# Patient Record
Sex: Female | Born: 1965
Health system: Southern US, Community
[De-identification: ages and names within clinical notes are randomized; demographics above are authoritative.]

## PROBLEM LIST (undated history)

## (undated) DIAGNOSIS — J309 Allergic rhinitis, unspecified: Secondary | ICD-10-CM

## (undated) DIAGNOSIS — R7303 Prediabetes: Secondary | ICD-10-CM

## (undated) DIAGNOSIS — E28319 Asymptomatic premature menopause: Secondary | ICD-10-CM

## (undated) DIAGNOSIS — L309 Dermatitis, unspecified: Secondary | ICD-10-CM

## (undated) DIAGNOSIS — M797 Fibromyalgia: Secondary | ICD-10-CM

## (undated) DIAGNOSIS — R209 Unspecified disturbances of skin sensation: Secondary | ICD-10-CM

## (undated) DIAGNOSIS — I83893 Varicose veins of bilateral lower extremities with other complications: Secondary | ICD-10-CM

## (undated) DIAGNOSIS — I10 Essential (primary) hypertension: Secondary | ICD-10-CM

## (undated) DIAGNOSIS — M1611 Unilateral primary osteoarthritis, right hip: Secondary | ICD-10-CM

## (undated) DIAGNOSIS — R200 Anesthesia of skin: Secondary | ICD-10-CM

## (undated) DIAGNOSIS — D649 Anemia, unspecified: Secondary | ICD-10-CM

## (undated) DIAGNOSIS — K219 Gastro-esophageal reflux disease without esophagitis: Secondary | ICD-10-CM

## (undated) DIAGNOSIS — E669 Obesity, unspecified: Secondary | ICD-10-CM

## (undated) DIAGNOSIS — Z5189 Encounter for other specified aftercare: Secondary | ICD-10-CM

## (undated) DIAGNOSIS — M1710 Unilateral primary osteoarthritis, unspecified knee: Secondary | ICD-10-CM

## (undated) DIAGNOSIS — M109 Gout, unspecified: Secondary | ICD-10-CM

## (undated) DIAGNOSIS — M1990 Unspecified osteoarthritis, unspecified site: Secondary | ICD-10-CM

## (undated) DIAGNOSIS — F419 Anxiety disorder, unspecified: Secondary | ICD-10-CM

## (undated) DIAGNOSIS — N62 Hypertrophy of breast: Secondary | ICD-10-CM

## (undated) DIAGNOSIS — Z780 Asymptomatic menopausal state: Secondary | ICD-10-CM

## (undated) DIAGNOSIS — N809 Endometriosis, unspecified: Secondary | ICD-10-CM

## (undated) DIAGNOSIS — R600 Localized edema: Secondary | ICD-10-CM

## (undated) DIAGNOSIS — L700 Acne vulgaris: Secondary | ICD-10-CM

## (undated) DIAGNOSIS — I8390 Asymptomatic varicose veins of unspecified lower extremity: Secondary | ICD-10-CM

## (undated) DIAGNOSIS — K648 Other hemorrhoids: Secondary | ICD-10-CM

## (undated) DIAGNOSIS — R000 Tachycardia, unspecified: Secondary | ICD-10-CM

## (undated) DIAGNOSIS — I8393 Asymptomatic varicose veins of bilateral lower extremities: Secondary | ICD-10-CM

## (undated) DIAGNOSIS — K5900 Constipation, unspecified: Secondary | ICD-10-CM

## (undated) HISTORY — DX: Fibromyalgia: M79.7

## (undated) HISTORY — DX: Encounter for other specified aftercare: Z51.89

## (undated) HISTORY — DX: Tachycardia, unspecified: R00.0

## (undated) HISTORY — DX: Anxiety disorder, unspecified: F41.9

## (undated) HISTORY — DX: Varicose veins of bilateral lower extremities with other complications: I83.893

## (undated) HISTORY — DX: Unilateral primary osteoarthritis, right hip: M16.11

## (undated) HISTORY — DX: Unspecified osteoarthritis, unspecified site: M19.90

## (undated) HISTORY — DX: Prediabetes: R73.03

## (undated) HISTORY — DX: Endometriosis, unspecified: N80.9

## (undated) HISTORY — DX: Other hemorrhoids: K64.8

## (undated) HISTORY — DX: Allergic rhinitis, unspecified: J30.9

## (undated) HISTORY — DX: Hypertrophy of breast: N62

## (undated) HISTORY — DX: Unilateral primary osteoarthritis, unspecified knee: M17.10

## (undated) HISTORY — DX: Asymptomatic varicose veins of unspecified lower extremity: I83.90

## (undated) HISTORY — DX: Obesity, unspecified: E66.9

## (undated) HISTORY — DX: Asymptomatic premature menopause: E28.319

## (undated) HISTORY — DX: Unspecified disturbances of skin sensation: R20.9

## (undated) HISTORY — DX: Asymptomatic varicose veins of bilateral lower extremities: I83.93

## (undated) HISTORY — DX: Gastro-esophageal reflux disease without esophagitis: K21.9

## (undated) HISTORY — DX: Asymptomatic menopausal state: Z78.0

## (undated) HISTORY — DX: Dermatitis, unspecified: L30.9

## (undated) HISTORY — DX: Acne vulgaris: L70.0

## (undated) HISTORY — DX: Gout, unspecified: M10.9

## (undated) HISTORY — DX: Localized edema: R60.0

---

## 2003-03-04 ENCOUNTER — Encounter: Admission: RE | Admit: 2003-03-04 | Discharge: 2003-03-04 | Payer: Self-pay | Admitting: Family Medicine

## 2003-03-16 ENCOUNTER — Ambulatory Visit (HOSPITAL_COMMUNITY): Admission: RE | Admit: 2003-03-16 | Discharge: 2003-03-16 | Payer: Self-pay | Admitting: Family Medicine

## 2005-07-04 DIAGNOSIS — E669 Obesity, unspecified: Secondary | ICD-10-CM

## 2005-07-04 HISTORY — DX: Obesity, unspecified: E66.9

## 2005-08-07 ENCOUNTER — Encounter: Admission: RE | Admit: 2005-08-07 | Discharge: 2005-08-07 | Payer: Self-pay | Admitting: Family Medicine

## 2006-05-22 ENCOUNTER — Inpatient Hospital Stay (HOSPITAL_COMMUNITY): Admission: RE | Admit: 2006-05-22 | Discharge: 2006-05-26 | Payer: Self-pay | Admitting: Orthopedic Surgery

## 2006-05-23 HISTORY — PX: TOTAL HIP ARTHROPLASTY: SHX124

## 2006-05-23 HISTORY — PX: JOINT REPLACEMENT: SHX530

## 2006-05-28 ENCOUNTER — Inpatient Hospital Stay (HOSPITAL_COMMUNITY): Admission: EM | Admit: 2006-05-28 | Discharge: 2006-06-01 | Payer: Self-pay | Admitting: Emergency Medicine

## 2006-11-11 ENCOUNTER — Emergency Department (HOSPITAL_COMMUNITY): Admission: EM | Admit: 2006-11-11 | Discharge: 2006-11-11 | Payer: Self-pay | Admitting: Emergency Medicine

## 2006-12-14 ENCOUNTER — Encounter: Admission: RE | Admit: 2006-12-14 | Discharge: 2006-12-14 | Payer: Self-pay | Admitting: Family Medicine

## 2008-04-22 ENCOUNTER — Encounter: Admission: RE | Admit: 2008-04-22 | Discharge: 2008-04-22 | Payer: Self-pay | Admitting: Family Medicine

## 2008-06-03 ENCOUNTER — Encounter: Admission: RE | Admit: 2008-06-03 | Discharge: 2008-06-03 | Payer: Self-pay | Admitting: Family Medicine

## 2009-07-08 ENCOUNTER — Encounter: Admission: RE | Admit: 2009-07-08 | Discharge: 2009-07-08 | Payer: Self-pay | Admitting: Obstetrics & Gynecology

## 2010-03-31 ENCOUNTER — Other Ambulatory Visit: Payer: Self-pay | Admitting: Family Medicine

## 2010-03-31 DIAGNOSIS — Z1231 Encounter for screening mammogram for malignant neoplasm of breast: Secondary | ICD-10-CM

## 2010-06-17 NOTE — H&P (Signed)
NAMESUNDAY, Laurie [name]               ACCOUNT NO.:  [id number]   MEDICAL RECORD NO.:  [id number]         PATIENT TYPE:  LINP   LOCATION:                               FACILITY:  [facility]   PHYSICIAN:  Madlyn Frankel. Charlann Boxer, M.D.  DATE OF BIRTH:  [birth year]   DATE OF ADMISSION:  05/22/2006  DATE OF DISCHARGE:                              HISTORY & PHYSICAL   PROCEDURE:  Right total hip replacement.   CHIEF COMPLAINT:  Right hip pain.   HISTORY OF PRESENT ILLNESS:  This is a 45 year old female who has  persistent progressive hip pain over the past year.  This has been  refractory to all conservative measures.  She has had medical clearance  for right total hip replacement by her primary care physician, Dr.  Maurice Small.   PAST MEDICAL HISTORY:  Healthy other than AVN.   PAST SURGICAL HISTORY:  None.   FAMILY HISTORY:  Hypertension, cancer, arthritis.   SOCIAL HISTORY:  She is single.  Primary caregiver will be her son,  Bryson Ha.   DRUG ALLERGIES:  SULFA causes swelling.   MEDICATIONS:  None.   REVIEW OF SYSTEMS:  RESPIRATORY:  Symptoms of an upper respiratory  congestion, to see PCP.  Otherwise, see HPI.   PHYSICAL EXAMINATION:  VITAL SIGNS:  Pulse 108, respirations 18, blood  pressure 110/90.  GENERAL:  She is awake, alert and oriented.  She does have a hoarse  voice.  NECK:  Supple.  No carotid bruits.  CHEST:  Lungs are clear to auscultation bilaterally, upper and lower  lobes.  BREASTS:  Deferred.  HEART:  She has a regular rate and rhythm without gallops, clicks or  rubs or murmurs.  Does have some mild tachycardia.  ABDOMEN:  Soft, nontender, nondistended.  Bowel sounds present.  GENITOURINARY:  Deferred.  EXTREMITIES:  Painful internal/external range of motion, increased groin  pain.  SKIN:  No cellulitis.  No skin breakdown.  NEUROLOGIC:  She has intact distal sensibilities.   Labs, EKG, chest x-ray are pending her presurgical testing.   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   PLAN OF ACTION:  Right total hip replacement May 22, 2006, at [facility] by surgeon Dr. Durene Romans.  Risks and complications were  discussed.  Questions were encouraged, answered and reviewed.     ______________________________  Yetta Glassman [address], [place]      Madlyn Frankel. Charlann Boxer, M.D.  Electronically Signed    BLM/MEDQ  D:  05/09/2006  T:  05/09/2006  Job:  [id number]   cc:   Gretta Arab. Valentina Lucks, M.D.  Fax: [phone]

## 2010-06-17 NOTE — Op Note (Signed)
Laurie [name], Laurie [name]               ACCOUNT NO.:  [id number]   MEDICAL RECORD NO.:  [id number]          PATIENT TYPE:  INP   LOCATION:  0005                         FACILITY:  [facility]   PHYSICIAN:  Madlyn Frankel. Charlann Boxer, M.D.  DATE OF BIRTH:  May 31, 1965   DATE OF PROCEDURE:  05/22/2006  DATE OF DISCHARGE:                               OPERATIVE REPORT   PREOPERATIVE DIAGNOSIS:  Right hip avascular necrosis with advanced  degenerative joint disease.   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   PROCEDURE:  Right total hip replacement.   COMPONENTS USED:  DePuy hip system with size 54 ASR cup, Trilock size 10  standard stem with a -1 ASR adapter.   SURGEON:  Madlyn Frankel. Charlann Boxer, M.D.   ASSISTANT:  Yetta Glassman. Mann, PA.   ANESTHESIA:  General.   BLOOD LOSS:  One liter.   COMPLICATIONS:  None.   DRAINS:  None.   INDICATIONS:  Laurie [name] is a 45 year old female who presented to the office  for evaluation of hip pain.  Radiographs revealed end-stage changes to  the her joint with significant femoral and acetabular osteophytes.  I  reviewed with her nonoperative interventions which had failed to provide  relief and she wished to proceed with surgical intervention of the form  of hip replacement.  We reviewed the risks of hip dislocation,  neurovascular injury, blood clot, need for revision surgery, as well as  bearing surfaces in a 45 year old female.  She is outside the  childbearing age so I felt that metal-on-metal bearing surface at this  point in time would give her the best chance to prevent dislocation as  well as to help with longevity of her components.  Consent was obtained.   PROCEDURE IN DETAIL:  The patient was brought to the operative theater.  Once adequate anesthesia and preoperative antibiotics, 2 grams of Ancef,  were administered.  The patient was positioned in the left lateral  decubitus position with the right side  up.  The right lower extremity  was then prescrubbed and prepped and draped in a sterile fashion.  A  lateral based incision was made for posterior approach to the hip.  The  iliotibial band and gluteus fascia was incised in line for posterior  approach.  The short external rotators were identified and taken down  separate from the posterior capsule.  Initially an L capsulotomy was  made to save the posterior leaflet, however, due to significant  contracture of her capsular tissues, this needed to be excised during  the case.  Nonetheless, the hip was dislocated.  Following hip exposure,  the anatomic landmarks identified and a neck osteotomy made based off  anatomic landmarks and preoperative templating based on her hip center  and the tip of the trochanter.  Following this, attention was first  directed to preparation of the femur.  Box osteotome set anteversion at  20 degrees followed by hand reaming to open up the canal followed by  irrigation.  I broached up to a size 10 which had an excellent secure  fit.  Given the fact that the broach was held up a little bit higher than the  neck cut, I chose to use a -1 adapter on the trial.  This would come  later.   The broach was removed, the femoral canal packed with a sponge and  attention directed to the acetabulum.  Acetabular exposure was obtained  with normal retractors, albeit somewhat of a challenge due to the size  of her thigh and muscles, nonetheless, it was accomplished without  difficulty without too much difficulty.  Laminectomy was carried out.  Reaming commenced with a 43 reamer down to the medial wall and  sequentially I reamed up to 51 reamer.  At this point, there was still  very sclerotic acetabulum and it was not until I used a 52 reamer and  the 53 reamer that I actually got some good cancellous bleeding bone.  Once I had this bleeding bone, it was irrigated and packed and the final  54 ASR cup was then impacted  using the curved handle.  Cup position was  forward flexed about 20 degrees, it was beneath the anterior wall  anteriorly and was at about 35 degrees abduction in this female with  larger hips, this was in appropriate position.  It was very well seated  as noted from previous reamings.   Attention was now directed to a trial.  The trial 10 broach was placed.  Initially I used a lateralized offset neck length and the hip was  reduced.  We had a significant of difficult time dislocating the hip  after this point, to me indicating that the iliotibial band was too  tight despite her exam not indicating that.  Nonetheless we then trialed  with a standard with a -1 adapter and a 47 ball.  The hip was very  stable throughout the range of motion with no evidence of impingement.  The leg lengths compared to preoperative positioning with the heel on  the knee indicated that the leg lengths were not lengthened.  There was  no evidence of any instability.  The trial components were then removed  and the final 10 standard stem was then impacted to the level where the  broach was.  Given this, I used the -1 adapter and the 47 final ASR  ball.  The combination of these were impacted onto a clean and dry  trunnion and the hip reduced.  The hip was irrigated throughout the case  and again at this point, I tried to identify any obvious bleeding.  There was none, it was simply muscle oozing and bone bleeding.  I used  10 mL of FloSeal into this posterior muscle and capsular region.  Due to  the fact that a capsulotomy was performed, there was no capsular repair  to be done.  The iliotibial band was reapproximated using #1 Vicryl, the  gluteal fascia with a running #1 Vicryl.  The remaining wound was closed  with 2-0 Vicryl and 4-0 running Monocryl.  The hip was then cleaned,  dried and dressed sterilely with Steri-Strips, dressing sponges and a MethylPlex dressing.  The patient was then brought to the  recovery room  in stable condition.  When she was awoken and supine her leg lengths  were equal on the table and was moving her lower extremities.      Madlyn Frankel Charlann Boxer, M.D.  Electronically Signed     MDO/MEDQ  D:  05/22/2006  T:  05/22/2006  Job:  [id number]

## 2010-06-17 NOTE — H&P (Signed)
Laurie [name], JENTSCH               ACCOUNT NO.:  [id number]   MEDICAL RECORD NO.:  [id number]          PATIENT TYPE:  EMS   LOCATION:  ED                           FACILITY:  [facility]   PHYSICIAN:  Hollice Espy, M.D.DATE OF BIRTH:  Dec 01, 1965   DATE OF ADMISSION:  05/28/2006  DATE OF DISCHARGE:                              HISTORY & PHYSICAL   ATTENDING PHYSICIAN:  Andres Shad. Rudean Curt, M.D.   PRIMARY CARE PHYSICIAN:  Gretta Arab. Valentina Lucks, M.D.   [facility]   CHIEF COMPLAINT:  Abdominal pain.   HISTORY OF PRESENT ILLNESS:  The patient is a 45 year old African  American female with a past medical history of degenerative disease of  the right hip who underwent a right total hip replacement secondary to  avascular necrosis on May 22, 2006.  The patient had little  complications during the procedure and was sent home on Lovenox and  Coumadin as well as pain medication.  She initially did well for several  days, although she had minimal bowel movements and then starting  Saturday, she started having severe generalized abdominal pain,  worsening, unable to keep anything down, to the point where she finally  could not take it anymore.  She started having episodes of diarrhea  today and with that came into the emergency room.  The patient underwent  lab work which was essentially unremarkable, except for a low potassium  level of 3.2.  She underwent an acute abdominal series that showed  findings suspicious for small bowel obstruction, adynamic ileus less  likely.  With these findings, it was felt the patient needed to come in  for further evaluation.  The patient so far was given medication for  pain and nausea and started to feel somewhat better.   REVIEW OF SYSTEMS:  She denies any headache or vision changes,  dysphagia, chest pain, palpitations, shortness of breath, wheezing,  coughing.  She complains of generalized abdominal pain, a little more so  on the  left hand side.  She has not had a regular bowel movement in some  time.  She complained of diarrhea x2 episodes today.  She states she has  long time problems with constipation.  She denies any focal extremity  numbness, weakness, or pain.  Review of systems is otherwise negative.   PAST MEDICAL HISTORY:  1. DJD of the right hip.  2. Chronic constipation.  3. She has had no previous history of abdominal surgeries.  No      hysterectomy.  No hernia repair.  No gallbladder or appendix      removal.   MEDICATIONS:  Before her surgery, she was not on any medications.  After  her hip surgery, she was put on:  1. Robaxin 500 daily p.r.n.  2. Vicodin p.r.n.  3. Lovenox 40 subcutaneous daily.  4. Iron.  5. Coumadin.   ALLERGIES:  SULFA.   SOCIAL HISTORY:  She denies any tobacco, alcohol, or drug use.   FAMILY HISTORY:  Noncontributory.   PHYSICAL EXAMINATION:  VITAL SIGNS:  On admission, temp 98.3, heart rate  102,  blood pressure 148/92, respirations 20, O2 sat 99% on room air.  GENERAL:  The patient is alert and oriented x3.  She is in some  generalized distress due to abdominal pain.  HEENT:  Normocephalic atraumatic.  Mucous membranes are dry.  She has no  carotid bruits.  HEART:  Regular rhythm.  Mild tachycardia.  LUNGS:  Clear to auscultation bilaterally.  ABDOMEN:  Soft.  Mild distention.  Hypoactive bowel sounds.  No focal  areas of tenderness.  She has just generalized tenderness.  EXTREMITIES:  Show no clubbing, cyanosis, or edema.   LABORATORY:  X-rays as per HPI.  White count 7.9, H&H 11 and 32, MCV of  81, platelet count 396, no shift.  Sodium 136, potassium 3.2, chloride  98, bicarb 26, BUN 9, creatinine 0.7, glucose 108.  Albumin is notable  at 2.5.  Her UA shows a small amount of bilirubin, 80 ketones, total  protein 30, but only 0-2 white cells.   ASSESSMENT/PLAN:  1. Suspected small bowel obstruction, postoperative maybe secondary to      narcotics.  We will  start by getting a surgery consultation, CT of      the abdomen and pelvis.  Place an NG tube, intravenous pain and      nausea control.  2. Protein calorie malnutrition secondary to the patient being unable      to take any oral input for the last several days.  We will start      with hydration.  If this does not quickly resolve, we will plan to      place a PICC line and get TPN.  3. Status post hip repair.  Currently, the patient is stable.  We will      continue Lovenox direction.  Otherwise stable.  4. Chronic constipation worsened by this obstruction.      Hollice Espy, M.D.  Electronically Signed     SKK/MEDQ  D:  05/28/2006  T:  05/28/2006  Job:  [id number]   cc:   Gretta Arab. Valentina Lucks, M.D.  Fax: [phone]   Madlyn Frankel. Charlann Boxer, M.D.  Fax: [phone]   [facility]

## 2010-06-17 NOTE — Consult Note (Signed)
Laurie [name], Laurie [name]               ACCOUNT NO.:  [id number]   MEDICAL RECORD NO.:  [id number]          PATIENT TYPE:  INP   LOCATION:  1329                         [facility]ACILITY:  [facility]   PHYSICIAN:  Angelia Mould. Derrell Lolling, M.D.DATE OF BIRTH:  [birth year]   DATE OF CONSULTATION:  05/28/2006  DATE OF DISCHARGE:                                 CONSULTATION   REASON FOR CONSULTATION:  Evaluate abdominal distension.   HISTORY OF PRESENT ILLNESS:  This is a 45 year old black female who has  a history compatible with chronic constipation in that she has  infrequent bowel movements and has to take laxatives and Fleet's enemas  off and on for the past several months.  It is notable that she  underwent a right total knee replacement on May 22, 2006 and was  discharged home on May 26, 2006.  She states that for 3-4 days her  abdomen has been somewhat hard and painful diffusely with crampy pain  diffusely.  After going home she developed lower recurrent nausea and  vomiting.  She says what she vomits is a little bit black in color but  she has been taking iron tablets.  She has not seen any blood.  She came  to the emergency room today.  She states that she has had four or five  nonbloody loose stools today.   In the emergency room she had abdominal x-rays which showed dilated  loops of small bowel especially in the left upper quadrant and with  elevation of the left hemidiaphragm.  There is no evidence of free air  or thickening of the bowel, however.  She is being admitted by the [facility]  hospitalists and I was asked to see the patient in consultation to see  if I thought she had a bowel obstruction.   PAST MEDICAL HISTORY:  Right total knee replacement 6 days ago for  avascular necrosis.  Otherwise she has had no medical problems and no  surgical problems in the past.  Specifically she has had no abdominal  surgery.   MEDICATIONS:  Multivitamins, iron, Tylenol, Vicodin.   ALLERGIES:   SULFA.   FAMILY HISTORY:  Mother deceased of leukemia.  Father living, has  hypertension.  One brother living with hypertension.   SOCIAL HISTORY:  She is single.  Has had one pregnancy and one delivery.  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  Denies the use of alcohol or  tobacco.   REVIEW OF SYSTEMS:  A 15 system review of systems is performed and is  noncontributory except as described above.   PHYSICAL EXAMINATION:  A pleasant, but somewhat frustrated appearing  middle-aged black female in mild distress.  Temperature 98.3, blood  pressure 148/92, pulse 102, respirations 20, oxygen saturation 99% on  room air.  HEENT:  Eyes:  Sclerae clear.  Extraocular movements intact.  Ears,  nose, mouth and throat, nose, tongue and oropharynx without gross  lesions.  NECK:  Supple, nontender.  No mass.  No jugular distension.  LUNGS:  Clear to auscultation.  NO chest wall tenderness.  HEART:  Regular rate  and rhythm.  No murmur.  Radial, femoral and  posterior pulses are palpable.  No peripheral edema.  BREASTS:  Not examined.  ABDOMEN:  1-2+ distended, quite soft, however, with minimal tenderness.  Bowel sounds are hypoactive.  There are no scars on the abdomen.  There  is no mass palpable.  There is no hernia of the abdomen or inguinal  areas.  The liver and spleen are not enlarged.  She is somewhat  distended and a little bit tympanitic in the upper abdomen, however.  She may be a little bit more tender on the left side.  EXTREMITIES:  She moves all four extremities well without pain or  deformity.  NEUROLOGIC:  No gross motor or sensory deficits.   ADMISSION DATA:  Abdominal x-rays suggesting either small bowel  obstruction versus ileus as described above.  White blood cell count  7900 with normal differential.  Hemoglobin 11.0, potassium 3.2, BUN 9.  Liver function tests normal.  Urinalysis shows 3 to 6 RBCs per high-  power field.   ASSESSMENT:  1. Abdominal pain and  distension with vomiting and diarrhea.  This may      be a partial small-bowel obstruction or some type of motility      disorder such as an ileus due to chronic constipation and      narcotics.  2. Hypokalemia, this may contribute to an ileus.  3. Status post right total knee replacement.   PLAN:  I agree that the patient should be admitted.  Nasogastric tube  should be inserted. A CT scan of the abdomen and pelvis should be done  now.  I have ordered potassium replacement in her IVs to hopefully  correct her hypokalemia.      Angelia Mould. Derrell Lolling, M.D.  Electronically Signed     HMI/MEDQ  D:  05/28/2006  T:  05/29/2006  Job:  [id number]   cc:   Hollice Espy, M.D.   Madlyn Frankel Charlann Boxer, M.D.  Fax: [phone]   Gretta Arab Valentina Lucks, M.D.  Fax: [phone]

## 2010-06-17 NOTE — Discharge Summary (Signed)
NAMEDINORA, HEMM               ACCOUNT NO.:  [id number]   MEDICAL RECORD NO.:  [id number]          PATIENT TYPE:  INP   LOCATION:  1529                         [facility]ACILITY:  [facility]   PHYSICIAN:  Madlyn Frankel. Charlann Boxer, M.D.  DATE OF BIRTH:  [birth year]   DATE OF ADMISSION:  05/22/2006  DATE OF DISCHARGE:  05/26/2006                               DISCHARGE SUMMARY   ADMITTING DIAGNOSES:  1. Avascular necrosis, right hip.  2. Osteoarthritis.   DISCHARGE DIAGNOSES:  1. Avascular necrosis.  2. Osteoarthritis.  3. Post-operative acute blood loss anemia.  4. Post-operative hypokalemia   CONSULTANTS:  None.   PROCEDURE:  Right total hip replacement.   SURGEON:  Dr. Durene Romans.   COMPONENTS:  Was a metal on metal implant.   ASSISTANT:  Dwyane Luo, PA-C.   HISTORY OF PRESENT ILLNESS:  Mrs. Reuel Boom is a 45 year-old female who has  had persistent progressive hip pain over the past year.  Radiographic  evaluation has revealed AVN with subsequent osteoarthritis.  It is  refractory to all conservative treatments.  Medical pre-assessment was  performed by Dr. Maurice Small.   PRE-SURGICAL LABS:  1. Hematocrit 37.9.  Post-op day #1:  Hematocrit 26; post-op day #2      hematocrit 23.7; post-op day #3, 21.2 transfuse 2 units packed red      blood cells prior to discharge.  On post-op day #4, hematocrit back      up to 28.6.  2. Pre-admission coagulation normal.  3. Pre-admission chemistries all within normal limits.  Post-op day      #1, glucose 132; post-op day #2 sodium 133; potassium 3.2, glucose      113; post-op day #3 sodium 136, potassium 2.8, glucose 108.      Represented some post-operative hypokalemia.  Will plan to add post-      operative hypokalemia to discharge diagnosis.  4. Pre-admission GI workup showed albumin 3.3.  5. Pre-admission UA showed some moderate hemoglobin, otherwise normal.      Blood type and cross O+, transfused two packed units on the 25th.   HOSPITAL  COURSE:  The patient underwent total replacement, tolerated  procedure well, was admitted to orthopedic floor.  She remained  neuromuscularly and vascularly intact throughout.  Pain was well-  controlled.  Dressing was changed after post-op day #1 on a daily basis.  Wounds showed no active drainage throughout.  She progressed nicely with  physical therapy, was able to walk more than 100 feet prior to  discharge.  Post-op day #1, she was doing extremely well, no  complications.  Post-op day #2, continued to do well.  Started on post-  op day #3 waiting on lab results.  Low hematocrit.  Had to go ahead and  transfuse her, give her some medicine to keep her fever down.  She did  well with the transfusion.  Next day at followup, she was stabilized  hematocrit, and orthopedically she was stable, walking well and doing  well.  She was provided with home health care services, including  Lovenox administration and teaching, and a  rolling walker and 3 in 1.   DISCHARGE DISPOSITION:  Stable, improved home health care PT and  Lovenox.   DISCHARGE DIET:  No restrictions.   WOUND CARE:  Keep dry, change dressing daily.   DISCHARGE ACTIVITIES:  Weightbearing as tolerated with the use of a  rolling walker.   DISCHARGE SPECIAL INSTRUCTIONS:  1. Follow up with Dr. Charlann Boxer, [phone], in two weeks.  2. If develops acute shortness of breath, severe calf pain, please      call emergency services immediately.  If develop fever greater than      101, non-response to Tylenol, call office.   DISCHARGE MEDICATIONS:  Home meds none.  1. Lovenox 40 mg subcu q. 24  x11 days.  2. Vicodin 5-25 one to two p.o. q. 4-6 p.r.n. pain.  3. Robaxin 500 mg one p.o. q.6  muscle spasm pain.  4. Colace 100 mg one p.o. b.i.d. constipation.  5. MiraLax 17 grams, one scoop in 8 ounces of water daily      constipation.  6. Enteric-coated aspirin 325 mg one p.o. daily after Lovenox      completed.  7. Iron 325 mg one p.o.  t.i.d. x3 weeks.     ______________________________  Yetta Glassman [address], [place]      Madlyn Frankel. Charlann Boxer, M.D.  Electronically Signed    BLM/MEDQ  D:  06/20/2006  T:  06/20/2006  Job:  [id number]

## 2010-06-17 NOTE — Consult Note (Signed)
Laurie [name], [name]               ACCOUNT NO.:  [id number]   MEDICAL RECORD NO.:  [id number]          PATIENT TYPE:  INP   LOCATION:  1329                         FACILITY:  [facility]   PHYSICIAN:  Graylin Shiver, M.D.   DATE OF BIRTH:  1965/02/24   DATE OF CONSULTATION:  05/29/2006  DATE OF DISCHARGE:                                 CONSULTATION   REASON FOR CONSULTATION:  The patient is a 45 year old black female,  status post right total hip replacement on May 22, 2006. She was  discharged home on May 26, 2006.  She states that the day of discharge  she had some mild abdominal cramping. The next day, she had more  abdominal cramping which was more severe and developed vomiting and  diarrhea.  She went home on Lovenox and Coumadin as well as iron. Some  of the diarrheal stools were black in color, which she attributed to the  iron she was taking. She did not see any bright red blood. She was  readmitted to the hospital on May 28, 2006 with her ongoing GI  complaints.  An abdominal x-ray raised the question of a small-bowel  obstruction.  Surgery was consulted, and they evaluated her.  They  obtained a CT scan of the abdomen. The CT showed mild wall thickening of  the small bowel in the left abdomen, lower midabdomen, and left side of  the pelvis. It was felt by the radiologist to probably be infectious in  etiology. Surgery has reassessed her and does not feel that there is a  surgical problem. GI was consulted.   PAST HISTORY:  1. Chronic constipation.  2. Degenerative joint disease, status post hip surgery, as mentioned      above.   ALLERGIES:  SULFA.   MEDICATIONS PRIOR TO ADMISSION:  Robaxin, Vicodin, Lovenox, iron,  Coumadin.   SOCIAL HISTORY:  She does not smoke or drink alcohol.   REVIEW OF SYSTEMS:  Not complaining of chest pain or shortness of breath  GI symptoms as above.   PHYSICAL EXAMINATION:  GENERAL:  She does not appear in any acute  distress at this  time.  VITAL SIGNS:  Stable.  She is afebrile.  HEENT:  She is not icteric.  NECK:  Supple.  HEART:  Regular rhythm.  LUNGS:  Clear.  ABDOMEN:  Bowel sounds are normal sounding.  It is soft. There is some  tenderness on the left side of the abdomen, but no rebound or guarding.   LABORATORIES:  WBC 9100, H&H 9.5 and 28.  Potassium is 3.2.   IMPRESSION:  Probable infectious versus inflammatory enteritis.   PLAN:  Recommend supportive care at this time with hydration and  replacement of potassium.  Check stools for pathogens.   This may be a viral enteritis   We will follow her clinically.           ______________________________  Graylin Shiver, M.D.     SFG/MEDQ  D:  05/29/2006  T:  05/30/2006  Job:  [id number]   cc:   Andres Shad. Rudean Curt, MD   Consuella Lose  Emelia Salisbury, M.D.  Fax: [phone]   [facility]   Madlyn Frankel. Charlann Boxer, M.D.  Fax: [phone]

## 2010-06-17 NOTE — Discharge Summary (Signed)
Laurie [name], Laurie [name]               ACCOUNT NO.:  [id number]   MEDICAL RECORD NO.:  [id number]          PATIENT TYPE:  INP   LOCATION:  1329                         FACILITY:  [facility]   PHYSICIAN:  Andres Shad. Rudean Curt, MD     DATE OF BIRTH:  06-15-1965   DATE OF ADMISSION:  05/28/2006  DATE OF DISCHARGE:  06/01/2006                               DISCHARGE SUMMARY   DISCHARGE DIAGNOSIS:  Infectious enterocolitis.   PRIMARY CARE PROVIDER:  Gretta Arab. Valentina Lucks, M.D.   CONSULTANTS:  1. Angelia Mould. Derrell Lolling, M.D. with [facility].  2. Llana Aliment. Randa Evens, M.D. with [facility].   DISCHARGE MEDICATIONS:  1. Donnatal two tabs 3x daily.  2. Robaxin 500 mg as needed.  3. Vicodin as needed.  4. Iron sulfate.  5. Lovenox 40 mg subcutaneously daily.   ALLERGIES:  SULFA.   SUMMARY OF HOSPITALIZATION:  Ms. Laurie [name] is a 45 year old female who  presented to the emergency department on April 28 complaining of  abdominal pain for approximately 2 days.  By the time of admission she  had been unable to keep any food down because of vomiting; and also  began having episodes of diarrhea.  On physical exam she had a mild  distention and generalized tenderness.  A plain film of the abdomen was  is vicious for a small bowel obstruction; and because of this general  surgery was consulted.  A CT scan of the abdomen was performed and this  study revealed wall thickening in the small bowel.  This was consistent  with dilated small bowel and colon with air fluid levels.  It was felt  that this was more likely to be an infectious or inflammatory  enterocolitis.  Additionally, the rectosigmoid: was filled with fluid  and there was an area of mild focal wall thickening.  At this point  gastroenterology was consulted, who also agreed that this was most  likely an infectious enterocolitis.  The patient was treated with  intravenous fluids and a very low residue diet.  Donnatal was given for  relief of  cramps; and by the day of discharge the patient felt much  better.   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  They were no complications related  to the surgery during this hospitalization.  She remained on her pain  medications and low-molecular weight heparin, as before.   ASSESSMENT AND PLAN:  Infectious enterocolitis.  The patient has been  instructed to continue a low-residue diet.  She will take Donnatal two  tablets 3x daily as an outpatient; and follow up with Dr. Randa Evens of  gastroenterology in approximately 2 weeks.      Andres Shad. Rudean Curt, MD  Electronically Signed     PML/MEDQ  D:  06/01/2006  T:  06/01/2006  Job:  [id number]

## 2010-07-11 ENCOUNTER — Ambulatory Visit: Payer: Self-pay

## 2010-07-19 ENCOUNTER — Ambulatory Visit
Admission: RE | Admit: 2010-07-19 | Discharge: 2010-07-19 | Disposition: A | Discharge status: Home / Self Care | Payer: Private Health Insurance - Indemnity | Source: Ambulatory Visit | Attending: Family Medicine | Admitting: Family Medicine

## 2010-07-19 ENCOUNTER — Ambulatory Visit: Payer: Self-pay

## 2010-07-19 DIAGNOSIS — Z1231 Encounter for screening mammogram for malignant neoplasm of breast: Secondary | ICD-10-CM

## 2010-07-21 ENCOUNTER — Other Ambulatory Visit: Payer: Self-pay | Admitting: Family Medicine

## 2010-07-21 DIAGNOSIS — R928 Other abnormal and inconclusive findings on diagnostic imaging of breast: Secondary | ICD-10-CM

## 2010-07-26 ENCOUNTER — Ambulatory Visit
Admission: RE | Admit: 2010-07-26 | Discharge: 2010-07-26 | Disposition: A | Discharge status: Home / Self Care | Payer: Private Health Insurance - Indemnity | Source: Ambulatory Visit | Attending: Family Medicine | Admitting: Family Medicine

## 2010-07-26 DIAGNOSIS — R928 Other abnormal and inconclusive findings on diagnostic imaging of breast: Secondary | ICD-10-CM

## 2010-11-08 DIAGNOSIS — L309 Dermatitis, unspecified: Secondary | ICD-10-CM

## 2010-11-08 HISTORY — DX: Dermatitis, unspecified: L30.9

## 2010-12-31 ENCOUNTER — Encounter: Payer: Self-pay | Admitting: Emergency Medicine

## 2010-12-31 ENCOUNTER — Emergency Department (HOSPITAL_COMMUNITY)
Admission: EM | Admit: 2010-12-31 | Discharge: 2010-12-31 | Disposition: A | Discharge status: Home / Self Care | Payer: Private Health Insurance - Indemnity | Attending: Emergency Medicine | Admitting: Emergency Medicine

## 2010-12-31 ENCOUNTER — Emergency Department (HOSPITAL_COMMUNITY): Payer: Private Health Insurance - Indemnity

## 2010-12-31 DIAGNOSIS — R0789 Other chest pain: Secondary | ICD-10-CM | POA: Insufficient documentation

## 2010-12-31 DIAGNOSIS — R000 Tachycardia, unspecified: Secondary | ICD-10-CM | POA: Insufficient documentation

## 2010-12-31 LAB — POCT I-STAT, CHEM 8
BUN: 19 mg/dL (ref 6–23)
Calcium, Ion: 1.25 mmol/L (ref 1.12–1.32)
Chloride: 104 mEq/L (ref 96–112)
Creatinine, Ser: 1 mg/dL (ref 0.50–1.10)
Glucose, Bld: 98 mg/dL (ref 70–99)
HCT: 40 % (ref 36.0–46.0)
Hemoglobin: 13.6 g/dL (ref 12.0–15.0)
Potassium: 3.7 mEq/L (ref 3.5–5.1)
Sodium: 139 mEq/L (ref 135–145)
TCO2: 25 mmol/L (ref 0–100)

## 2010-12-31 LAB — DIFFERENTIAL
Basophils Absolute: 0.1 10*3/uL (ref 0.0–0.1)
Basophils Relative: 1 % (ref 0–1)
Eosinophils Absolute: 0.4 10*3/uL (ref 0.0–0.7)
Eosinophils Relative: 3 % (ref 0–5)
Lymphocytes Relative: 30 % (ref 12–46)
Lymphs Abs: 3.8 10*3/uL (ref 0.7–4.0)
Monocytes Absolute: 0.9 10*3/uL (ref 0.1–1.0)
Monocytes Relative: 7 % (ref 3–12)
Neutro Abs: 7.3 10*3/uL (ref 1.7–7.7)
Neutrophils Relative %: 59 % (ref 43–77)

## 2010-12-31 LAB — URINALYSIS, ROUTINE W REFLEX MICROSCOPIC
Bilirubin Urine: NEGATIVE
Bilirubin Urine: NEGATIVE
Glucose, UA: NEGATIVE mg/dL
Glucose, UA: NEGATIVE mg/dL
Hgb urine dipstick: NEGATIVE
Ketones, ur: NEGATIVE mg/dL
Ketones, ur: NEGATIVE mg/dL
Leukocytes, UA: NEGATIVE
Leukocytes, UA: NEGATIVE
Nitrite: NEGATIVE
Nitrite: NEGATIVE
Protein, ur: NEGATIVE mg/dL
Protein, ur: NEGATIVE mg/dL
Specific Gravity, Urine: 1.023 (ref 1.005–1.030)
Specific Gravity, Urine: 1.018 (ref 1.005–1.030)
Urobilinogen, UA: 0.2 mg/dL (ref 0.0–1.0)
Urobilinogen, UA: 0.2 mg/dL (ref 0.0–1.0)
pH: 6 (ref 5.0–8.0)
pH: 6 (ref 5.0–8.0)

## 2010-12-31 LAB — CARDIAC PANEL(CRET KIN+CKTOT+MB+TROPI)
CK, MB: 2.9 ng/mL (ref 0.3–4.0)
Relative Index: INVALID (ref 0.0–2.5)
Total CK: 99 U/L (ref 7–177)
Troponin I: <0.3 ng/mL (ref <0.30)

## 2010-12-31 LAB — CBC
HCT: 37.9 % (ref 36.0–46.0)
Hemoglobin: 12.6 g/dL (ref 12.0–15.0)
MCH: 26.6 pg (ref 26.0–34.0)
MCHC: 33.2 g/dL (ref 30.0–36.0)
MCV: 80 fL (ref 78.0–100.0)
Platelets: 270 10*3/uL (ref 150–400)
RBC: 4.74 MIL/uL (ref 3.87–5.11)
RDW: 14 % (ref 11.5–15.5)
WBC: 12.5 10*3/uL — ABNORMAL HIGH (ref 4.0–10.5)

## 2010-12-31 LAB — D-DIMER, QUANTITATIVE: D-Dimer, Quant: 0.37 ug/mL-FEU (ref 0.00–0.48)

## 2010-12-31 LAB — URINE MICROSCOPIC-ADD ON

## 2010-12-31 NOTE — ED Notes (Signed)
Pt reports left sided chest pain onset Thursday. Pt denies any other symptoms.

## 2010-12-31 NOTE — ED Provider Notes (Signed)
History     CSN: [id number] Arrival date & time: 12/31/2010 10:36 AM   First MD Initiated Contact with Patient 12/31/10 1157      Chief Complaint  Patient presents with  . Chest Pain    (Consider location/radiation/quality/duration/timing/severity/associated sxs/prior treatment) Patient is a 45 y.o. female presenting with chest pain. The history is provided by the patient.  Chest Pain Episode onset: 3 days ago. Chest pain occurs constantly. The chest pain is unchanged. Associated with: Nothing. At its most intense, the pain is at 4/10. The pain is currently at 4/10. The severity of the pain is moderate. The quality of the pain is described as sharp and tightness. The pain does not radiate. Exacerbated by: Deep breathing, eating, palpation, exertion did not change the pain. Pertinent negatives for primary symptoms include no fever, no fatigue, no syncope, no shortness of breath, no cough, no wheezing, no palpitations, no abdominal pain, no nausea, no vomiting and no dizziness.  Pertinent negatives for associated symptoms include no lower extremity edema, no near-syncope and no weakness. She tried nothing for the symptoms. Risk factors include no known risk factors.  Pertinent negatives for past medical history include no CAD, no cancer, no diabetes, no DVT, no hyperlipidemia and no hypertension.  Her family medical history is significant for diabetes in family.     History reviewed. No pertinent past medical history.  Past Surgical History  Procedure Date  . Total hip arthroplasty     History reviewed. No pertinent family history.  History  Substance Use Topics  . Smoking status: Never Smoker   . Smokeless tobacco: Never Used  . Alcohol Use: No    OB History    Grav Para Term Preterm Abortions TAB SAB Ect Mult Living                  Review of Systems  Constitutional: Negative for fever and fatigue.  Respiratory: Negative for cough, shortness of breath and wheezing.     Cardiovascular: Positive for chest pain. Negative for palpitations, syncope and near-syncope.  Gastrointestinal: Negative for nausea, vomiting and abdominal pain.  Neurological: Negative for dizziness and weakness.  All other systems reviewed and are negative.    Allergies  Sulfa antibiotics  Home Medications   Current Outpatient Rx  Name Route Sig Dispense Refill  . CALCIUM CARBONATE 600 MG PO TABS Oral Take 600 mg by mouth daily.      Marland Kitchen FERROUS FUM-IRON POLYSACCH 162-115.2 MG PO CAPS Oral Take 1 capsule by mouth daily with breakfast.      . HYDROCORTISONE 1 % EX CREA Topical Apply 1 application topically daily as needed. For skin irritation/rash     . IBUPROFEN 200 MG PO TABS Oral Take 800 mg by mouth every 6 (six) hours as needed. For pain     . THERA M PLUS PO TABS Oral Take 1 tablet by mouth daily.      Marland Kitchen VITAMIN D (ERGOCALCIFEROL) 50000 UNITS PO CAPS Oral Take 50,000 Units by mouth every 7 (seven) days. Takes on sundays       BP 137/96  Pulse 111  Temp(Src) 98.3 F (36.8 C) (Oral)  Resp 20  SpO2 100%  LMP 11/29/2010  Physical Exam  Nursing note and vitals reviewed. Constitutional: She is oriented to person, place, and time. She appears well-developed and well-nourished. No distress.  HENT:  Head: Normocephalic and atraumatic.  Eyes: EOM are normal. Pupils are equal, round, and reactive to light.  Neck: Normal range  of motion. Neck supple.  Cardiovascular: Regular rhythm, normal heart sounds and intact distal pulses.  Tachycardia present.  Exam reveals no friction rub.   No murmur heard. Pulmonary/Chest: Effort normal and breath sounds normal. She has no wheezes. She has no rales. She exhibits no tenderness.  Abdominal: Soft. Bowel sounds are normal. She exhibits no distension. There is no tenderness. There is no rebound and no guarding.  Musculoskeletal: Normal range of motion. She exhibits no tenderness.       No edema  Neurological: She is alert and oriented to  person, place, and time. No cranial nerve deficit.  Skin: Skin is warm and dry. No rash noted.  Psychiatric: She has a normal mood and affect. Her behavior is normal.    ED Course  Procedures (including critical care time)   Labs Reviewed  CBC  DIFFERENTIAL  I-STAT, CHEM 8  URINALYSIS, ROUTINE W REFLEX MICROSCOPIC  POCT PREGNANCY, URINE  CARDIAC PANEL(CRET KIN+CKTOT+MB+TROPI)  D-DIMER, QUANTITATIVE   No results found.   Date: 12/31/2010  Rate: 101  Rhythm: sinus tachycardia  QRS Axis: normal  Intervals: normal  ST/T Wave abnormalities: normal  Conduction Disutrbances:none  Narrative Interpretation:   Old EKG Reviewed: none available    No diagnosis found.    MDM   Patient here with a complaint of chest pain since Thursday. Pain is a sharp quality in the left upper portion of her chest. There are new associated symptoms such as shortness of breath, fever, cough, nausea, fatigue. Patient is a TIMI 0 with no cardiac risk factors. She is a low risk well's criteria, but PERC + due to sinus tachycardia. Patient has not take OCPs  Normal exam. EKG was sinus tachycardia but otherwise normal. Will get chest x-ray CBC i-STAT and d-dimer.   1:52 PM All labs within normal limits. X-ray negative for any acute pathology. Will DC home and have follow up with PCP.    Gwyneth Sprout, MD 12/31/10 1353

## 2010-12-31 NOTE — ED Notes (Signed)
Patient was not in triage waiting or main waiting when RN called for her.

## 2011-08-02 ENCOUNTER — Other Ambulatory Visit: Payer: Self-pay | Admitting: Obstetrics & Gynecology

## 2011-08-02 DIAGNOSIS — Z1231 Encounter for screening mammogram for malignant neoplasm of breast: Secondary | ICD-10-CM

## 2011-08-16 ENCOUNTER — Ambulatory Visit
Admission: RE | Admit: 2011-08-16 | Discharge: 2011-08-16 | Disposition: A | Discharge status: Home / Self Care | Payer: Private Health Insurance - Indemnity | Source: Ambulatory Visit | Attending: Obstetrics & Gynecology | Admitting: Obstetrics & Gynecology

## 2011-08-16 DIAGNOSIS — Z1231 Encounter for screening mammogram for malignant neoplasm of breast: Secondary | ICD-10-CM

## 2011-11-14 DIAGNOSIS — IMO0001 Reserved for inherently not codable concepts without codable children: Secondary | ICD-10-CM

## 2011-11-14 DIAGNOSIS — R7303 Prediabetes: Secondary | ICD-10-CM

## 2011-11-14 DIAGNOSIS — Z780 Asymptomatic menopausal state: Secondary | ICD-10-CM | POA: Insufficient documentation

## 2011-11-14 HISTORY — DX: Prediabetes: R73.03

## 2011-11-14 HISTORY — DX: Reserved for inherently not codable concepts without codable children: IMO0001

## 2012-01-31 HISTORY — PX: LIPOSUCTION: SHX10

## 2012-02-08 DIAGNOSIS — M109 Gout, unspecified: Secondary | ICD-10-CM

## 2012-02-08 HISTORY — DX: Gout, unspecified: M10.9

## 2012-07-01 ENCOUNTER — Emergency Department (HOSPITAL_BASED_OUTPATIENT_CLINIC_OR_DEPARTMENT_OTHER)
Admission: EM | Admit: 2012-07-01 | Discharge: 2012-07-01 | Disposition: A | Discharge status: Home / Self Care | Payer: Managed Care, Other (non HMO) | Attending: Emergency Medicine | Admitting: Emergency Medicine

## 2012-07-01 ENCOUNTER — Encounter (HOSPITAL_BASED_OUTPATIENT_CLINIC_OR_DEPARTMENT_OTHER): Payer: Self-pay | Admitting: Radiology

## 2012-07-01 ENCOUNTER — Emergency Department (HOSPITAL_BASED_OUTPATIENT_CLINIC_OR_DEPARTMENT_OTHER): Payer: Managed Care, Other (non HMO)

## 2012-07-01 DIAGNOSIS — M79661 Pain in right lower leg: Secondary | ICD-10-CM

## 2012-07-01 DIAGNOSIS — R0989 Other specified symptoms and signs involving the circulatory and respiratory systems: Secondary | ICD-10-CM | POA: Insufficient documentation

## 2012-07-01 DIAGNOSIS — Y838 Other surgical procedures as the cause of abnormal reaction of the patient, or of later complication, without mention of misadventure at the time of the procedure: Secondary | ICD-10-CM | POA: Insufficient documentation

## 2012-07-01 DIAGNOSIS — D649 Anemia, unspecified: Secondary | ICD-10-CM | POA: Insufficient documentation

## 2012-07-01 DIAGNOSIS — Z79899 Other long term (current) drug therapy: Secondary | ICD-10-CM | POA: Insufficient documentation

## 2012-07-01 DIAGNOSIS — S301XXA Contusion of abdominal wall, initial encounter: Secondary | ICD-10-CM

## 2012-07-01 DIAGNOSIS — R109 Unspecified abdominal pain: Secondary | ICD-10-CM | POA: Insufficient documentation

## 2012-07-01 DIAGNOSIS — Z3202 Encounter for pregnancy test, result negative: Secondary | ICD-10-CM | POA: Insufficient documentation

## 2012-07-01 DIAGNOSIS — R0600 Dyspnea, unspecified: Secondary | ICD-10-CM

## 2012-07-01 DIAGNOSIS — R000 Tachycardia, unspecified: Secondary | ICD-10-CM | POA: Insufficient documentation

## 2012-07-01 DIAGNOSIS — R0602 Shortness of breath: Secondary | ICD-10-CM | POA: Insufficient documentation

## 2012-07-01 DIAGNOSIS — M79609 Pain in unspecified limb: Secondary | ICD-10-CM | POA: Insufficient documentation

## 2012-07-01 DIAGNOSIS — R110 Nausea: Secondary | ICD-10-CM | POA: Insufficient documentation

## 2012-07-01 DIAGNOSIS — IMO0002 Reserved for concepts with insufficient information to code with codable children: Secondary | ICD-10-CM | POA: Insufficient documentation

## 2012-07-01 DIAGNOSIS — R6883 Chills (without fever): Secondary | ICD-10-CM | POA: Insufficient documentation

## 2012-07-01 DIAGNOSIS — R0609 Other forms of dyspnea: Secondary | ICD-10-CM | POA: Insufficient documentation

## 2012-07-01 LAB — URINALYSIS, ROUTINE W REFLEX MICROSCOPIC
Bilirubin Urine: NEGATIVE
Glucose, UA: NEGATIVE mg/dL
Ketones, ur: NEGATIVE mg/dL
Leukocytes, UA: NEGATIVE
Nitrite: NEGATIVE
Protein, ur: NEGATIVE mg/dL
Specific Gravity, Urine: 1.021 (ref 1.005–1.030)
Urobilinogen, UA: 0.2 mg/dL (ref 0.0–1.0)
pH: 6 (ref 5.0–8.0)

## 2012-07-01 LAB — CBC WITH DIFFERENTIAL/PLATELET
Basophils Absolute: 0.1 10*3/uL (ref 0.0–0.1)
Basophils Relative: 1 % (ref 0–1)
Eosinophils Absolute: 0.4 10*3/uL (ref 0.0–0.7)
Eosinophils Relative: 4 % (ref 0–5)
HCT: 32.1 % — ABNORMAL LOW (ref 36.0–46.0)
Hemoglobin: 10.4 g/dL — ABNORMAL LOW (ref 12.0–15.0)
Lymphocytes Relative: 29 % (ref 12–46)
Lymphs Abs: 2.5 10*3/uL (ref 0.7–4.0)
MCH: 26.4 pg (ref 26.0–34.0)
MCHC: 32.4 g/dL (ref 30.0–36.0)
MCV: 81.5 fL (ref 78.0–100.0)
Monocytes Absolute: 0.8 10*3/uL (ref 0.1–1.0)
Monocytes Relative: 9 % (ref 3–12)
Neutro Abs: 4.8 10*3/uL (ref 1.7–7.7)
Neutrophils Relative %: 57 % (ref 43–77)
Platelets: 345 10*3/uL (ref 150–400)
RBC: 3.94 MIL/uL (ref 3.87–5.11)
RDW: 14.1 % (ref 11.5–15.5)
WBC: 8.5 10*3/uL (ref 4.0–10.5)

## 2012-07-01 LAB — COMPREHENSIVE METABOLIC PANEL
ALT: 15 U/L (ref 0–35)
AST: 19 U/L (ref 0–37)
Albumin: 3.5 g/dL (ref 3.5–5.2)
Alkaline Phosphatase: 71 U/L (ref 39–117)
BUN: 19 mg/dL (ref 6–23)
CO2: 25 mEq/L (ref 19–32)
Calcium: 9.5 mg/dL (ref 8.4–10.5)
Chloride: 103 mEq/L (ref 96–112)
Creatinine, Ser: 1.1 mg/dL (ref 0.50–1.10)
GFR calc Af Amer: 69 mL/min — ABNORMAL LOW (ref >90)
GFR calc non Af Amer: 59 mL/min — ABNORMAL LOW (ref >90)
Glucose, Bld: 92 mg/dL (ref 70–99)
Potassium: 3.8 mEq/L (ref 3.5–5.1)
Sodium: 139 mEq/L (ref 135–145)
Total Bilirubin: 0.3 mg/dL (ref 0.3–1.2)
Total Protein: 7.6 g/dL (ref 6.0–8.3)

## 2012-07-01 LAB — URINE MICROSCOPIC-ADD ON

## 2012-07-01 LAB — LIPASE, BLOOD: Lipase: 21 U/L (ref 11–59)

## 2012-07-01 LAB — PREGNANCY, URINE: Preg Test, Ur: NEGATIVE

## 2012-07-01 MED ORDER — IOHEXOL 350 MG/ML SOLN
100.0000 mL | Freq: Once | INTRAVENOUS | Status: AC | PRN
  Administered 2012-07-01: 100 mL via INTRAVENOUS

## 2012-07-01 NOTE — ED Notes (Signed)
Pt in with c/o abdominal pain, right calf pain, SOB N s/p removal of ports from lipo surgery.  

## 2012-07-01 NOTE — ED Notes (Signed)
Pt refuses w/c, amb to room 5 with slow, steady gait, s/b @ provided. Pt reports having liposuction on 5/12, had 2 jp drains until last Thursday. After ports removed pt states she developed sob and right calf pains.

## 2012-07-01 NOTE — ED Provider Notes (Signed)
History    This chart was scribed for Dione Booze, MD by Donne Anon, ED Scribe. This patient was seen in room MH05/MH05 and the patient's care was started at 1740.   CSN: [id number]  Arrival date & time 07/01/12  1715   First MD Initiated Contact with Patient 07/01/12 1740      No chief complaint on file.    The history is provided by the patient. No language interpreter was used.   HPI Comments: Laurie [name] is a 47 y.o. female who presents to the Emergency Department complaining of 5 days of gradual onset, gradually worsening, intermittent, severe abdominal pain. She reports associated right lower calf pain described as throbbing, SOB, nausea, and chills. She denies CP, vomiting or any other pain. She reports she had liposuction on 06/10/12 and had her ports removed 4 days ago because she developed SOB and right calf pain. Her LMP was 05/14/12 and she does not currently use birth control.  No past medical history on file.  Past Surgical History  Procedure Laterality Date  . Total hip arthroplasty      No family history on file.  History  Substance Use Topics  . Smoking status: Never Smoker   . Smokeless tobacco: Never Used  . Alcohol Use: No    OB History   Grav Para Term Preterm Abortions TAB SAB Ect Mult Living                  Review of Systems  Constitutional: Positive for chills.  Respiratory: Positive for shortness of breath.   Cardiovascular: Negative for chest pain.  Gastrointestinal: Positive for nausea and abdominal pain. Negative for vomiting.  All other systems reviewed and are negative.    Allergies  Sulfa antibiotics  Home Medications   Current Outpatient Rx  Name  Route  Sig  Dispense  Refill  . calcium carbonate (OS-CAL) 600 MG TABS   Oral   Take 600 mg by mouth daily.           . ferrous fumarate-iron polysaccharide complex (TANDEM) 162-115.2 MG CAPS   Oral   Take 1 capsule by mouth daily with breakfast.           .  hydrocortisone cream 1 %   Topical   Apply 1 application topically daily as needed. For skin irritation/rash          . ibuprofen (ADVIL,MOTRIN) 200 MG tablet   Oral   Take 800 mg by mouth every 6 (six) hours as needed. For pain          . Multiple Vitamins-Minerals (MULTIVITAMINS THER. W/MINERALS) TABS   Oral   Take 1 tablet by mouth daily.           . Vitamin D, Ergocalciferol, (DRISDOL) 50000 UNITS CAPS   Oral   Take 50,000 Units by mouth every 7 (seven) days. Takes on sundays            Triage Vitals; BP 138/79  Pulse 94  Temp(Src) 98.7 F (37.1 C) (Oral)  Resp 16  Ht 5\' 7"  (1.702 m)  Wt 200 lb (90.719 kg)  BMI 31.32 kg/m2  SpO2 100%  Physical Exam  Nursing note and vitals reviewed. Constitutional: She is oriented to person, place, and time. She appears well-developed and well-nourished. No distress.  HENT:  Head: Normocephalic and atraumatic.  Eyes: EOM are normal.  Neck: Neck supple. No tracheal deviation present.  Cardiovascular: Normal heart sounds.  Tachycardia present.  Pulmonary/Chest: Effort normal and breath sounds normal. No respiratory distress.  Abdominal: Soft. There is tenderness. There is rebound.  Diffusely tender with voluntary guarding. Surgical scars in inguinal area are healing well without sign of infection.   Musculoskeletal: Normal range of motion.  Mild tenderness right popliteal area and right calf without any swelling, erythema, warmth or cords. Calf circumference is equal.  Neurological: She is alert and oriented to person, place, and time.  Skin: Skin is warm and dry.  Psychiatric: She has a normal mood and affect. Her behavior is normal.    ED Course  Procedures (including critical care time) DIAGNOSTIC STUDIES: Oxygen Saturation is 100% on RA, normal by my interpretation.    COORDINATION OF CARE: 6:04 PM Discussed treatment plan which includes IV fluids, CT scan and urinalysis with pt at bedside and pt agreed to plan.      Results for orders placed during the hospital encounter of 07/01/12  CBC WITH DIFFERENTIAL      Result Value Range   WBC 8.5  4.0 - 10.5 K/uL   RBC 3.94  3.87 - 5.11 MIL/uL   Hemoglobin 10.4 (*) 12.0 - 15.0 g/dL   HCT 62.1 (*) 30.8 - 65.7 %   MCV 81.5  78.0 - 100.0 fL   MCH 26.4  26.0 - 34.0 pg   MCHC 32.4  30.0 - 36.0 g/dL   RDW 84.6  96.2 - 95.2 %   Platelets 345  150 - 400 K/uL   Neutrophils Relative % 57  43 - 77 %   Neutro Abs 4.8  1.7 - 7.7 K/uL   Lymphocytes Relative 29  12 - 46 %   Lymphs Abs 2.5  0.7 - 4.0 K/uL   Monocytes Relative 9  3 - 12 %   Monocytes Absolute 0.8  0.1 - 1.0 K/uL   Eosinophils Relative 4  0 - 5 %   Eosinophils Absolute 0.4  0.0 - 0.7 K/uL   Basophils Relative 1  0 - 1 %   Basophils Absolute 0.1  0.0 - 0.1 K/uL  COMPREHENSIVE METABOLIC PANEL      Result Value Range   Sodium 139  135 - 145 mEq/L   Potassium 3.8  3.5 - 5.1 mEq/L   Chloride 103  96 - 112 mEq/L   CO2 25  19 - 32 mEq/L   Glucose, Bld 92  70 - 99 mg/dL   BUN 19  6 - 23 mg/dL   Creatinine, Ser 8.41  0.50 - 1.10 mg/dL   Calcium 9.5  8.4 - 32.4 mg/dL   Total Protein 7.6  6.0 - 8.3 g/dL   Albumin 3.5  3.5 - 5.2 g/dL   AST 19  0 - 37 U/L   ALT 15  0 - 35 U/L   Alkaline Phosphatase 71  39 - 117 U/L   Total Bilirubin 0.3  0.3 - 1.2 mg/dL   GFR calc non Af Amer 59 (*) >90 mL/min   GFR calc Af Amer 69 (*) >90 mL/min  LIPASE, BLOOD      Result Value Range   Lipase 21  11 - 59 U/L  URINALYSIS, ROUTINE W REFLEX MICROSCOPIC      Result Value Range   Color, Urine YELLOW  YELLOW   APPearance CLEAR  CLEAR   Specific Gravity, Urine 1.021  1.005 - 1.030   pH 6.0  5.0 - 8.0   Glucose, UA NEGATIVE  NEGATIVE mg/dL   Hgb urine dipstick SMALL (*)  NEGATIVE   Bilirubin Urine NEGATIVE  NEGATIVE   Ketones, ur NEGATIVE  NEGATIVE mg/dL   Protein, ur NEGATIVE  NEGATIVE mg/dL   Urobilinogen, UA 0.2  0.0 - 1.0 mg/dL   Nitrite NEGATIVE  NEGATIVE   Leukocytes, UA NEGATIVE  NEGATIVE  PREGNANCY,  URINE      Result Value Range   Preg Test, Ur NEGATIVE  NEGATIVE  URINE MICROSCOPIC-ADD ON      Result Value Range   Squamous Epithelial / LPF RARE  RARE   WBC, UA 0-2  <3 WBC/hpf   RBC / HPF 3-6  <3 RBC/hpf   Bacteria, UA RARE  RARE   Ct Angio Chest W/cm &/or Wo Cm  07/01/2012   *RADIOLOGY REPORT*  Clinical Data: Anterior abdominal pain.  Short of breath.  Right calf pain.  Pulmonary embolus.  Recent surgery.  CT ANGIOGRAPHY CHEST  Technique:  Multidetector CT imaging of the chest using the standard protocol during bolus administration of intravenous contrast. Multiplanar reconstructed images including MIPs were obtained and reviewed to evaluate the vascular anatomy.  Contrast: OMNIPAQUE IOHEXOL 350 MG/ML SOLN  Comparison: None.  Findings: Technically adequate study.  No pulmonary embolism.  The lungs are clear aside from scattered areas of dependent atelectasis.  No acute aortic abnormality.  No mediastinal, hilar, or axillary lymphadenopathy.  No effusion.  Bones appear within normal limits.  Mild thoracic spondylosis.  IMPRESSION: No acute abnormality.  Negative CTA of the chest.   Original Report Authenticated By: Andreas Newport, M.D.   Ct Abdomen Pelvis W Contrast  07/01/2012   *RADIOLOGY REPORT*  Clinical Data: Short of breath.  Abdominal pain.  Recent liposuction.  Short of breath.  Pulmonary embolism.  Abscess.  CT ABDOMEN AND PELVIS WITH CONTRAST  Technique:  Multidetector CT imaging of the abdomen and pelvis was performed following the standard protocol during bolus administration of intravenous contrast.  Contrast: OMNIPAQUE IOHEXOL 350 MG/ML SOLN  Comparison: None.  Findings:  Liver:  Normal.  Spleen:  Normal.  Gallbladder:  Normal.  Common bile duct:  Normal.  Pancreas:  Normal.  Adrenal glands:  Normal bilaterally.  Kidneys:  Normal enhancement.  Normal delayed excretion of contrast.  Both ureters appear within normal limits.  Stomach:  Normal.  Small bowel:  Negative for  obstruction or adenopathy.  Grossly normal.  Colon:   Normal.  Pelvic Genitourinary:  Artifact from right total hip arthroplasty partially obscures the pelvis.  Mild enlargement of the right ovary, probably related to ovarian cysts although this is incompletely visualized.  An exophytic fibroid could have a similar appearance. Urinary bladder grossly appears decompressed.  No free fluid.  Bones:  No aggressive osseous lesions.  Severe left hip osteoarthritis.  Right total hip arthroplasty with associated artifact.  Vasculature: The visualized pelvic veins appear within normal limits without inflammatory changes or discrete filling defects.  Body Wall: Stranding is present diffusely in the anterior abdominal wall and flank regions.  There are thin crescentic fluid collections along both flanks, measuring 25 mm in thickness on the right and 15 mm in thickness on the left.  The largest is anteriorly in the midline in the infraumbilical region.  This measures 77 mm transverse by 23 mm AP.  Craniocaudal extent is 38 mm.  The appearance is nonspecific and may represent postoperative seroma.  Abscess at any of the sites cannot be excluded.  The smaller collections are present in the supraumbilical abdominal wall on the right.  Gas is present in  the left abdominal wall.  IMPRESSION: 1.  No acute intra-abdominal abnormality. 2.  Postoperative changes of the abdominal wall and both flanks compatible with recent liposuction.  The thin crescentic fluid collections may represent seroma, postoperative hematoma although abscess or infected fluid collection cannot be excluded.  The largest of these is in the infraumbilical anterior abdominal wall in the midline.   Original Report Authenticated By: Andreas Newport, M.D.   US Venous Img Lower Unilateral Right  07/01/2012   *RADIOLOGY REPORT*  Clinical Data: Recent abdominal surgery.  Leg pain.  Short of breath.  Swelling.  RIGHT LOWER EXTREMITY VENOUS DUPLEX ULTRASOUND  Technique:   Gray-scale sonography with graded compression, as well as color Doppler and duplex ultrasound, were performed to evaluate the deep venous system of the lower extremity from the level of the common femoral vein through the popliteal and proximal calf veins. Spectral Doppler was utilized to evaluate flow at rest and with distal augmentation maneuvers.  Comparison:  None.  Findings: There is no evidence of DVT in the lower extremity. Normal phasicity, augmentation, and compressibility in the saphenofemoral junction, common femoral vein, femoral vein, deep femoral vein, and popliteal vein.  The patient was unable to the Valsalva of because of recent operation.  IMPRESSION: Negative right lower extremity DVT study.   Original Report Authenticated By: Andreas Newport, M.D.      Date: 07/01/2012  Rate: 107  Rhythm: sinus tachycardia  QRS Axis: normal  Intervals: normal  ST/T Wave abnormalities: normal  Conduction Disutrbances:none  Narrative Interpretation: Sinus tachycardia, otherwise normal ECG. When compared with ECG of 12/31/2010, no significant changes are seen..  Old EKG Reviewed: unchanged    1. Dyspnea   2. Pain of right lower leg   3. Abdominal wall seroma, initial encounter   4. Anemia       MDM  Dyspnea and patient had recent liposuction surgery with decreased mobility worrisome for possible pulmonary embolism. The pain is of uncertain cause. I do not see clinical evidence of DVT. There is no swelling, or palpable cord. She will be sent for CT of her chest. Abdominal pain is worrisome for possible bleeding into the abdominal wall so CT will be obtained of abdomen as well.  CT angiogram shows no evidence of pulmonary embolism, CT and abdomen does show evidence of seroma of the abdominal wall with inflammatory changes which would account for her pain. Hemoglobin has dropped about 3 g from her last hemoglobin indicating that there was a significant amount of postoperative bleeding. There  is no evidence of ongoing bleeding. Venous ultrasound was negative for DVT. She has a followup appointment with her surgeon in 3 days and she is to keep that appointment. There is no clear reason for her dyspnea other than I suspect that she may be splinting because of her abdominal pain. The patient was informed of all of her findings.   I personally performed the services described in this documentation, which was scribed in my presence. The recorded information has been reviewed and is accurate.        Dione Booze, MD 07/01/12 2135

## 2012-07-11 ENCOUNTER — Other Ambulatory Visit: Payer: Self-pay

## 2012-07-11 DIAGNOSIS — Z1231 Encounter for screening mammogram for malignant neoplasm of breast: Secondary | ICD-10-CM

## 2012-08-16 ENCOUNTER — Ambulatory Visit
Admission: RE | Admit: 2012-08-16 | Discharge: 2012-08-16 | Disposition: A | Discharge status: Home / Self Care | Payer: Managed Care, Other (non HMO) | Source: Ambulatory Visit

## 2012-08-16 DIAGNOSIS — Z1231 Encounter for screening mammogram for malignant neoplasm of breast: Secondary | ICD-10-CM

## 2013-07-09 ENCOUNTER — Other Ambulatory Visit: Payer: Self-pay

## 2013-07-09 DIAGNOSIS — Z1231 Encounter for screening mammogram for malignant neoplasm of breast: Secondary | ICD-10-CM

## 2013-07-09 DIAGNOSIS — E28319 Asymptomatic premature menopause: Secondary | ICD-10-CM

## 2013-07-09 HISTORY — DX: Asymptomatic premature menopause: E28.319

## 2013-08-19 ENCOUNTER — Ambulatory Visit: Payer: Managed Care, Other (non HMO)

## 2013-08-26 ENCOUNTER — Ambulatory Visit
Admission: RE | Admit: 2013-08-26 | Discharge: 2013-08-26 | Disposition: A | Discharge status: Home / Self Care | Payer: Managed Care, Other (non HMO) | Source: Ambulatory Visit

## 2013-08-26 DIAGNOSIS — Z1231 Encounter for screening mammogram for malignant neoplasm of breast: Secondary | ICD-10-CM

## 2013-08-28 ENCOUNTER — Ambulatory Visit: Payer: Managed Care, Other (non HMO)

## 2013-11-18 DIAGNOSIS — N62 Hypertrophy of breast: Secondary | ICD-10-CM

## 2013-11-18 HISTORY — DX: Hypertrophy of breast: N62

## 2014-07-17 ENCOUNTER — Other Ambulatory Visit: Payer: Self-pay

## 2014-07-17 DIAGNOSIS — N644 Mastodynia: Secondary | ICD-10-CM

## 2014-07-27 ENCOUNTER — Other Ambulatory Visit: Payer: Self-pay | Admitting: Obstetrics & Gynecology

## 2014-07-27 ENCOUNTER — Other Ambulatory Visit: Payer: Self-pay | Admitting: Pediatrics

## 2014-07-27 ENCOUNTER — Other Ambulatory Visit: Payer: Self-pay

## 2014-07-27 DIAGNOSIS — N644 Mastodynia: Secondary | ICD-10-CM

## 2014-07-29 ENCOUNTER — Other Ambulatory Visit: Payer: Self-pay | Admitting: Internal Medicine

## 2014-07-29 DIAGNOSIS — N644 Mastodynia: Secondary | ICD-10-CM

## 2014-08-26 ENCOUNTER — Other Ambulatory Visit: Payer: Self-pay

## 2014-08-26 ENCOUNTER — Other Ambulatory Visit: Payer: Self-pay | Admitting: Internal Medicine

## 2014-08-26 DIAGNOSIS — N644 Mastodynia: Secondary | ICD-10-CM

## 2014-08-28 ENCOUNTER — Ambulatory Visit
Admission: RE | Admit: 2014-08-28 | Discharge: 2014-08-28 | Disposition: A | Discharge status: Home / Self Care | Payer: BLUE CROSS/BLUE SHIELD | Source: Ambulatory Visit | Attending: Internal Medicine | Admitting: Internal Medicine

## 2014-08-28 DIAGNOSIS — N644 Mastodynia: Secondary | ICD-10-CM

## 2015-01-19 DIAGNOSIS — L700 Acne vulgaris: Secondary | ICD-10-CM

## 2015-01-19 HISTORY — DX: Acne vulgaris: L70.0

## 2015-02-15 ENCOUNTER — Other Ambulatory Visit: Payer: Self-pay

## 2015-02-18 DIAGNOSIS — F419 Anxiety disorder, unspecified: Secondary | ICD-10-CM

## 2015-02-18 HISTORY — DX: Anxiety disorder, unspecified: F41.9

## 2015-02-26 ENCOUNTER — Emergency Department (HOSPITAL_COMMUNITY)
Admission: EM | Admit: 2015-02-26 | Discharge: 2015-02-26 | Disposition: A | Discharge status: Home / Self Care | Payer: BLUE CROSS/BLUE SHIELD | Attending: Emergency Medicine | Admitting: Emergency Medicine

## 2015-02-26 ENCOUNTER — Emergency Department (HOSPITAL_COMMUNITY): Payer: BLUE CROSS/BLUE SHIELD

## 2015-02-26 ENCOUNTER — Encounter (HOSPITAL_COMMUNITY): Payer: Self-pay | Admitting: Cardiology

## 2015-02-26 DIAGNOSIS — R000 Tachycardia, unspecified: Secondary | ICD-10-CM | POA: Diagnosis not present

## 2015-02-26 DIAGNOSIS — Z79899 Other long term (current) drug therapy: Secondary | ICD-10-CM | POA: Diagnosis not present

## 2015-02-26 DIAGNOSIS — R0600 Dyspnea, unspecified: Secondary | ICD-10-CM | POA: Diagnosis not present

## 2015-02-26 DIAGNOSIS — F419 Anxiety disorder, unspecified: Secondary | ICD-10-CM | POA: Insufficient documentation

## 2015-02-26 DIAGNOSIS — R0602 Shortness of breath: Secondary | ICD-10-CM | POA: Diagnosis not present

## 2015-02-26 DIAGNOSIS — R079 Chest pain, unspecified: Secondary | ICD-10-CM | POA: Diagnosis present

## 2015-02-26 LAB — CBC
HCT: 39.4 % (ref 36.0–46.0)
Hemoglobin: 12.4 g/dL (ref 12.0–15.0)
MCH: 25.6 pg — ABNORMAL LOW (ref 26.0–34.0)
MCHC: 31.5 g/dL (ref 30.0–36.0)
MCV: 81.4 fL (ref 78.0–100.0)
Platelets: 289 10*3/uL (ref 150–400)
RBC: 4.84 MIL/uL (ref 3.87–5.11)
RDW: 13.9 % (ref 11.5–15.5)
WBC: 11.2 10*3/uL — ABNORMAL HIGH (ref 4.0–10.5)

## 2015-02-26 LAB — BASIC METABOLIC PANEL
Anion gap: 12 (ref 5–15)
BUN: 14 mg/dL (ref 6–20)
CO2: 25 mmol/L (ref 22–32)
Calcium: 9.2 mg/dL (ref 8.9–10.3)
Chloride: 101 mmol/L (ref 101–111)
Creatinine, Ser: 0.91 mg/dL (ref 0.44–1.00)
GFR calc Af Amer: >60 mL/min (ref >60)
GFR calc non Af Amer: >60 mL/min (ref >60)
Glucose, Bld: 90 mg/dL (ref 65–99)
Potassium: 4 mmol/L (ref 3.5–5.1)
Sodium: 138 mmol/L (ref 135–145)

## 2015-02-26 LAB — TSH: TSH: 1.098 u[IU]/mL (ref 0.350–4.500)

## 2015-02-26 LAB — TROPONIN I: Troponin I: <0.03 ng/mL (ref <0.031)

## 2015-02-26 MED ORDER — LORAZEPAM 1 MG PO TABS: 1.0000 mg | ORAL_TABLET | Freq: Two times a day (BID) | ORAL | Status: DC

## 2015-02-26 MED ORDER — METOPROLOL TARTRATE 50 MG PO TABS: 50.0000 mg | ORAL_TABLET | Freq: Two times a day (BID) | ORAL | Status: DC

## 2015-02-26 MED ORDER — LORAZEPAM 2 MG/ML IJ SOLN
1.0000 mg | Freq: Once | INTRAMUSCULAR | Status: AC
  Administered 2015-02-26: 1 mg via INTRAVENOUS
  Filled 2015-02-26: qty 1

## 2015-02-26 MED ORDER — IOHEXOL 350 MG/ML SOLN
80.0000 mL | Freq: Once | INTRAVENOUS | Status: AC | PRN
  Administered 2015-02-26: 80 mL via INTRAVENOUS

## 2015-02-26 NOTE — ED Notes (Signed)
Pt reports she started feeling SOB on Friday and gotten worse into today. Was at her PCP and told to come here for PE rule out.

## 2015-02-26 NOTE — Discharge Instructions (Signed)
Follow up with yourcardiologist, and primary care physician. Return to the ER with any new, worsening, or changing symptoms.    Generalized Anxiety Disorder Generalized anxiety disorder (GAD) is a mental disorder. It interferes with life functions, including relationships, work, and school. GAD is different from normal anxiety, which everyone experiences at some point in their lives in response to specific life events and activities. Normal anxiety actually helps [place] prepare for and get through these life events and activities. Normal anxiety goes away after the event or activity is over.  GAD causes anxiety that is not necessarily related to specific events or activities. It also causes excess anxiety in proportion to specific events or activities. The anxiety associated with GAD is also difficult to control. GAD can vary from mild to severe. People with severe GAD can have intense waves of anxiety with physical symptoms (panic attacks).  SYMPTOMS The anxiety and worry associated with GAD are difficult to control. This anxiety and worry are related to many life events and activities and also occur more days than not for 6 months or longer. People with GAD also have three or more of the following symptoms (one or more in children):  Restlessness.   Fatigue.  Difficulty concentrating.   Irritability.  Muscle tension.  Difficulty sleeping or unsatisfying sleep. DIAGNOSIS GAD is diagnosed through an assessment by your health care provider. Your health care provider will ask you questions aboutyour mood,physical symptoms, and events in your life. Your health care provider may ask you about your medical history and use of alcohol or drugs, including prescription medicines. Your health care provider may also do a physical exam and blood tests. Certain medical conditions and the use of certain substances can cause symptoms similar to those associated with GAD. Your health care provider may refer  you to a mental health specialist for further evaluation. TREATMENT The following therapies are usually used to treat GAD:   Medication. Antidepressant medication usually is prescribed for long-term daily control. Antianxiety medicines may be added in severe cases, especially when panic attacks occur.   Talk therapy (psychotherapy). Certain types of talk therapy can be helpful in treating GAD by providing support, education, and guidance. A form of talk therapy called cognitive behavioral therapy can teach you healthy ways to think about and react to daily life events and activities.  Stress managementtechniques. These include yoga, meditation, and exercise and can be very helpful when they are practiced regularly. A mental health specialist can help determine which treatment is best for you. Some people see improvement with one therapy. However, other people require a combination of therapies.   This information is not intended to replace advice given to you by your health care provider. Make sure you discuss any questions you have with your health care provider.   Document Released: 05/13/2012 Document Revised: 02/06/2014 Document Reviewed: 05/13/2012 Elsevier Interactive Patient Education 2016 Elsevier Inc.  Nonspecific Tachycardia Tachycardia is a faster than normal heartbeat (more than 100 beats per minute). In adults, the heart normally beats between 60 and 100 times a minute. A fast heartbeat may be a normal response to exercise or stress. It does not necessarily mean that something is wrong. However, sometimes when your heart beats too fast it may not be able to pump enough blood to the rest of your body. This can result in chest pain, shortness of breath, dizziness, and even fainting. Nonspecific tachycardia means that the specific cause or pattern of your tachycardia is unknown. CAUSES  Tachycardia  may be harmless or it may be due to a more serious underlying cause. Possible causes of  tachycardia include:  Exercise or exertion.  Fever.  Pain or injury.  Infection.  Loss of body fluids (dehydration).  Overactive thyroid.  Lack of red blood cells (anemia).  Anxiety and stress.  Alcohol.  Caffeine.  Tobacco products.  Diet pills.  Illegal drugs.  Heart disease. SYMPTOMS  Rapid or irregular heartbeat (palpitations).  Suddenly feeling your heart beating (cardiac awareness).  Dizziness.  Tiredness (fatigue).  Shortness of breath.  Chest pain.  Nausea.  Fainting. DIAGNOSIS  Your caregiver will perform a physical exam and take your medical history. In some cases, a heart specialist (cardiologist) may be consulted. Your caregiver may also order:  Blood tests.  Electrocardiography. This test records the electrical activity of your heart.  A heart monitoring test. TREATMENT  Treatment will depend on the likely cause of your tachycardia. The goal is to treat the underlying cause of your tachycardia. Treatment methods may include:  Replacement of fluids or blood through an intravenous (IV) tube for moderate to severe dehydration or anemia.  New medicines or changes in your current medicines.  Diet and lifestyle changes.  Treatment for certain infections.  Stress relief or relaxation methods. HOME CARE INSTRUCTIONS   Rest.  Drink enough fluids to keep your urine clear or pale yellow.  Do not smoke.  Avoid:  Caffeine.  Tobacco.  Alcohol.  Chocolate.  Stimulants such as over-the-counter diet pills or pills that help you stay awake.  Situations that cause anxiety or stress.  Illegal drugs such as marijuana, phencyclidine (PCP), and cocaine.  Only take medicine as directed by your caregiver.  Keep all follow-up appointments as directed by your caregiver. SEEK IMMEDIATE MEDICAL CARE IF:   You have pain in your chest, upper arms, jaw, or neck.  You become weak, dizzy, or feel faint.  You have palpitations that will not  go away.  You vomit, have diarrhea, or pass blood in your stool.  Your skin is cool, pale, and wet.  You have a fever that will not go away with rest, fluids, and medicine. MAKE SURE YOU:   Understand these instructions.  Will watch your condition.  Will get help right away if you are not doing well or get worse.   This information is not intended to replace advice given to you by your health care provider. Make sure you discuss any questions you have with your health care provider.   Document Released: 02/24/2004 Document Revised: 04/10/2011 Document Reviewed: 07/31/2014 Elsevier Interactive Patient Education [employer]

## 2015-03-02 HISTORY — PX: TRANSTHORACIC ECHOCARDIOGRAM: SHX275

## 2015-03-03 HISTORY — PX: OTHER SURGICAL HISTORY: SHX169

## 2015-03-03 NOTE — ED Provider Notes (Signed)
CSN: [id number]     Arrival date & time 02/26/15  1127 History   First MD Initiated Contact with Patient 02/26/15 1147     Chief Complaint  Patient presents with  . Shortness of Breath  . Chest Pain      HPI  Presents evaluation of shortness of breath and tachycardia. Seen her primary care physician referred here. No history of PE.  She reports several months of episodes of dyspnea that are intermittent. does admit to some episodes of anxiety but not universally. No pleuritic discomfort. No fever. No prolonged immobilization cast splints surgeries malignancies or other DVT or PE risk. No history of personal or family history of PE or DVT.  History reviewed. No pertinent past medical history. Past Surgical History  Procedure Laterality Date  . Total hip arthroplasty     History reviewed. No pertinent family history. Social History  Substance Use Topics  . Smoking status: Never Smoker   . Smokeless tobacco: Never Used  . Alcohol Use: No   OB History    No data available     Review of Systems  Constitutional: Negative for fever, chills, diaphoresis, appetite change and fatigue.  HENT: Negative for mouth sores, sore throat and trouble swallowing.   Eyes: Negative for visual disturbance.  Respiratory: Positive for shortness of breath. Negative for cough, chest tightness and wheezing.   Cardiovascular: Negative for chest pain.  Gastrointestinal: Negative for nausea, vomiting, abdominal pain, diarrhea and abdominal distention.  Endocrine: Negative for polydipsia, polyphagia and polyuria.  Genitourinary: Negative for dysuria, frequency and hematuria.  Musculoskeletal: Negative for gait problem.  Skin: Negative for color change, pallor and rash.  Neurological: Negative for dizziness, syncope, light-headedness and headaches.  Hematological: Does not bruise/bleed easily.  Psychiatric/Behavioral: Negative for behavioral problems and confusion. The patient is nervous/anxious.        Allergies  Sulfa antibiotics  Home Medications   Prior to Admission medications   Medication Sig Start Date End Date Taking? Authorizing Provider  allopurinol (ZYLOPRIM) 100 MG tablet Take 100 mg by mouth daily. 07/28/14  Yes Historical Provider, MD  calcium carbonate (OS-CAL) 600 MG TABS Take 600 mg by mouth daily.     Yes Historical Provider, MD  Multiple Vitamins-Minerals (MULTIVITAMINS THER. W/MINERALS) TABS Take 1 tablet by mouth daily.     Yes Historical Provider, MD  LORazepam (ATIVAN) 1 MG tablet Take 1 tablet (1 mg total) by mouth 2 (two) times daily. 02/26/15   Rolland Porter, MD  metoprolol (LOPRESSOR) 50 MG tablet Take 1 tablet (50 mg total) by mouth 2 (two) times daily. 02/26/15   Rolland Porter, MD   BP 125/92 mmHg  Pulse 109  Temp(Src) 97.8 F (36.6 C) (Oral)  Resp 14  Wt 200 lb (90.719 kg)  SpO2 100% Physical Exam  Constitutional: She is oriented to person, place, and time. She appears well-developed and well-nourished. No distress.  HENT:  Head: Normocephalic.  Eyes: Conjunctivae are normal. Pupils are equal, round, and reactive to light. No scleral icterus.  Neck: Normal range of motion. Neck supple. No thyromegaly present.  Cardiovascular: Normal rate and regular rhythm.  Exam reveals no gallop and no friction rub.   No murmur heard. Pulmonary/Chest: Effort normal and breath sounds normal. No respiratory distress. She has no wheezes. She has no rales.  Cardia and tachypnea are intermittent and do seem to be associated somewhat with anxiety.  Abdominal: Soft. Bowel sounds are normal. She exhibits no distension. There is no tenderness. There is no  rebound.  Musculoskeletal: Normal range of motion.  Neurological: She is alert and oriented to person, place, and time.  Skin: Skin is warm and dry. No rash noted.  Psychiatric: She has a normal mood and affect. Her behavior is normal.    ED Course  Procedures (including critical care time) Labs Review Labs Reviewed   CBC - Abnormal; Notable for the following:    WBC 11.2 (*)    MCH 25.6 (*)    All other components within normal limits  BASIC METABOLIC PANEL  TROPONIN I  TSH  I-STAT TROPOININ, ED    Imaging Review No results found. I have personally reviewed and evaluated these images and lab results as part of my medical decision-making.   EKG Interpretation   Date/Time:    Ventricular Rate:    PR Interval:    QRS Duration:   QT Interval:    QTC Calculation:   R Axis:     Text Interpretation:        MDM   Final diagnoses:  Tachycardia  Anxiety    TSH not elevated. Normal CT angiogram. Not hypoxemic or febrile. Does have a resting tachycardia. Some component of anxiety as well. Plan his cartilage he follow-up. Short dose beta blocker. When necessary Ativan.    Rolland Porter, MD 03/03/15 [phone]

## 2015-04-12 DIAGNOSIS — I8393 Asymptomatic varicose veins of bilateral lower extremities: Secondary | ICD-10-CM | POA: Insufficient documentation

## 2015-04-19 ENCOUNTER — Other Ambulatory Visit: Payer: Self-pay | Admitting: *Deleted

## 2015-04-19 DIAGNOSIS — I83892 Varicose veins of left lower extremities with other complications: Secondary | ICD-10-CM

## 2015-04-26 ENCOUNTER — Other Ambulatory Visit (HOSPITAL_COMMUNITY): Payer: Self-pay | Admitting: Internal Medicine

## 2015-04-26 DIAGNOSIS — I83893 Varicose veins of bilateral lower extremities with other complications: Secondary | ICD-10-CM

## 2015-04-26 DIAGNOSIS — R52 Pain, unspecified: Secondary | ICD-10-CM

## 2015-04-26 HISTORY — DX: Varicose veins of bilateral lower extremities with other complications: I83.893

## 2015-04-27 ENCOUNTER — Ambulatory Visit (HOSPITAL_COMMUNITY)
Admission: RE | Admit: 2015-04-27 | Discharge: 2015-04-27 | Disposition: A | Discharge status: Home / Self Care | Payer: BLUE CROSS/BLUE SHIELD | Source: Ambulatory Visit | Attending: Internal Medicine | Admitting: Internal Medicine

## 2015-04-27 ENCOUNTER — Ambulatory Visit (HOSPITAL_BASED_OUTPATIENT_CLINIC_OR_DEPARTMENT_OTHER)
Admission: RE | Admit: 2015-04-27 | Discharge: 2015-04-27 | Disposition: A | Discharge status: Home / Self Care | Payer: BLUE CROSS/BLUE SHIELD | Source: Ambulatory Visit | Attending: Internal Medicine | Admitting: Internal Medicine

## 2015-04-27 DIAGNOSIS — R52 Pain, unspecified: Secondary | ICD-10-CM

## 2015-04-27 DIAGNOSIS — M79605 Pain in left leg: Secondary | ICD-10-CM | POA: Insufficient documentation

## 2015-04-27 NOTE — Progress Notes (Signed)
VASCULAR LAB PRELIMINARY  PRELIMINARY  PRELIMINARY  PRELIMINARY  Left lower extremity venous duplex completed.    Preliminary report:  Left :  No evidence of DVT, superficial thrombosis, or Baker's cyst.  SLAUGHTER, [place], RVS 04/27/2015, 3:56 PM

## 2015-06-21 DIAGNOSIS — R0602 Shortness of breath: Secondary | ICD-10-CM | POA: Insufficient documentation

## 2015-07-05 DIAGNOSIS — R600 Localized edema: Secondary | ICD-10-CM | POA: Insufficient documentation

## 2015-07-05 DIAGNOSIS — R000 Tachycardia, unspecified: Secondary | ICD-10-CM | POA: Insufficient documentation

## 2015-07-05 DIAGNOSIS — L309 Dermatitis, unspecified: Secondary | ICD-10-CM | POA: Insufficient documentation

## 2015-07-05 DIAGNOSIS — M797 Fibromyalgia: Secondary | ICD-10-CM | POA: Insufficient documentation

## 2015-07-05 DIAGNOSIS — N809 Endometriosis, unspecified: Secondary | ICD-10-CM | POA: Insufficient documentation

## 2015-07-05 DIAGNOSIS — E8770 Fluid overload, unspecified: Secondary | ICD-10-CM | POA: Insufficient documentation

## 2015-07-05 DIAGNOSIS — J309 Allergic rhinitis, unspecified: Secondary | ICD-10-CM | POA: Insufficient documentation

## 2015-07-05 DIAGNOSIS — K219 Gastro-esophageal reflux disease without esophagitis: Secondary | ICD-10-CM | POA: Insufficient documentation

## 2015-07-05 DIAGNOSIS — M1611 Unilateral primary osteoarthritis, right hip: Secondary | ICD-10-CM | POA: Insufficient documentation

## 2015-07-05 DIAGNOSIS — R209 Unspecified disturbances of skin sensation: Secondary | ICD-10-CM

## 2015-07-05 DIAGNOSIS — IMO0001 Reserved for inherently not codable concepts without codable children: Secondary | ICD-10-CM | POA: Insufficient documentation

## 2015-07-05 DIAGNOSIS — E669 Obesity, unspecified: Secondary | ICD-10-CM | POA: Insufficient documentation

## 2015-07-15 ENCOUNTER — Encounter: Payer: Self-pay | Admitting: Vascular Surgery

## 2015-07-19 ENCOUNTER — Telehealth: Payer: Self-pay | Admitting: Cardiology

## 2015-07-19 NOTE — Telephone Encounter (Signed)
Received records from [facility] for appointment on 07/23/15 with Dr Herbie BaltimoreHarding.  [facility] for Dr Elissa HeftyHarding's schedule on 07/23/15. lp

## 2015-07-22 ENCOUNTER — Ambulatory Visit (HOSPITAL_COMMUNITY): Payer: BLUE CROSS/BLUE SHIELD

## 2015-07-22 ENCOUNTER — Encounter: Payer: BLUE CROSS/BLUE SHIELD | Admitting: Vascular Surgery

## 2015-07-22 NOTE — Progress Notes (Signed)
PCP: Dorrene German, MD  - Rheumatologist: [facility] rheumatology  Clinic Note: Chief Complaint  Patient presents with  . New Evaluation    Cardiology consultation: pt c/o fast heart rate and SOB    HPI: Laurie [name] is a 50 y.o. female with a PMH below who presents today for Second opinion cardiology evaluation.  Laurie [name] was last seen on May 22 Dr. Concepcion Elk to follow-up from her long-standing history of bilateral lower extremity edema. She noted feeling swollen all over in her feet. She also noted pains in her neck back shoulder etc. She said that her symptoms of palpitations and shortness of breath have improved but not resolved. She has a few episodes a few times a week. - She was referred to Dr. Sherril Croon - From [facility] (cardiology) and was seen on 04/05/2015. "She noted roughly 6 months of off and on spells of sudden heart racing associated with shortness of breath and cough. Symptoms lasting maybe 1-2 minutes. No associated dizziness lightheadedness syncope or near syncope.  Does not drink caffeine and has cut down on TEE. Denies eating excess sugar or taking decongestants. Symptoms noted associated with anxiety and stress. Less noted when not going to her job".  At that time she denied any chest pain tightness or pressure or exertional dyspnea. No PND or orthopnea. Also denies any swelling of her legs. -- Given diagnosis of Hypertrophic Cardiomyopathy based on echocardiogram revealing mild to moderate concentric LVH with mild diffuse hypokinesis --> started on low-dose metoprolol succinate 25 mg daily. Advised to avoid taking decongestants and caffeine. Discussed primary prevention. --> Apparently she was not happy with this. She was very concerned that her ejection fraction was considered 'LOW' @ 50%.  She is very worried about this because it is her heart.  Recent Hospitalizations: ER visit in January for her tachycardia and shortness of  breath. "She reports several months of episodes of dyspnea that are intermittent. does admit to some episodes of anxiety but not universally. No pleuritic discomfort. No fever. No prolonged immobilization cast splints surgeries malignancies or other DVT or PE risk"  - resting sinus tachycardia -- ? Anxiety.  Rx BB & prn Ativan.  Studies Reviewed:   Echocardiogram 03/02/2015 [facility]): Normal LV size, mild-moderate concentric LVH and mild diffuse hypokinesis with low normal EF 50%. Mild left atrial dilation. Atrial septal "dropout" cannot exclude small ASD or PFO. Mild TR. Mild MR.  CT angiogram of the chest 02/26/2015: No PE. No acute cardiac pulmonary process.  Chest x-ray 02/26/2015: Minimal bronchitic changes and by basilar atelectasis without infiltrate.  Cardiac event monitor February 2017 [facility]: Sinus rhythm with occasional sinus tachycardia. Rates as high as 122. No arrhythmias seen.  04/27/2015 Left Lower Extremity Venous Dopplers: No evidence of DVT in left lower STEMI as well as right common femoral vein.  Ordered: Lower Extremity Venous Ultrasound for Reflux - > has not been done  Interval History: Laurie [name] presents today for second opinion. She was not happy with what she was told by the original cardiologist. She says that since the episode in January she has been less active, not exercising as much has gained about 30 pounds. She says that she is short of breath now with exertion and can happen also at rest. Hazily notes episodes of sitting up to catch her breath, but denies any orthopnea. She says the palpitations have reduced somewhat improved after starting metoprolol, but still happening with some consistency. What she does  note is some occasional fullness in her chest when her heart is pounding. This oftentimes can be associated with exertion and shortness of breath. She says that her feet are swollen and puffy. No dizziness, weakness or  syncope/near syncope. No TIA/amaurosis fugax symptoms. No melena, hematochezia, hematuria, or epstaxis. No claudication.  ROS: A comprehensive was performed. Review of Systems  Constitutional: Positive for chills and malaise/fatigue. Negative for fever, weight loss (Weight gain) and diaphoresis.  HENT: Negative for congestion.   Respiratory: Positive for cough (Less frequent). Negative for wheezing.   Cardiovascular: Positive for chest pain, palpitations and leg swelling.  Gastrointestinal: Positive for heartburn and abdominal pain. Negative for constipation, blood in stool and melena.  Genitourinary: Negative for hematuria and flank pain.  Musculoskeletal: Positive for myalgias and joint pain. Negative for falls.  Neurological: Negative for weakness and headaches.  Endo/Heme/Allergies: Does not bruise/bleed easily.  Psychiatric/Behavioral: Negative for memory loss. The patient is nervous/anxious. The patient does not have insomnia.   All other systems reviewed and are negative.   Past Medical History  Diagnosis Date  . Allergic rhinitis   . Endometriosis   . Arthritis of right hip   . Fibromyalgia   . Arthritis of knee   . Sinus tachycardia (HCC)   . Obesity, unspecified 07/04/05  . Eczematous dermatitis 11/08/10  . Paresthesias/numbness 11/14/11  . Menopause 11/14/11  . Borderline diabetes 11/14/11  . Gout 02/08/12    idopathic gout, left ankle and foot  . Premature menopause 07/09/13  . Gynecomastia 11/18/13    hypertrophy of breast  . Acne vulgaris 01/19/15  . Anxiety 02/18/15  . Spider veins of both lower extremities 04/12/15  . 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 Esophageal reflux   . Edema of both legs     Past Surgical History  Procedure Laterality Date  . Total hip arthroplasty  05/23/2006    Prior to Admission medications   Medication Sig Start Date Taking? Authorizing Provider  allopurinol (ZYLOPRIM) 100 MG  tablet Take 100 mg by mouth daily. 07/28/14 Yes Historical Provider, MD  Biotin (BIOTIN 5000) 5 MG CAPS Take 2 capsules by mouth daily.  Yes Historical Provider, MD  calcium carbonate (OS-CAL) 600 MG TABS Take 600 mg by mouth daily.    Yes Historical Provider, MD  LORazepam (ATIVAN) 1 MG tablet Take 1 tablet (1 mg total) by mouth 2 (two) times daily. 02/26/15 Yes Rolland Porter, MD  lubiprostone (AMITIZA) 24 MCG capsule Take 24 mcg by mouth 2 (two) times daily with a meal.  Yes Historical Provider, MD  Metoprolol succinate (Toprol) Take 1 tablet (25 mg) by mouth times daily. 02/26/15 Yes Rolland Porter, MD  mirabegron ER (MYRBETRIQ) 25 MG TB24 tablet Take 25 mg by mouth daily.  Yes Historical Provider, MD  Multiple Vitamins-Minerals (MULTIVITAMINS THER. W/MINERALS) TABS Take 1 tablet by mouth daily.    Yes Historical Provider, MD     Allergies  Allergen Reactions  . Sulfa Antibiotics Swelling    Social History   Social History  . Marital Status: Single    Spouse Name: N/A  . Number of Children: N/A  . Years of Education: N/A   Social History Main Topics  . Smoking status: Never Smoker   . Smokeless tobacco: Never Used  . Alcohol Use: No  . Drug Use: No  . Sexual Activity: Not Asked   Other Topics Concern  . None   Social History Narrative   Married now for  3 years. Mother of one.   This with her spouse. 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 Does not exercise.   Family History  Problem Relation Age of Onset  . Healthy Father   . Cancer Mother   . Heart failure Maternal Aunt      Wt Readings from Last 3 Encounters:  07/23/15 232 lb (105.235 kg)  02/26/15 200 lb (90.719 kg)  07/01/12 200 lb (90.719 kg)    PHYSICAL EXAM BP 128/86 mmHg  Pulse 94  Ht 5\' 7"  (1.702 m)  Wt 232 lb (105.235 kg)  BMI 36.33 kg/m2  SpO2 98% General appearance: alert, cooperative, appears stated age, no distress and Moderately  obese -she states the most weight she is put on his been in her "chest" abdomen and bottom HEENT: [place]/AT, EOMI, MMM, anicteric sclera Neck: no adenopathy, no carotid bruit and no JVD Lungs: clear to auscultation bilaterally, normal percussion bilaterally and non-labored Heart: regular rate and rhythm, S1, S2 normal, no murmur, click, rub or gallop - unable to palpate PMI Abdomen: soft, non-tender; bowel sounds normal; no masses,  no organomegaly; unable to palpate HJR or HSM Extremities: extremities normal, atraumatic, no cyanosis, and edema 1+ bilateral Pulses: 2+ and symmetric;  Skin: mobility and turgor normal and no evidence of bleeding or bruising Neurologic: Mental status: Alert, oriented, thought content appropriate Cranial nerves: normal (II-XII grossly intact)    Adult ECG Report Not checked  Other studies Reviewed: Additional studies/ records that were reviewed today include:  Recent Labs:  No new labs pertinent   ASSESSMENT / PLAN: Problem List Items Addressed This Visit    None    Visit Diagnoses    Exertional dyspnea    -  Primary    Relevant Orders    ECHO STRESS TEST    Chest pain with moderate risk for cardiac etiology        Relevant Orders    ECHO STRESS TEST    Other hypertrophic cardiomyopathy (HCC)        Relevant Medications    metoprolol succinate (TOPROL-XL) 25 MG 24 hr tablet    Heart palpitations           Laurie [name] is very concerned about her symptoms and the diagnosis of cardiomyopathy. I don't know that it truly meets the diagnosis for hypertrophic cardiomyopathy. She really doesn't have that much the way of hypertension. Her EF was 50% and I reassured her that is still in the "normal range. There was no description of diastolic function on the echocardiogram, so I can't assess filling pressures to see if this could be contributing to her dyspnea. With obesity and hypertension and chest fullness and with dyspnea on exertion, I think is reasonable to  evaluate her for an ischemic etiology for chest pain. Is at least moderate risk. Plan: For now as treadmill stress echocardiogram. This will give us symptom related data as well as imaging which will be compared to the outside echocardiogram.  I don't think we need to repeat an event monitor, because there was no sign of any untoward arrhythmias. I think she may just be having some sinus tachycardia or PVCs. At present, her blood pressure is well-controlled, but her heart rate is elevated, we could consider increasing beta blocker dose. I would like to see how she does stress test first. I think this can probably be managed by her PCP if there is  no evidence of ischemia. I explained in detail what the ejection fraction of 50% means, and likely possibility of diastolic dysfunction. I explained to her that really she should not be overly symptomatic with this finding on echocardiogram.  Mostly she needs reassurance that she should be okay. She does not have a significant family history, so I hope for the best for the results. Plan: Continue current dose of metoprolol for now.   Studies Ordered:   Orders Placed This Encounter  Procedures  . ECHO STRESS TEST     Current medicines are reviewed at length with the patient today. (+/- concerns) none The following changes have been made: none  ROV in July.  The patient had a very positive review of systems with multiple symptoms, multiple comorbidities and multiple complaints. She had a large stack of paperwork that had to be reviewed including PCP notes, cardiology notes and studies not included in epic. Time to complete chart review well over 45 minutes. I then spent at least 40 minutes with the patient. Greater than 50% of the time was in direct consultation.    Bryan Lemmaavid Harding, M.D., M.S. Interventional Cardiologist   Pager # [phone] Phone # [phone] [address] [place], [place] [address]

## 2015-07-23 ENCOUNTER — Encounter: Payer: Self-pay | Admitting: Cardiology

## 2015-07-23 ENCOUNTER — Ambulatory Visit (INDEPENDENT_AMBULATORY_CARE_PROVIDER_SITE_OTHER): Payer: BLUE CROSS/BLUE SHIELD | Admitting: Cardiology

## 2015-07-23 VITALS — BP 128/86 | HR 94 | Ht 67.0 in | Wt 232.0 lb

## 2015-07-23 DIAGNOSIS — R0609 Other forms of dyspnea: Secondary | ICD-10-CM | POA: Diagnosis not present

## 2015-07-23 DIAGNOSIS — R079 Chest pain, unspecified: Secondary | ICD-10-CM | POA: Diagnosis not present

## 2015-07-23 DIAGNOSIS — R0600 Dyspnea, unspecified: Secondary | ICD-10-CM

## 2015-07-23 DIAGNOSIS — I422 Other hypertrophic cardiomyopathy: Secondary | ICD-10-CM

## 2015-07-23 DIAGNOSIS — R002 Palpitations: Secondary | ICD-10-CM

## 2015-07-23 NOTE — Patient Instructions (Signed)
Your physician has requested that you have a stress echocardiogram @ [address]. Echocardiography is a painless test that uses sound waves to create images of your heart. It provides your doctor with information about the size and shape of your heart and how well your heart's chambers and valves are working. This procedure takes approximately one hour. There are no restrictions for this procedure.  Your physician recommends that you schedule a follow-up appointment after your test with Dr. Herbie BaltimoreHarding in July

## 2015-07-25 ENCOUNTER — Encounter: Payer: Self-pay | Admitting: Cardiology

## 2015-07-30 ENCOUNTER — Encounter: Payer: Self-pay | Admitting: Vascular Surgery

## 2015-07-31 HISTORY — PX: OTHER SURGICAL HISTORY: SHX169

## 2015-08-06 DIAGNOSIS — Z79899 Other long term (current) drug therapy: Secondary | ICD-10-CM | POA: Diagnosis not present

## 2015-08-06 DIAGNOSIS — Z96641 Presence of right artificial hip joint: Secondary | ICD-10-CM | POA: Diagnosis not present

## 2015-08-06 DIAGNOSIS — F329 Major depressive disorder, single episode, unspecified: Secondary | ICD-10-CM | POA: Diagnosis not present

## 2015-08-06 DIAGNOSIS — M7581 Other shoulder lesions, right shoulder: Secondary | ICD-10-CM | POA: Diagnosis not present

## 2015-08-06 DIAGNOSIS — Z882 Allergy status to sulfonamides status: Secondary | ICD-10-CM | POA: Diagnosis not present

## 2015-08-06 DIAGNOSIS — G4700 Insomnia, unspecified: Secondary | ICD-10-CM | POA: Diagnosis not present

## 2015-08-06 DIAGNOSIS — M797 Fibromyalgia: Secondary | ICD-10-CM | POA: Diagnosis not present

## 2015-08-10 ENCOUNTER — Ambulatory Visit (INDEPENDENT_AMBULATORY_CARE_PROVIDER_SITE_OTHER): Payer: 59 | Admitting: Vascular Surgery

## 2015-08-10 ENCOUNTER — Ambulatory Visit (HOSPITAL_COMMUNITY)
Admission: RE | Admit: 2015-08-10 | Discharge: 2015-08-10 | Disposition: A | Discharge status: Home / Self Care | Payer: 59 | Source: Ambulatory Visit | Attending: Vascular Surgery | Admitting: Vascular Surgery

## 2015-08-10 ENCOUNTER — Encounter: Payer: Self-pay | Admitting: Vascular Surgery

## 2015-08-10 VITALS — BP 141/88 | HR 60 | Temp 97.9°F | Resp 16 | Ht 67.0 in | Wt 226.0 lb

## 2015-08-10 DIAGNOSIS — F419 Anxiety disorder, unspecified: Secondary | ICD-10-CM | POA: Diagnosis not present

## 2015-08-10 DIAGNOSIS — K219 Gastro-esophageal reflux disease without esophagitis: Secondary | ICD-10-CM | POA: Diagnosis not present

## 2015-08-10 DIAGNOSIS — R609 Edema, unspecified: Secondary | ICD-10-CM | POA: Diagnosis present

## 2015-08-10 DIAGNOSIS — I83892 Varicose veins of left lower extremities with other complications: Secondary | ICD-10-CM | POA: Diagnosis not present

## 2015-08-10 DIAGNOSIS — R7303 Prediabetes: Secondary | ICD-10-CM | POA: Diagnosis not present

## 2015-08-10 DIAGNOSIS — I8393 Asymptomatic varicose veins of bilateral lower extremities: Secondary | ICD-10-CM

## 2015-08-10 NOTE — Progress Notes (Signed)
Subjective:     Patient ID: Laurie [name], female   DOB: 05/27/1965, 50 y.o.   MRN: [id number]  HPI this 50 year old female is evaluated for pain in her lower extremities with varicose veins. She has no history of DVT thrombophlebitis stasis ulcers or bleeding. She has noted some prominent veins in her thighs particular on the left recently and she has some stinging burning and itching discomfort particularly over the left lateral distal thigh. She has no history of previous venous procedures. She does not were elastic compression stockings. She denies distal edema.  Past Medical History  Diagnosis Date  . Allergic rhinitis   . Endometriosis   . Arthritis of right hip   . Fibromyalgia   . Arthritis of knee   . Sinus tachycardia (HCC)   . Obesity, unspecified 07/04/05  . Eczematous dermatitis 11/08/10  . Paresthesias/numbness 11/14/11  . Menopause 11/14/11  . Borderline diabetes 11/14/11  . Gout 02/08/12    idopathic gout, left ankle and foot  . Premature menopause 07/09/13  . Gynecomastia 11/18/13    hypertrophy of breast  . Acne vulgaris 01/19/15  . Anxiety 02/18/15  . Spider veins of both lower extremities 04/12/15  . 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 Esophageal reflux   . Edema of both legs   . Varicose veins     Social History  Substance Use Topics  . Smoking status: Never Smoker   . Smokeless tobacco: Never Used  . Alcohol Use: No    Family History  Problem Relation Age of Onset  . Healthy Father   . Cancer Mother   . Heart failure Maternal Aunt     Allergies  Allergen Reactions  . Sulfa Antibiotics Swelling     Current outpatient prescriptions:  .  allopurinol (ZYLOPRIM) 100 MG tablet, Take 100 mg by mouth daily., Disp: , Rfl:  .  Biotin (BIOTIN 5000) 5 MG CAPS, Take 2 capsules by mouth daily., Disp: , Rfl:  .  lubiprostone (AMITIZA) 24 MCG capsule, Take 24 mcg by mouth 2 (two) times daily with a meal.,  Disp: , Rfl:  .  metoprolol succinate (TOPROL-XL) 25 MG 24 hr tablet, Take 25 mg by mouth daily., Disp: , Rfl:  .  mirabegron ER (MYRBETRIQ) 25 MG TB24 tablet, Take 25 mg by mouth daily., Disp: , Rfl:  .  Multiple Vitamins-Minerals (MULTIVITAMINS THER. W/MINERALS) TABS, Take 1 tablet by mouth daily.  , Disp: , Rfl:  .  calcium carbonate (OS-CAL) 600 MG TABS, Take 600 mg by mouth daily. Reported on 08/10/2015, Disp: , Rfl:  .  LORazepam (ATIVAN) 1 MG tablet, Take 1 tablet (1 mg total) by mouth 2 (two) times daily. (Patient not taking: Reported on 08/10/2015), Disp: 15 tablet, Rfl: 0  Filed Vitals:   08/10/15 1505  BP: 141/88  Pulse: 60  Temp: 97.9 F (36.6 C)  Resp: 16  Height: 5\' 7"  (1.702 m)  Weight: 226 lb (102.513 kg)  SpO2: 98%    Body mass index is 35.39 kg/(m^2).           Review of Systems denies chest pain, dyspnea on exertion, PND, orthopnea, hemoptysis     Objective:   Physical Exam BP 141/88 mmHg  Pulse 60  Temp(Src) 97.9 F (36.6 C)  Resp 16  Ht 5\' 7"  (1.702 m)  Wt 226 lb (102.513 kg)  BMI 35.39 kg/m2  SpO2 98%    Gen.-alert and oriented x3 in no apparent distress  HEENT normal for age Lungs no rhonchi or wheezing Cardiovascular regular rhythm no murmurs carotid pulses 3+ palpable no bruits audible Abdomen soft nontender no palpable masses Musculoskeletal free of  major deformities Skin clear -no rashes Neurologic normal Lower extremities 3+ femoral and dorsalis pedis pulses palpable bilaterally with no edema Prominent patch of spider and reticular veins and distal thigh on the left laterally. A few spider veins bilaterally in the anterior and medial thigh areas area no hyperpigmentation or ulceration noted distally. No bulging varicosities noted.  Today I ordered a left leg venous duplex exam which I reviewed and interpreted. There is no DVT. There is some reflux in the deep system in the popliteal vein. There is a very short area of reflux in the  great saphenous vein near the knee the remainder of the study is totally normal       Assessment:     Bilateral spider veins with no significant superficial venous reflux    Plan:     Discussed with patient that treatment would involve foam sclerotherapy but no guarantee that this would remove relieve her pain She will consider this and if she would be interested she will contact Clementeen Hoof for further information

## 2015-08-13 ENCOUNTER — Other Ambulatory Visit: Payer: Self-pay | Admitting: Internal Medicine

## 2015-08-13 DIAGNOSIS — Z1231 Encounter for screening mammogram for malignant neoplasm of breast: Secondary | ICD-10-CM

## 2015-08-16 ENCOUNTER — Telehealth (HOSPITAL_COMMUNITY): Payer: Self-pay | Admitting: *Deleted

## 2015-08-16 NOTE — Telephone Encounter (Signed)
Patient given detailed instructions per Stress Test Requisition Sheet for test on 08/18/15 at 7:30.Patient Notified to arrive 30 minutes early, and that it is imperative to arrive on time for appointment to keep from having the test rescheduled.  Patient verbalized understanding. Daneil DolinSharon S [name]

## 2015-08-17 ENCOUNTER — Telehealth (HOSPITAL_COMMUNITY): Payer: Self-pay | Admitting: Radiology

## 2015-08-17 NOTE — Telephone Encounter (Signed)
Patient given detailed instructions for the test on 08/18/2015 at 7:30. Patient notified to arrive 15 minutes early and that it is imperative to arrive on time for appointment to keep from having the test rescheduled.  If you need to cancel or reschedule your appointment, please call the office within 24 hours of your appointment. Failure to do so may result in a cancellation of your appointment, and a $50 no show fee. Patient verbalized understanding.ehk

## 2015-08-18 ENCOUNTER — Encounter (INDEPENDENT_AMBULATORY_CARE_PROVIDER_SITE_OTHER): Payer: Self-pay

## 2015-08-18 ENCOUNTER — Ambulatory Visit (HOSPITAL_BASED_OUTPATIENT_CLINIC_OR_DEPARTMENT_OTHER): Payer: 59

## 2015-08-18 ENCOUNTER — Ambulatory Visit (HOSPITAL_COMMUNITY): Payer: 59 | Attending: Cardiovascular Disease

## 2015-08-18 DIAGNOSIS — R079 Chest pain, unspecified: Secondary | ICD-10-CM | POA: Diagnosis not present

## 2015-08-18 DIAGNOSIS — R0600 Dyspnea, unspecified: Secondary | ICD-10-CM | POA: Diagnosis present

## 2015-08-18 DIAGNOSIS — R0609 Other forms of dyspnea: Secondary | ICD-10-CM

## 2015-08-19 ENCOUNTER — Telehealth: Payer: Self-pay | Admitting: *Deleted

## 2015-08-19 NOTE — Telephone Encounter (Signed)
When patient notified of echo results she complained of having left sided chest discomfort and wanted me to ask Dr Herbie BaltimoreHarding when she needs to be seen since her echo was normal. He informed me to have her come in on his next "short day." Patient notified of his response. I will have the scheduler to call patient back with appointment information.

## 2015-08-19 NOTE — Telephone Encounter (Signed)
-----   Message from David W Harding, MD sent at 08/18/2015 10:23 PM EDT ----- Good news. The stress echocardiogram was essentially normal. Baseline echocardiogram was normal pump function with normal valve function. EKG portion of the stress test was negative, and as expected, the ventricle had no changes and wall motion to suggest any evidence of significant heart artery disease or heart attack.  David Harding, MD  

## 2015-08-19 NOTE — Telephone Encounter (Signed)
-----   Message from Marykay Lexavid W Harding, MD sent at 08/18/2015 10:23 PM EDT ----- Peri JeffersonGood news. The stress echocardiogram was essentially normal. Baseline echocardiogram was normal pump function with normal valve function. EKG portion of the stress test was negative, and as expected, the ventricle had no changes and wall motion to suggest any evidence of significant heart artery disease or heart attack.  Bryan Lemmaavid Harding, MD

## 2015-08-19 NOTE — Telephone Encounter (Signed)
Left message to return a call to discuss echo results.

## 2015-08-26 ENCOUNTER — Ambulatory Visit (INDEPENDENT_AMBULATORY_CARE_PROVIDER_SITE_OTHER): Payer: 59 | Admitting: Cardiology

## 2015-08-26 ENCOUNTER — Telehealth: Payer: Self-pay | Admitting: Cardiology

## 2015-08-26 ENCOUNTER — Encounter: Payer: Self-pay | Admitting: Cardiology

## 2015-08-26 DIAGNOSIS — R0602 Shortness of breath: Secondary | ICD-10-CM

## 2015-08-26 DIAGNOSIS — R002 Palpitations: Secondary | ICD-10-CM | POA: Diagnosis not present

## 2015-08-26 DIAGNOSIS — R079 Chest pain, unspecified: Secondary | ICD-10-CM

## 2015-08-26 DIAGNOSIS — E669 Obesity, unspecified: Secondary | ICD-10-CM | POA: Diagnosis not present

## 2015-08-26 MED ORDER — METOPROLOL SUCCINATE ER 25 MG PO TB24: 25.0000 mg | ORAL_TABLET | Freq: Every day | ORAL | 3 refills | Status: AC

## 2015-08-26 NOTE — Telephone Encounter (Signed)
New message  *STAT* If patient is at the pharmacy, call can be transferred to refill team.   1. Which medications need to be refilled? (please list name of each medication and dose if known)  Metoprolol 25mg    2. Which pharmacy/location (including street and city if local pharmacy) is medication to be sent to? [facility]  3. Do they need a 30 day or 90 day supply? 90

## 2015-08-26 NOTE — Patient Instructions (Signed)
Continue your current course of treatment. Dr Herbie Baltimore will see you only as needed. You are to return to the care of your primary care physician.

## 2015-08-26 NOTE — Telephone Encounter (Signed)
Refill sent to the pharmacy electronically.  

## 2015-08-26 NOTE — Progress Notes (Signed)
PCP: Laurie German, MD  - Rheumatologist: [facility]  Clinic Note: Chief Complaint  Patient presents with  . Follow-up  . Shortness of Breath    off and on   . Edema    HPI: Laurie [name] is a 50 y.o. female with a PMH below who presents today for Follow-up visit to discuss results of studies.. - She was referred to Laurie [name] - From [facility] (cardiology) and was seen on 04/05/2015. "She noted roughly 6 months of off and on spells of sudden heart racing associated with shortness of breath and cough. Symptoms lasting maybe 1-2 minutes. No associated dizziness lightheadedness syncope or near syncope.  Does not drink caffeine and has cut down on TEE. Denies eating excess sugar or taking decongestants. Symptoms noted associated with anxiety and stress. Less noted when not going to her job".  At that time she denied any chest pain tightness or pressure or exertional dyspnea. No PND or orthopnea. Also denies any swelling of her legs. -- Given diagnosis of Hypertrophic Cardiomyopathy based on echocardiogram revealing mild to moderate concentric LVH with mild diffuse hypokinesis --> started on low-dose metoprolol succinate 25 mg daily. Advised to avoid taking decongestants and caffeine. Discussed primary prevention.Her palpitations had mostly improved with metoprolol, but are still occasionally present --> Apparently she was not happy with this. She was very concerned that her ejection fraction was considered 'LOW' @ 50%.  She is very worried about this because it is her heart.  Laurie [name] was seen for initial consultation on 07/23/2015 for a "second opinion" with cardiology. -- She has been dealing with issues of lower TIMI edema swelling and some heart racing as well as dyspnea. A lot of the dyspnea is dyspneic with exertion.  We ordered an Treadmill Stress Echocardiogram to evaluate for her dyspnea. I reassured her of the results of her event  monitors for palpitations. We continued to beta blocker.  Recent Hospitalizations: None  Studies Reviewed:   Treadmill Stress Echocardiogram 08/18/2015: No stress induced arrhythmias or conduction abnormality. EKG negative for ischemia. No echocardiographic evidence for stress-induced ischemia. Baseline echocardiogram showed EF of 55% with no regional wall motion abnormalities, this improved to 65% with exercise.   Interval History: Laurie [name] presents today for follow-up. She was happy to review the results of her stress test, but is concerned that she still can't figure out why she has dyspnea. Her dyspnea is really present with or without exertion. Can happen simply with sitting or with lying down. It's not necessarily exertional and she does not have any exertional chest pain. Her palpitations have definitely improved with her current dose of metoprolol. She is noticing less of the fatigue that she had. No longer really commenting on any swelling or edema. No resting or exertional chest tightness/pressure.  She and I had a frank discussion about why we thought she was short of breath, and 1 factor did come up that she agreed that 2 other physicians had mentioned to her. This is that she has definitely noted an increased size in her breasts over the last year or 2 that she cannot explain. She is always have the legs and hips weight, but the breast weight has deathly gotten worse. There was a suggestion of possible Pearl for breast reduction surgery.  No PND, orthopnea. No dizziness, weakness or syncope/near syncope. No TIA/amaurosis fugax symptoms. No melena, hematochezia, hematuria, or epstaxis. No claudication.  ROS: A comprehensive was performed. Review of Systems  Constitutional: Negative for chills, diaphoresis, fever, malaise/fatigue and weight loss (Weight gain).  HENT: Negative for congestion.   Respiratory: Negative for cough (Less frequent) and wheezing.   Cardiovascular: Positive  for palpitations (Notably improved) and leg swelling (Off-and-on). Negative for chest pain.  Gastrointestinal: Positive for abdominal pain and heartburn. Negative for blood in stool, constipation and melena.  Genitourinary: Negative for flank pain and hematuria.  Musculoskeletal: Positive for joint pain and myalgias. Negative for falls.  Neurological: Negative for weakness and headaches.  Endo/Heme/Allergies: Does not bruise/bleed easily.  Psychiatric/Behavioral: Negative for memory loss. The patient is nervous/anxious. The patient does not have insomnia.   All other systems reviewed and are negative.   Past Medical History:  Diagnosis Date  . Acne vulgaris 01/19/15  . Allergic rhinitis   . Anxiety 02/18/15  . Arthritis of knee   . Arthritis of right hip   . Borderline diabetes 11/14/11  . Eczematous dermatitis 11/08/10  . Edema of both legs   . Endometriosis   . Esophageal reflux   . Fibromyalgia   . Gout 02/08/12   idopathic gout, left ankle and foot  . Gynecomastia 11/18/13   hypertrophy of breast  . Menopause 11/14/11  . Obesity, unspecified 07/04/05  . Paresthesias/numbness 11/14/11  . Premature menopause 07/09/13  . Sinus tachycardia (HCC)   . Spider veins of both lower extremities 04/12/15  . Varicose veins   . Varicose veins of bilateral lower extremities with other complications 04/26/15    Past Surgical History:  Procedure Laterality Date  . Cardiac Event Monitor  03/2015   [facility]: Sinus rhythm with occasional sinus tachycardia. Rates as high as 122. No arrhythmias seen.  Marland Kitchen TOTAL HIP ARTHROPLASTY  05/23/2006  . TRANSTHORACIC ECHOCARDIOGRAM  03/02/2015   [facility], Laurie [name]: Normal LV size, mild-moderate concentric LVH with mild diffuse HK. EF~50%. Mild LA dilation. Could not exclude small ASD/PFO due to atrial septal dropout. Mild TR, mild MR.    Prior to Admission medications   Medication Sig Start Date Taking? Authorizing  Provider  allopurinol (ZYLOPRIM) 100 MG tablet Take 100 mg by mouth daily. 07/28/14 Yes Historical Provider, MD  Biotin (BIOTIN 5000) 5 MG CAPS Take 2 capsules by mouth daily.  Yes Historical Provider, MD  calcium carbonate (OS-CAL) 600 MG TABS Take 600 mg by mouth daily.    Yes Historical Provider, MD  LORazepam (ATIVAN) 1 MG tablet Take 1 tablet (1 mg total) by mouth 2 (two) times daily. 02/26/15 Yes Rolland Porter, MD  lubiprostone (AMITIZA) 24 MCG capsule Take 24 mcg by mouth 2 (two) times daily with a meal.  Yes Historical Provider, MD  Metoprolol succinate (Toprol) Take 1 tablet (25 mg) by mouth times daily. 02/26/15 Yes Rolland Porter, MD  mirabegron ER (MYRBETRIQ) 25 MG TB24 tablet Take 25 mg by mouth daily.  Yes Historical Provider, MD  Multiple Vitamins-Minerals (MULTIVITAMINS THER. W/MINERALS) TABS Take 1 tablet by mouth daily.    Yes Historical Provider, MD     Allergies  Allergen Reactions  . Sulfa Antibiotics Swelling    Social History   Social History  . Marital status: Single    Spouse name: N/A  . Number of children: N/A  . Years of education: N/A   Social History Main Topics  . Smoking status: Never Smoker  . Smokeless tobacco: Never Used  . Alcohol use No  . Drug use: No  . Sexual activity: Not Asked   Other Topics Concern  . None   Social  History Narrative   Married now for 3 years. Mother of one.   This with her spouse. Is a Estate manager/land agent for [employer]   Does not drink does not smoke and does not use illicit drugs. Does not exercise.   Family History  Problem Relation Age of Onset  . Healthy Father   . Cancer Mother   . Heart failure Maternal Aunt      Wt Readings from Last 3 Encounters:  08/26/15 232 lb (105.2 kg)  08/10/15 226 lb (102.5 kg)  07/23/15 232 lb (105.2 kg)    PHYSICAL EXAM BP 128/88   Pulse 91   Ht 5\' 7"  (1.702 m)   Wt 232 lb (105.2 kg)   BMI 36.34 kg/m  General appearance: alert, cooperative, appears stated age, no  distress and Moderately obese -she states the most weight she is put on his been in her "chest" abdomen and bottom HEENT: /AT, EOMI, MMM, anicteric sclera Neck: no adenopathy, no carotid bruit and no JVD Lungs: clear to auscultation bilaterally, normal percussion bilaterally and non-labored Heart: regular rate and rhythm, S1, S2 normal, no murmur, click, rub or gallop - unable to palpate PMI Abdomen: soft, non-tender; bowel sounds normal; no masses,  no organomegaly; unable to palpate HJR or HSM Extremities: extremities normal, atraumatic, no cyanosis, and edema 1+ bilateral Pulses: 2+ and symmetric;  Skin: mobility and turgor normal and no evidence of bleeding or bruising Neurologic: Mental status: Alert, oriented, thought content appropriate Cranial nerves: normal (II-XII grossly intact)    Adult ECG Report Not checked  Other studies Reviewed: Additional studies/ records that were reviewed today include:  Recent Labs:  No new labs pertinent   ASSESSMENT / PLAN: Problem List Items Addressed This Visit    Shortness of breath    Her shortness of breath is really intermittent, and not necessarily exertional. Her stress echocardiogram looked great. I doubt the significance of "mild-moderate LVH "with some diastolic dysfunction as far as contributing to her dyspnea. Mostly because is not always exertional. More likely, it could be related to the size of her breasts and leading to dyspnea with lying down as well as with exercise.  At this point, I think weight loss is her most important management option, and as opposed to gastric bypass surgery, I think breast reduction surgery may very well help her lose the rest the weight because it will help her walk more.      Obesity, unspecified    We discussed weight loss options. I do think that overall general body weight loss is warranted and would be beneficial for her, but I think she has gained a significant amount weight a lot of it per her  report is in the breast area. I do think she would benefit from breast reduction surgery. This will help her get active enough to exercise and lose weight via a natural way.      Intermittent palpitations    No significant findings noted on her monitor. I don't think she is truly having arrhythmia. I would continue beta blocker.      Chest pain with minimal risk for cardiac etiology    Essentially normal treadmill echocardiogram with normal LV function. Unlikely to be ischemic cardiac chest pain.       Other Visit Diagnoses   None.    The stress echocardiogram was essentially normal. Baseline EF was in the normal range, not 50. There were no regional wall motion abnormalities. There was no suggestion of  this being hypertrophic cardiomyopathy. At this point I would simply treat her blood pressure and work on weight loss.   Studies Ordered:   No orders of the defined types were placed in this encounter.    Current medicines are reviewed at length with the patient today. (+/- concerns) none The following changes have been made: none  ROV  As needed    Bryan Lemma, M.D., M.S. Interventional Cardiologist   Pager # [phone] Phone # [phone] [address] [place], [place] [address]

## 2015-08-27 MED FILL — VIT D2 1.25 MG (50,000 UNIT: 1.25 MG | 84 days supply | Qty: 12 | Fill #0

## 2015-08-27 MED FILL — METOPROLOL SUCC ER 25 MG TA: 25 | 90 days supply | Qty: 90 | Fill #0

## 2015-08-27 MED FILL — FOLIC ACID 1 MG TABLET: 1 | 90 days supply | Qty: 90 | Fill #0

## 2015-08-28 ENCOUNTER — Encounter: Payer: Self-pay | Admitting: Cardiology

## 2015-08-28 DIAGNOSIS — R002 Palpitations: Secondary | ICD-10-CM | POA: Insufficient documentation

## 2015-08-28 DIAGNOSIS — R079 Chest pain, unspecified: Secondary | ICD-10-CM | POA: Insufficient documentation

## 2015-08-28 NOTE — Assessment & Plan Note (Signed)
No significant findings noted on her monitor. I don't think she is truly having arrhythmia. I would continue beta blocker.

## 2015-08-28 NOTE — Assessment & Plan Note (Signed)
Essentially normal treadmill echocardiogram with normal LV function. Unlikely to be ischemic cardiac chest pain.

## 2015-08-28 NOTE — Assessment & Plan Note (Signed)
Her shortness of breath is really intermittent, and not necessarily exertional. Her stress echocardiogram looked great. I doubt the significance of "mild-moderate LVH "with some diastolic dysfunction as far as contributing to her dyspnea. Mostly because is not always exertional. More likely, it could be related to the size of her breasts and leading to dyspnea with lying down as well as with exercise.  At this point, I think weight loss is her most important management option, and as opposed to gastric bypass surgery, I think breast reduction surgery may very well help her lose the rest the weight because it will help her walk more.

## 2015-08-28 NOTE — Assessment & Plan Note (Signed)
We discussed weight loss options. I do think that overall general body weight loss is warranted and would be beneficial for her, but I think she has gained a significant amount weight a lot of it per her report is in the breast area. I do think she would benefit from breast reduction surgery. This will help her get active enough to exercise and lose weight via a natural way.

## 2015-08-30 DIAGNOSIS — Z6836 Body mass index (BMI) 36.0-36.9, adult: Secondary | ICD-10-CM | POA: Diagnosis not present

## 2015-08-30 DIAGNOSIS — I83813 Varicose veins of bilateral lower extremities with pain: Secondary | ICD-10-CM | POA: Diagnosis not present

## 2015-08-30 DIAGNOSIS — R000 Tachycardia, unspecified: Secondary | ICD-10-CM | POA: Diagnosis not present

## 2015-08-30 DIAGNOSIS — R7301 Impaired fasting glucose: Secondary | ICD-10-CM | POA: Diagnosis not present

## 2015-08-30 DIAGNOSIS — M609 Myositis, unspecified: Secondary | ICD-10-CM | POA: Diagnosis not present

## 2015-08-30 DIAGNOSIS — N62 Hypertrophy of breast: Secondary | ICD-10-CM | POA: Diagnosis not present

## 2015-08-30 DIAGNOSIS — E669 Obesity, unspecified: Secondary | ICD-10-CM | POA: Diagnosis not present

## 2015-08-30 MED FILL — DULoxetine HCL 30 MG CPEP: 30 | 30 days supply | Qty: 30 | Fill #0

## 2015-08-31 ENCOUNTER — Ambulatory Visit
Admission: RE | Admit: 2015-08-31 | Discharge: 2015-08-31 | Disposition: A | Discharge status: Home / Self Care | Payer: 59 | Source: Ambulatory Visit | Attending: Internal Medicine | Admitting: Internal Medicine

## 2015-08-31 DIAGNOSIS — Z1231 Encounter for screening mammogram for malignant neoplasm of breast: Secondary | ICD-10-CM | POA: Diagnosis not present

## 2015-08-31 MED FILL — ALLOPURINOL 100 MG TABLET: 100 | 30 days supply | Qty: 30 | Fill #0

## 2015-09-02 MED FILL — CYCLOBENZAPRINE 5 MG TABLET: 5 | 10 days supply | Qty: 30 | Fill #0

## 2015-09-23 DIAGNOSIS — Z01419 Encounter for gynecological examination (general) (routine) without abnormal findings: Secondary | ICD-10-CM | POA: Diagnosis not present

## 2015-09-23 DIAGNOSIS — Z1239 Encounter for other screening for malignant neoplasm of breast: Secondary | ICD-10-CM | POA: Diagnosis not present

## 2015-09-23 DIAGNOSIS — N62 Hypertrophy of breast: Secondary | ICD-10-CM | POA: Diagnosis not present

## 2015-10-05 MED FILL — ALLOPURINOL 100 MG TABLET: 100 | 30 days supply | Qty: 30 | Fill #1

## 2015-10-06 MED FILL — IBUPROFEN 800 MG TABLET: 800 | 30 days supply | Qty: 60 | Fill #0

## 2015-10-15 DIAGNOSIS — E669 Obesity, unspecified: Secondary | ICD-10-CM | POA: Diagnosis not present

## 2015-10-15 DIAGNOSIS — I83813 Varicose veins of bilateral lower extremities with pain: Secondary | ICD-10-CM | POA: Diagnosis not present

## 2015-10-15 DIAGNOSIS — M609 Myositis, unspecified: Secondary | ICD-10-CM | POA: Diagnosis not present

## 2015-10-15 DIAGNOSIS — R7301 Impaired fasting glucose: Secondary | ICD-10-CM | POA: Diagnosis not present

## 2015-11-01 MED FILL — VIT D2 1.25 MG (50,000 UNIT: 1.25 MG | 28 days supply | Qty: 8 | Fill #0

## 2015-11-16 MED FILL — ALLOPURINOL 100 MG TABLET: 100 | 30 days supply | Qty: 30 | Fill #2

## 2015-11-24 MED FILL — IBUPROFEN 800 MG TABLET: 800 | 30 days supply | Qty: 60 | Fill #1

## 2015-11-24 MED FILL — VIT D2 1.25 MG (50,000 UNIT: 1.25 MG | 28 days supply | Qty: 8 | Fill #1

## 2015-11-24 MED FILL — METOPROLOL SUCC ER 25 MG TA: 25 | 90 days supply | Qty: 90 | Fill #1

## 2015-11-24 MED FILL — FOLIC ACID 1 MG TABLET: 1 | 90 days supply | Qty: 90 | Fill #1

## 2015-11-29 DIAGNOSIS — M609 Myositis, unspecified: Secondary | ICD-10-CM | POA: Diagnosis not present

## 2015-11-29 DIAGNOSIS — R200 Anesthesia of skin: Secondary | ICD-10-CM | POA: Diagnosis not present

## 2015-11-29 DIAGNOSIS — R7301 Impaired fasting glucose: Secondary | ICD-10-CM | POA: Diagnosis not present

## 2015-11-29 DIAGNOSIS — M10072 Idiopathic gout, left ankle and foot: Secondary | ICD-10-CM | POA: Diagnosis not present

## 2015-11-29 DIAGNOSIS — E669 Obesity, unspecified: Secondary | ICD-10-CM | POA: Diagnosis not present

## 2015-12-10 MED FILL — FLUCONAZOLE 150 MG TABLET: 150 | 7 days supply | Qty: 2 | Fill #0

## 2015-12-13 ENCOUNTER — Other Ambulatory Visit: Payer: Self-pay | Admitting: *Deleted

## 2015-12-13 DIAGNOSIS — R202 Paresthesia of skin: Secondary | ICD-10-CM

## 2015-12-15 MED FILL — ALLOPURINOL 100 MG TABLET: 100 | 30 days supply | Qty: 30 | Fill #3

## 2015-12-15 MED FILL — VIT D2 1.25 MG (50,000 UNIT: 1.25 MG | 28 days supply | Qty: 8 | Fill #2

## 2015-12-16 ENCOUNTER — Encounter: Payer: 59 | Admitting: Neurology

## 2015-12-16 MED FILL — OMEPRAZOLE DR 20 MG CAPSULE: 20 | 90 days supply | Qty: 180 | Fill #0

## 2015-12-17 MED FILL — CYCLOBENZAPRINE 5 MG TABLET: 5 | 10 days supply | Qty: 30 | Fill #1

## 2016-01-06 ENCOUNTER — Encounter: Payer: 59 | Admitting: Neurology

## 2016-01-06 ENCOUNTER — Ambulatory Visit (INDEPENDENT_AMBULATORY_CARE_PROVIDER_SITE_OTHER): Payer: 59 | Admitting: Neurology

## 2016-01-06 DIAGNOSIS — G5603 Carpal tunnel syndrome, bilateral upper limbs: Secondary | ICD-10-CM

## 2016-01-06 DIAGNOSIS — R202 Paresthesia of skin: Secondary | ICD-10-CM

## 2016-01-06 NOTE — Procedures (Signed)
[facility]  [address], [address]  [place], [place] [address] Tel: [phone] Fax:  [phone]) [phone] Test Date:  01/06/2016  Patient: Laurie BelfastRhonda Bellanca DOB: 10/05/1965 Physician: Nita Sickleonika Patel, DO  Sex: Female Height: 5\' 7"  Ref Phys: Fleet ContrasEdwin Avbuere, M.D.  ID#: [id number] Temp: 33.2C Technician: Judie PetitM. Dean   Patient Complaints: This is a 50 year old female referred for evaluation of 6 month history of bilateral hand paresthesias and pain.   NCV & EMG Findings: Extensive electrodiagnostic testing of the left upper extremity and additional studies of the right shows:  1. The left median sensory and the right median sensory nerves showed prolonged distal peak latency (L5.7, R4.3 ms) and reduced amplitude (L11.1, R16.6 V).  Bilateral ulnar sensory responses are within normal limits. 2. Left median motor and the right median motor nerves showed prolonged distal onset latency (L5.1, R4.7 ms). 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 3. There is no evidence of active or chronic motor axon loss changes affecting any of the tested muscles. Motor unit configuration and recruitment pattern is within normal limits.  Impression: Bilateral median neuropathy at or distal to the wrist, consistent with the clinical diagnosis of carpal tunnel syndrome. Overall, these findings are moderate-to-severe in degree electrically and worse on the left.   ___________________________ Nita Sickleonika Patel, DO    Nerve Conduction Studies Anti Sensory Summary Table   Site NR Peak (ms) Norm Peak (ms) P-T Amp (V) Norm P-T Amp  Left Median Anti Sensory (2nd Digit)  Wrist    5.7 <3.4 11.1 >20  Right Median Anti Sensory (2nd Digit)  Wrist    4.3 <3.4 16.6 >20  Left Ulnar Anti Sensory (5th Digit)  Wrist    2.9 <3.1 24.3 >12  Right Ulnar Anti Sensory (5th Digit)    Wrist    2.8 <3.1 23.2 >12   Motor Summary Table   Site NR Onset (ms) Norm Onset (ms) O-P Amp (mV) Norm O-P Amp Site1 Site2 Delta-0 (ms) Dist (cm) Vel (m/s) Norm Vel (m/s)  Left Median Motor (Abd Poll Brev)  Wrist    5.1 <3.9 8.7 >6 Elbow Wrist 3.0 24.0 80 >50  Elbow    8.1  9.5  Axilla Elbow 4.0 0.0    Axilla    4.1  6.6         Right Median Motor (Abd Poll Brev)  Wrist    4.7 <3.9 6.9 >6 Elbow Wrist 3.1 21.0 68 >50  Elbow    7.8  6.0  Axilla Elbow 3.4 0.0    Axilla    4.4  7.3         Left Ulnar Motor (Abd Dig Minimi)  Wrist    2.6 <3.1 8.3 >7 B Elbow Wrist 4.0 23.0 58 >50  B Elbow    6.6  7.1  A Elbow B Elbow 1.7 10.0 59 >50  A Elbow    8.3  6.7         Right Ulnar Motor (Abd Dig Minimi)  Wrist    2.3 <3.1 10.4 >7 B Elbow Wrist 4.1 24.0 59 >50  B Elbow    6.4  8.0  A Elbow B Elbow 1.7 10.0 59 >50  A Elbow    8.1  7.2          EMG   Side Muscle Ins Act Fibs Psw Fasc Number Recrt  Dur Dur. Amp Amp. Poly Poly. Comment  Left 1stDorInt Nml Nml Nml Nml Nml Nml Nml Nml Nml Nml Nml Nml N/A  Left Abd Poll Brev Nml Nml Nml Nml Nml Nml Nml Nml Nml Nml Nml Nml N/A  Left Ext Indicis Nml Nml Nml Nml Nml Nml Nml Nml Nml Nml Nml Nml N/A  Left PronatorTeres Nml Nml Nml Nml Nml Nml Nml Nml Nml Nml Nml Nml N/A  Left Biceps Nml Nml Nml Nml Nml Nml Nml Nml Nml Nml Nml Nml N/A  Left Triceps Nml Nml Nml Nml Nml Nml Nml Nml Nml Nml Nml Nml N/A  Left Deltoid Nml Nml Nml Nml Nml Nml Nml Nml Nml Nml Nml Nml N/A  Right 1stDorInt Nml Nml Nml Nml Nml Nml Nml Nml Nml Nml Nml Nml N/A  Right Abd Poll Brev Nml Nml Nml Nml Nml Nml Nml Nml Nml Nml Nml Nml N/A  Right PronatorTeres Nml Nml Nml Nml Nml Nml Nml Nml Nml Nml Nml Nml N/A      Waveforms:

## 2016-01-12 MED FILL — ALLOPURINOL 100 MG TABLET: 100 | 30 days supply | Qty: 30 | Fill #4

## 2016-01-13 DIAGNOSIS — M609 Myositis, unspecified: Secondary | ICD-10-CM | POA: Diagnosis not present

## 2016-01-13 DIAGNOSIS — M10072 Idiopathic gout, left ankle and foot: Secondary | ICD-10-CM | POA: Diagnosis not present

## 2016-01-13 DIAGNOSIS — E669 Obesity, unspecified: Secondary | ICD-10-CM | POA: Diagnosis not present

## 2016-01-13 DIAGNOSIS — E8779 Other fluid overload: Secondary | ICD-10-CM | POA: Diagnosis not present

## 2016-01-13 DIAGNOSIS — R7301 Impaired fasting glucose: Secondary | ICD-10-CM | POA: Diagnosis not present

## 2016-01-13 DIAGNOSIS — R200 Anesthesia of skin: Secondary | ICD-10-CM | POA: Diagnosis not present

## 2016-01-13 MED FILL — FUROSEMIDE 20 MG TABLET: 20 | 30 days supply | Qty: 30 | Fill #0

## 2016-01-21 MED FILL — PHENTERMINE 37.5 MG TABLET: 37.5 | 30 days supply | Qty: 30 | Fill #0

## 2016-01-26 MED FILL — VIT D2 1.25 MG (50,000 UNIT: 1.25 MG | 84 days supply | Qty: 12 | Fill #1

## 2016-02-02 MED FILL — VIT D2 1.25 MG (50,000 UNIT: 1.25 MG | 28 days supply | Qty: 8 | Fill #0

## 2016-02-08 MED FILL — AZITHROMYCIN 250 MG TABLET: 250 | 5 days supply | Qty: 6 | Fill #0

## 2016-02-10 MED FILL — ALLOPURINOL 100 MG TABLET: 100 | 30 days supply | Qty: 30 | Fill #5

## 2016-02-21 ENCOUNTER — Telehealth: Payer: Self-pay | Admitting: Cardiology

## 2016-02-21 NOTE — Telephone Encounter (Signed)
Chart reviewed and appears this was discussed at last visit. Routed to provider to address in letter.

## 2016-02-21 NOTE — Telephone Encounter (Signed)
Pt says she needs a letter stating that Dr Herbie BaltimoreHarding said a breast reduction would help with her shortness of breath please.

## 2016-03-01 MED FILL — METOPROLOL SUCC ER 25 MG TA: 25 | 90 days supply | Qty: 90 | Fill #2

## 2016-03-01 MED FILL — FOLIC ACID 1 MG TABLET: 1 | 90 days supply | Qty: 90 | Fill #2

## 2016-03-01 MED FILL — PHENTERMINE 37.5 MG TABLET: 37.5 | 30 days supply | Qty: 30 | Fill #0

## 2016-03-01 NOTE — Telephone Encounter (Signed)
Follow UP      Mrs. Laurie [name] is calling about a letter that she is needing stating that a breast reduction will reduce some of her SOB . Please call

## 2016-03-01 NOTE — Telephone Encounter (Signed)
Spoke to patient She states she has not decided on surgeon at present - she would like the letter so she will have it for surgeon and insurance company.  patient states she is always try to lose weight and exercise.  she states she lost @4 - 5 lbs since office visit.   Patient aware will have to defer to Dr Herbie BaltimoreHarding for letter if needed. Aware not in office for couple weeks, will contacted patient when available

## 2016-03-02 MED FILL — VIT D2 1.25 MG (50,000 UNIT: 1.25 MG | 28 days supply | Qty: 8 | Fill #1

## 2016-03-10 MED FILL — ALLOPURINOL 100 MG TABLET: 100 | 30 days supply | Qty: 30 | Fill #0

## 2016-03-17 NOTE — Telephone Encounter (Signed)
Follow Up    Laurie DecemberSharon please call her regarding the Breast Reduction , have you gotten the letter yet

## 2016-03-20 NOTE — Telephone Encounter (Signed)
Spoke to patient . Informed  May have copy of last office  note , in the place of letter. Patient voiced that she will pick up the copy tomorrow from the front desk.

## 2016-03-20 NOTE — Telephone Encounter (Signed)
Left message to call back. Per Dr Herbie BaltimoreHarding, may give patient a copy of last office

## 2016-03-23 DIAGNOSIS — N62 Hypertrophy of breast: Secondary | ICD-10-CM | POA: Diagnosis not present

## 2016-03-23 DIAGNOSIS — E669 Obesity, unspecified: Secondary | ICD-10-CM | POA: Diagnosis not present

## 2016-03-23 DIAGNOSIS — M609 Myositis, unspecified: Secondary | ICD-10-CM | POA: Diagnosis not present

## 2016-03-23 DIAGNOSIS — R000 Tachycardia, unspecified: Secondary | ICD-10-CM | POA: Diagnosis not present

## 2016-03-23 DIAGNOSIS — R7309 Other abnormal glucose: Secondary | ICD-10-CM | POA: Diagnosis not present

## 2016-04-05 NOTE — Telephone Encounter (Signed)
I think my last note would serve well.  Bryan Lemmaavid Harding, MD

## 2016-04-12 DIAGNOSIS — N62 Hypertrophy of breast: Secondary | ICD-10-CM | POA: Diagnosis not present

## 2016-04-12 MED FILL — IBUPROFEN 800 MG TABLET: 800 | 30 days supply | Qty: 60 | Fill #0

## 2016-04-12 MED FILL — ALLOPURINOL 100 MG TABLET: 100 | 30 days supply | Qty: 30 | Fill #1

## 2016-04-25 ENCOUNTER — Other Ambulatory Visit: Payer: Self-pay | Admitting: Internal Medicine

## 2016-04-26 ENCOUNTER — Other Ambulatory Visit: Payer: Self-pay | Admitting: Internal Medicine

## 2016-04-26 DIAGNOSIS — N644 Mastodynia: Secondary | ICD-10-CM

## 2016-05-01 ENCOUNTER — Other Ambulatory Visit: Payer: 59

## 2016-05-01 MED FILL — CYCLOBENZAPRINE 5 MG TABLET: 5 | 10 days supply | Qty: 30 | Fill #2

## 2016-05-03 ENCOUNTER — Ambulatory Visit
Admission: RE | Admit: 2016-05-03 | Discharge: 2016-05-03 | Disposition: A | Discharge status: Home / Self Care | Payer: 59 | Source: Ambulatory Visit | Attending: Internal Medicine | Admitting: Internal Medicine

## 2016-05-03 DIAGNOSIS — N644 Mastodynia: Secondary | ICD-10-CM | POA: Diagnosis not present

## 2016-05-04 DIAGNOSIS — K219 Gastro-esophageal reflux disease without esophagitis: Secondary | ICD-10-CM | POA: Diagnosis not present

## 2016-05-04 DIAGNOSIS — E669 Obesity, unspecified: Secondary | ICD-10-CM | POA: Diagnosis not present

## 2016-05-04 DIAGNOSIS — E2839 Other primary ovarian failure: Secondary | ICD-10-CM | POA: Diagnosis not present

## 2016-05-04 DIAGNOSIS — R7301 Impaired fasting glucose: Secondary | ICD-10-CM | POA: Diagnosis not present

## 2016-05-04 DIAGNOSIS — M609 Myositis, unspecified: Secondary | ICD-10-CM | POA: Diagnosis not present

## 2016-05-04 MED FILL — VIT D2 1.25 MG (50,000 UNIT: 1.25 MG | 28 days supply | Qty: 8 | Fill #0

## 2016-05-04 MED FILL — OMEPRAZOLE DR 20 MG CAPSULE: 20 | 30 days supply | Qty: 60 | Fill #0

## 2016-05-05 ENCOUNTER — Other Ambulatory Visit: Payer: Self-pay | Admitting: Internal Medicine

## 2016-05-05 DIAGNOSIS — E2839 Other primary ovarian failure: Secondary | ICD-10-CM

## 2016-05-11 MED FILL — ALLOPURINOL 100 MG TABLET: 100 | 30 days supply | Qty: 30 | Fill #2

## 2016-05-12 MED FILL — PHENTERMINE 37.5 MG TABLET: 37.5 | 30 days supply | Qty: 30 | Fill #0

## 2016-05-19 ENCOUNTER — Other Ambulatory Visit: Payer: 59

## 2016-05-31 MED FILL — METOPROLOL SUCC ER 25 MG TA: 25 | 90 days supply | Qty: 90 | Fill #3

## 2016-05-31 MED FILL — FOLIC ACID 1 MG TABLET: 1 | 90 days supply | Qty: 90 | Fill #3

## 2016-06-06 MED FILL — ALLOPURINOL 100 MG TABLET: 100 | 30 days supply | Qty: 30 | Fill #0

## 2016-06-09 ENCOUNTER — Inpatient Hospital Stay
Admission: RE | Admit: 2016-06-09 | Discharge: 2016-06-09 | Disposition: A | Discharge status: Home / Self Care | Payer: 59 | Source: Ambulatory Visit | Attending: Internal Medicine | Admitting: Internal Medicine

## 2016-06-23 DIAGNOSIS — M609 Myositis, unspecified: Secondary | ICD-10-CM | POA: Diagnosis not present

## 2016-06-23 DIAGNOSIS — R7301 Impaired fasting glucose: Secondary | ICD-10-CM | POA: Diagnosis not present

## 2016-06-23 DIAGNOSIS — E669 Obesity, unspecified: Secondary | ICD-10-CM | POA: Diagnosis not present

## 2016-06-23 DIAGNOSIS — K219 Gastro-esophageal reflux disease without esophagitis: Secondary | ICD-10-CM | POA: Diagnosis not present

## 2016-07-05 DIAGNOSIS — E669 Obesity, unspecified: Secondary | ICD-10-CM | POA: Diagnosis not present

## 2016-07-05 DIAGNOSIS — R7301 Impaired fasting glucose: Secondary | ICD-10-CM | POA: Diagnosis not present

## 2016-07-05 DIAGNOSIS — M609 Myositis, unspecified: Secondary | ICD-10-CM | POA: Diagnosis not present

## 2016-07-11 MED FILL — traMADol HCL 50 MG TABS: 50 | 30 days supply | Qty: 60 | Fill #0

## 2016-07-11 MED FILL — ALLOPURINOL 100 MG TABLET: 100 | 30 days supply | Qty: 30 | Fill #1

## 2016-07-12 ENCOUNTER — Telehealth: Payer: Self-pay | Admitting: Radiology

## 2016-07-12 NOTE — Telephone Encounter (Signed)
Received referral today for Fibromyalgia/ from [facility]. Dr Corliss Skainseveshwar does not treat Fibromyalgia I have returned fax stating this.

## 2016-07-21 MED FILL — VIT D2 1.25 MG (50,000 UNIT: 1.25 MG | 28 days supply | Qty: 8 | Fill #2

## 2016-08-09 DIAGNOSIS — R7301 Impaired fasting glucose: Secondary | ICD-10-CM | POA: Diagnosis not present

## 2016-08-09 DIAGNOSIS — N62 Hypertrophy of breast: Secondary | ICD-10-CM | POA: Diagnosis not present

## 2016-08-09 DIAGNOSIS — M179 Osteoarthritis of knee, unspecified: Secondary | ICD-10-CM | POA: Diagnosis not present

## 2016-08-09 DIAGNOSIS — M609 Myositis, unspecified: Secondary | ICD-10-CM | POA: Diagnosis not present

## 2016-08-09 DIAGNOSIS — M10072 Idiopathic gout, left ankle and foot: Secondary | ICD-10-CM | POA: Diagnosis not present

## 2016-08-09 MED FILL — ALLOPURINOL 100 MG TABLET: 100 | 30 days supply | Qty: 30 | Fill #2

## 2016-08-16 DIAGNOSIS — M5386 Other specified dorsopathies, lumbar region: Secondary | ICD-10-CM | POA: Diagnosis not present

## 2016-08-16 DIAGNOSIS — M9903 Segmental and somatic dysfunction of lumbar region: Secondary | ICD-10-CM | POA: Diagnosis not present

## 2016-08-16 DIAGNOSIS — M5432 Sciatica, left side: Secondary | ICD-10-CM | POA: Diagnosis not present

## 2016-08-16 DIAGNOSIS — M9902 Segmental and somatic dysfunction of thoracic region: Secondary | ICD-10-CM | POA: Diagnosis not present

## 2016-08-16 DIAGNOSIS — M9905 Segmental and somatic dysfunction of pelvic region: Secondary | ICD-10-CM | POA: Diagnosis not present

## 2016-08-17 DIAGNOSIS — M9902 Segmental and somatic dysfunction of thoracic region: Secondary | ICD-10-CM | POA: Diagnosis not present

## 2016-08-17 DIAGNOSIS — M9905 Segmental and somatic dysfunction of pelvic region: Secondary | ICD-10-CM | POA: Diagnosis not present

## 2016-08-17 DIAGNOSIS — M5432 Sciatica, left side: Secondary | ICD-10-CM | POA: Diagnosis not present

## 2016-08-17 DIAGNOSIS — M9903 Segmental and somatic dysfunction of lumbar region: Secondary | ICD-10-CM | POA: Diagnosis not present

## 2016-08-17 DIAGNOSIS — M5386 Other specified dorsopathies, lumbar region: Secondary | ICD-10-CM | POA: Diagnosis not present

## 2016-08-21 DIAGNOSIS — M5386 Other specified dorsopathies, lumbar region: Secondary | ICD-10-CM | POA: Diagnosis not present

## 2016-08-21 DIAGNOSIS — M9903 Segmental and somatic dysfunction of lumbar region: Secondary | ICD-10-CM | POA: Diagnosis not present

## 2016-08-21 DIAGNOSIS — M5432 Sciatica, left side: Secondary | ICD-10-CM | POA: Diagnosis not present

## 2016-08-21 DIAGNOSIS — M9902 Segmental and somatic dysfunction of thoracic region: Secondary | ICD-10-CM | POA: Diagnosis not present

## 2016-08-21 DIAGNOSIS — M9905 Segmental and somatic dysfunction of pelvic region: Secondary | ICD-10-CM | POA: Diagnosis not present

## 2016-08-23 DIAGNOSIS — M5432 Sciatica, left side: Secondary | ICD-10-CM | POA: Diagnosis not present

## 2016-08-23 DIAGNOSIS — M9905 Segmental and somatic dysfunction of pelvic region: Secondary | ICD-10-CM | POA: Diagnosis not present

## 2016-08-23 DIAGNOSIS — M9903 Segmental and somatic dysfunction of lumbar region: Secondary | ICD-10-CM | POA: Diagnosis not present

## 2016-08-23 DIAGNOSIS — M9902 Segmental and somatic dysfunction of thoracic region: Secondary | ICD-10-CM | POA: Diagnosis not present

## 2016-08-23 DIAGNOSIS — M5386 Other specified dorsopathies, lumbar region: Secondary | ICD-10-CM | POA: Diagnosis not present

## 2016-08-23 MED FILL — IBUPROFEN 800 MG TAB: 800 | 30 days supply | Qty: 60 | Fill #2

## 2016-08-23 MED FILL — METOPROLOL SUCC ER 25 MG TA: 25 | 30 days supply | Qty: 30 | Fill #0

## 2016-08-23 MED FILL — VIT D2 1.25 MG (50,000 UNIT: 1.25 MG | 28 days supply | Qty: 8 | Fill #1

## 2016-08-24 DIAGNOSIS — M9903 Segmental and somatic dysfunction of lumbar region: Secondary | ICD-10-CM | POA: Diagnosis not present

## 2016-08-24 DIAGNOSIS — M5386 Other specified dorsopathies, lumbar region: Secondary | ICD-10-CM | POA: Diagnosis not present

## 2016-08-24 DIAGNOSIS — M5432 Sciatica, left side: Secondary | ICD-10-CM | POA: Diagnosis not present

## 2016-08-24 DIAGNOSIS — M9905 Segmental and somatic dysfunction of pelvic region: Secondary | ICD-10-CM | POA: Diagnosis not present

## 2016-08-24 DIAGNOSIS — M9902 Segmental and somatic dysfunction of thoracic region: Secondary | ICD-10-CM | POA: Diagnosis not present

## 2016-08-24 MED FILL — FOLIC ACID 1 MG TABLET: 1 | 90 days supply | Qty: 90 | Fill #0

## 2016-08-28 DIAGNOSIS — M5386 Other specified dorsopathies, lumbar region: Secondary | ICD-10-CM | POA: Diagnosis not present

## 2016-08-28 DIAGNOSIS — M5432 Sciatica, left side: Secondary | ICD-10-CM | POA: Diagnosis not present

## 2016-08-28 DIAGNOSIS — M9903 Segmental and somatic dysfunction of lumbar region: Secondary | ICD-10-CM | POA: Diagnosis not present

## 2016-08-28 DIAGNOSIS — M9905 Segmental and somatic dysfunction of pelvic region: Secondary | ICD-10-CM | POA: Diagnosis not present

## 2016-08-28 DIAGNOSIS — M9902 Segmental and somatic dysfunction of thoracic region: Secondary | ICD-10-CM | POA: Diagnosis not present

## 2016-08-30 DIAGNOSIS — Z471 Aftercare following joint replacement surgery: Secondary | ICD-10-CM | POA: Diagnosis not present

## 2016-08-30 DIAGNOSIS — M1612 Unilateral primary osteoarthritis, left hip: Secondary | ICD-10-CM | POA: Diagnosis not present

## 2016-08-30 DIAGNOSIS — M25552 Pain in left hip: Secondary | ICD-10-CM | POA: Diagnosis not present

## 2016-08-30 DIAGNOSIS — Z96641 Presence of right artificial hip joint: Secondary | ICD-10-CM | POA: Diagnosis not present

## 2016-09-07 DIAGNOSIS — M5386 Other specified dorsopathies, lumbar region: Secondary | ICD-10-CM | POA: Diagnosis not present

## 2016-09-07 DIAGNOSIS — M9905 Segmental and somatic dysfunction of pelvic region: Secondary | ICD-10-CM | POA: Diagnosis not present

## 2016-09-07 DIAGNOSIS — R768 Other specified abnormal immunological findings in serum: Secondary | ICD-10-CM | POA: Insufficient documentation

## 2016-09-07 DIAGNOSIS — M2550 Pain in unspecified joint: Secondary | ICD-10-CM | POA: Insufficient documentation

## 2016-09-07 DIAGNOSIS — M109 Gout, unspecified: Secondary | ICD-10-CM | POA: Insufficient documentation

## 2016-09-07 DIAGNOSIS — G4709 Other insomnia: Secondary | ICD-10-CM | POA: Insufficient documentation

## 2016-09-07 DIAGNOSIS — M5432 Sciatica, left side: Secondary | ICD-10-CM | POA: Diagnosis not present

## 2016-09-07 DIAGNOSIS — R5383 Other fatigue: Secondary | ICD-10-CM | POA: Insufficient documentation

## 2016-09-07 DIAGNOSIS — M9902 Segmental and somatic dysfunction of thoracic region: Secondary | ICD-10-CM | POA: Diagnosis not present

## 2016-09-07 DIAGNOSIS — M9903 Segmental and somatic dysfunction of lumbar region: Secondary | ICD-10-CM | POA: Diagnosis not present

## 2016-09-07 NOTE — Progress Notes (Deleted)
Office Visit Note  Patient: Laurie [name]             Date of Birth: 02/17/1965           MRN: [id number]             PCP: Fleet ContrasAvbuere, Edwin, MD Referring: Fleet ContrasAvbuere, Edwin, MD Visit Date: 09/14/2016 Occupation: @GUAROCC @    Subjective:  No chief complaint on file.   History of Present Illness: Laurie [name] is a 51 y.o. female ***   Activities of Daily Living:  Patient reports morning stiffness for *** {minute/hour:19697}.   Patient {ACTIONS;DENIES/REPORTS:21021675::"Denies"} nocturnal pain.  Difficulty dressing/grooming: {ACTIONS;DENIES/REPORTS:21021675::"Denies"} Difficulty climbing stairs: {ACTIONS;DENIES/REPORTS:21021675::"Denies"} Difficulty getting out of chair: {ACTIONS;DENIES/REPORTS:21021675::"Denies"} Difficulty using hands for taps, buttons, cutlery, and/or writing: {ACTIONS;DENIES/REPORTS:21021675::"Denies"}   No Rheumatology ROS completed.   PMFS History:  Patient Active Problem List   Diagnosis Date Noted  . Polyarthralgia 09/07/2016  . Other fatigue 09/07/2016  . Other insomnia 09/07/2016  . Gout of left foot 09/07/2016  . ANA positive 09/07/2016  . Chest pain with minimal risk for cardiac etiology 08/28/2015  . Intermittent palpitations 08/28/2015  . Allergic rhinitis   . Endometriosis   . Arthritis of right hip   . Fluid overload   . Fibromyalgia   . Sinus tachycardia   . Obesity, unspecified   . Eczematous dermatitis   . Paresthesias/numbness   . Esophageal reflux   . Edema of both legs   . Shortness of breath 06/21/2015  . Varicose veins of bilateral lower extremities with other complications 04/26/2015  . Spider veins of both lower extremities 04/12/2015  . Gynecomastia 11/18/2013  . Premature menopause 07/09/2013  . Gout 02/08/2012  . Menopause 11/14/2011    Past Medical History:  Diagnosis Date  . Acne vulgaris 01/19/15  . Allergic rhinitis   . Anxiety 02/18/15  . Arthritis of knee   . Arthritis of right hip   . Borderline  diabetes 11/14/11  . Eczematous dermatitis 11/08/10  . Edema of both legs   . Endometriosis   . Esophageal reflux   . Fibromyalgia   . Gout 02/08/12   idopathic gout, left ankle and foot  . Gynecomastia 11/18/13   hypertrophy of breast  . Menopause 11/14/11  . Obesity, unspecified 07/04/05  . Paresthesias/numbness 11/14/11  . Premature menopause 07/09/13  . Sinus tachycardia   . Spider veins of both lower extremities 04/12/15  . Varicose veins   . Varicose veins of bilateral lower extremities with other complications 04/26/15    Family History  Problem Relation Age of Onset  . Healthy Father   . Cancer Mother   . Heart failure Maternal Aunt   . Breast cancer Maternal Aunt    Past Surgical History:  Procedure Laterality Date  . Cardiac Event Monitor  03/2015   [facility]: Sinus rhythm with occasional sinus tachycardia. Rates as high as 122. No arrhythmias seen.  Marland Kitchen. TOTAL HIP ARTHROPLASTY  05/23/2006  . TRANSTHORACIC ECHOCARDIOGRAM  03/02/2015   [facility], Dr. Sherril CroonVyas: Normal LV size, mild-moderate concentric LVH with mild diffuse HK. EF~50%. Mild LA dilation. Could not exclude small ASD/PFO due to atrial septal dropout. Mild TR, mild MR.   Social History   Social History Narrative   Married now for 3 years. Mother of one.   This with her spouse. 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 Does not exercise.  Objective: Vital Signs: There were no vitals taken for this visit.   Physical Exam   Musculoskeletal Exam: ***  CDAI Exam: No CDAI exam completed.    Investigation: Findings:  06/23/2016 Uric Acid 6.5,Sed Rate 55, CBC normal, CMP normal, ANA positive 1:80 titer NO, TSH normal, Vitamin B 12 normal, and Vitamin D normal 46    Imaging: No results found.  Speciality Comments: No specialty comments available.    Procedures:  No procedures performed Allergies:  Sulfa antibiotics   Assessment / Plan:     Visit Diagnoses: Polyarthralgia  ANA positive - 1:80 NO   History of fibromyalgia  Other fatigue  Other insomnia  Other eczema  Edema of both legs  Idiopathic chronic gout of left foot without tophus  History of gastroesophageal reflux (GERD)  History of prediabetes    Orders: No orders of the defined types were placed in this encounter.  No orders of the defined types were placed in this encounter.   Face-to-face time spent with patient was *** minutes. 50% of time was spent in counseling and coordination of care.  Follow-Up Instructions: No Follow-up on file.   Pollyann Savoy, MD  Note - This record has been created using Animal nutritionist.  Chart creation errors have been sought, but may not always  have been located. Such creation errors do not reflect on  the standard of medical care.

## 2016-09-08 MED FILL — ALLOPURINOL 100 MG TABLET: 100 | 30 days supply | Qty: 30 | Fill #3

## 2016-09-11 DIAGNOSIS — M5432 Sciatica, left side: Secondary | ICD-10-CM | POA: Diagnosis not present

## 2016-09-11 DIAGNOSIS — M9902 Segmental and somatic dysfunction of thoracic region: Secondary | ICD-10-CM | POA: Diagnosis not present

## 2016-09-11 DIAGNOSIS — M9903 Segmental and somatic dysfunction of lumbar region: Secondary | ICD-10-CM | POA: Diagnosis not present

## 2016-09-11 DIAGNOSIS — M5386 Other specified dorsopathies, lumbar region: Secondary | ICD-10-CM | POA: Diagnosis not present

## 2016-09-11 DIAGNOSIS — M9905 Segmental and somatic dysfunction of pelvic region: Secondary | ICD-10-CM | POA: Diagnosis not present

## 2016-09-14 ENCOUNTER — Ambulatory Visit: Payer: Self-pay | Admitting: Rheumatology

## 2016-09-21 DIAGNOSIS — M5432 Sciatica, left side: Secondary | ICD-10-CM | POA: Diagnosis not present

## 2016-09-21 DIAGNOSIS — M9902 Segmental and somatic dysfunction of thoracic region: Secondary | ICD-10-CM | POA: Diagnosis not present

## 2016-09-21 DIAGNOSIS — M9903 Segmental and somatic dysfunction of lumbar region: Secondary | ICD-10-CM | POA: Diagnosis not present

## 2016-09-21 DIAGNOSIS — M9905 Segmental and somatic dysfunction of pelvic region: Secondary | ICD-10-CM | POA: Diagnosis not present

## 2016-09-21 DIAGNOSIS — M5386 Other specified dorsopathies, lumbar region: Secondary | ICD-10-CM | POA: Diagnosis not present

## 2016-09-25 MED FILL — METOPROLOL SUCC ER 25 MG TA: 25 | 30 days supply | Qty: 30 | Fill #1

## 2016-09-27 DIAGNOSIS — M5432 Sciatica, left side: Secondary | ICD-10-CM | POA: Diagnosis not present

## 2016-09-27 DIAGNOSIS — M9905 Segmental and somatic dysfunction of pelvic region: Secondary | ICD-10-CM | POA: Diagnosis not present

## 2016-09-27 DIAGNOSIS — M5386 Other specified dorsopathies, lumbar region: Secondary | ICD-10-CM | POA: Diagnosis not present

## 2016-09-27 DIAGNOSIS — M9903 Segmental and somatic dysfunction of lumbar region: Secondary | ICD-10-CM | POA: Diagnosis not present

## 2016-09-27 DIAGNOSIS — M9902 Segmental and somatic dysfunction of thoracic region: Secondary | ICD-10-CM | POA: Diagnosis not present

## 2016-10-03 MED FILL — VIT D2 1.25 MG (50,000 UNIT: 1.25 MG | 28 days supply | Qty: 8 | Fill #2

## 2016-10-04 DIAGNOSIS — M9903 Segmental and somatic dysfunction of lumbar region: Secondary | ICD-10-CM | POA: Diagnosis not present

## 2016-10-04 DIAGNOSIS — M9902 Segmental and somatic dysfunction of thoracic region: Secondary | ICD-10-CM | POA: Diagnosis not present

## 2016-10-04 DIAGNOSIS — M5432 Sciatica, left side: Secondary | ICD-10-CM | POA: Diagnosis not present

## 2016-10-04 DIAGNOSIS — M9905 Segmental and somatic dysfunction of pelvic region: Secondary | ICD-10-CM | POA: Diagnosis not present

## 2016-10-04 DIAGNOSIS — M5386 Other specified dorsopathies, lumbar region: Secondary | ICD-10-CM | POA: Diagnosis not present

## 2016-10-10 DIAGNOSIS — M179 Osteoarthritis of knee, unspecified: Secondary | ICD-10-CM | POA: Diagnosis not present

## 2016-10-10 DIAGNOSIS — M10072 Idiopathic gout, left ankle and foot: Secondary | ICD-10-CM | POA: Diagnosis not present

## 2016-10-10 DIAGNOSIS — R7301 Impaired fasting glucose: Secondary | ICD-10-CM | POA: Diagnosis not present

## 2016-10-10 DIAGNOSIS — Z6836 Body mass index (BMI) 36.0-36.9, adult: Secondary | ICD-10-CM | POA: Diagnosis not present

## 2016-10-10 DIAGNOSIS — M609 Myositis, unspecified: Secondary | ICD-10-CM | POA: Diagnosis not present

## 2016-10-10 DIAGNOSIS — R000 Tachycardia, unspecified: Secondary | ICD-10-CM | POA: Diagnosis not present

## 2016-10-10 DIAGNOSIS — E669 Obesity, unspecified: Secondary | ICD-10-CM | POA: Diagnosis not present

## 2016-10-10 DIAGNOSIS — K5904 Chronic idiopathic constipation: Secondary | ICD-10-CM | POA: Diagnosis not present

## 2016-10-10 DIAGNOSIS — N3001 Acute cystitis with hematuria: Secondary | ICD-10-CM | POA: Diagnosis not present

## 2016-10-10 DIAGNOSIS — M12851 Other specific arthropathies, not elsewhere classified, right hip: Secondary | ICD-10-CM | POA: Diagnosis not present

## 2016-10-10 MED FILL — FLUCONAZOLE 150 MG TABLET: 150 | 8 days supply | Qty: 2 | Fill #0

## 2016-10-10 MED FILL — SULFAMETHOXAZOLE/TMP DS TAB: 800-160 | 10 days supply | Qty: 20 | Fill #0

## 2016-10-10 MED FILL — ALLOPURINOL 100 MG TABLET: 100 | 30 days supply | Qty: 30 | Fill #4

## 2016-10-18 ENCOUNTER — Ambulatory Visit: Payer: Self-pay | Admitting: Rheumatology

## 2016-10-18 DIAGNOSIS — M5386 Other specified dorsopathies, lumbar region: Secondary | ICD-10-CM | POA: Diagnosis not present

## 2016-10-18 DIAGNOSIS — M9902 Segmental and somatic dysfunction of thoracic region: Secondary | ICD-10-CM | POA: Diagnosis not present

## 2016-10-18 DIAGNOSIS — M9905 Segmental and somatic dysfunction of pelvic region: Secondary | ICD-10-CM | POA: Diagnosis not present

## 2016-10-18 DIAGNOSIS — M5432 Sciatica, left side: Secondary | ICD-10-CM | POA: Diagnosis not present

## 2016-10-18 DIAGNOSIS — M9903 Segmental and somatic dysfunction of lumbar region: Secondary | ICD-10-CM | POA: Diagnosis not present

## 2016-10-19 MED FILL — METOPROLOL SUCC ER 25 MG TA: 25 | 90 days supply | Qty: 90 | Fill #0

## 2016-10-23 DIAGNOSIS — Z01419 Encounter for gynecological examination (general) (routine) without abnormal findings: Secondary | ICD-10-CM | POA: Diagnosis not present

## 2016-10-26 MED FILL — IBUPROFEN 800 MG TABS: 800 | 30 days supply | Qty: 60 | Fill #0

## 2016-11-10 MED FILL — ALLOPURINOL 100 MG TABLET: 100 | 30 days supply | Qty: 30 | Fill #5

## 2016-11-15 MED FILL — VIT D2 1.25 MG (50,000 UNIT: 1.25 MG | 28 days supply | Qty: 8 | Fill #3

## 2016-11-17 MED FILL — PHENTERMINE 37.5 MG TABLET: 37.5 | 30 days supply | Qty: 30 | Fill #0

## 2016-11-27 MED FILL — FOLIC ACID 1 MG TABLET: 1 | 90 days supply | Qty: 90 | Fill #1

## 2016-12-11 MED FILL — ALLOPURINOL 100 MG TABLET: 100 | 90 days supply | Qty: 90 | Fill #0

## 2016-12-26 MED FILL — PHENTERMINE 37.5 MG TABLET: 37.5 | 30 days supply | Qty: 30 | Fill #1

## 2016-12-26 MED FILL — VIT D2 1.25 MG (50,000 UNIT: 1.25 MG | 28 days supply | Qty: 8 | Fill #4

## 2017-01-15 NOTE — Progress Notes (Signed)
Office Visit Note  Patient: Laurie [name]             Date of Birth: January 20, 1966           MRN: [id number]             PCP: Fleet Contras, MD Referring: Fleet Contras, MD Visit Date: 01/19/2017 Occupation: Admission services at [facility]    Subjective:  Other (polyarthralgia, +ANA )   History of Present Illness: Laurie [name] is a 51 y.o. female seen in consultation per request of her PCP. According to patient her symptoms are started about 2 years ago with generalized pain and discomfort. She was diagnosed with fibromyalgia syndrome. She states that her symptoms got worse in the last 1 year with increased pain in her arms, legs and lower back. She had some lab work and her ANA was positive for that reason she was referred to me. She was also tried on different medications and finally on Lyrica. She states about a year ago she started having pain and swelling in her bilateral feet and ankles at the time her physician felt that she may have gout. And she was started on allopurinol. She's been taking allopurinol 100 mg a day. She has not noticed much improvement in her symptoms. She reports pain and swelling in her bilateral ankles and bilateral knee joints. She has some discomfort in her elbows, wrist joints in her hands.  Activities of Daily Living:  Patient reports morning stiffness for all day hours.   Patient Reports nocturnal pain.  Difficulty dressing/grooming: Denies Difficulty climbing stairs: Reports Difficulty getting out of chair: Reports Difficulty using hands for taps, buttons, cutlery, and/or writing: Denies   Review of Systems  Constitutional: Positive for fatigue. Negative for night sweats, weight gain, weight loss and weakness.  HENT: Negative for mouth sores, trouble swallowing, trouble swallowing, mouth dryness and nose dryness.   Eyes: Negative for pain, redness, visual disturbance and dryness.  Respiratory: Positive for shortness of breath. Negative for cough  and difficulty breathing.   Cardiovascular: Positive for palpitations. Negative for chest pain, hypertension, irregular heartbeat and swelling in legs/feet.       History of tachycardia  Gastrointestinal: Positive for constipation. Negative for blood in stool and diarrhea.  Endocrine: Negative for increased urination.  Genitourinary: Negative for vaginal dryness.  Musculoskeletal: Positive for arthralgias, joint pain, joint swelling and morning stiffness. Negative for myalgias, muscle weakness, muscle tenderness and myalgias.  Skin: Positive for color change. Negative for rash, hair loss, skin tightness, ulcers and sensitivity to sunlight.  Allergic/Immunologic: Negative for susceptible to infections.  Neurological: Negative for dizziness, memory loss and night sweats.  Hematological: Negative for swollen glands.  Psychiatric/Behavioral: Negative for depressed mood and sleep disturbance. The patient is not nervous/anxious.     PMFS History:  Patient Active Problem List   Diagnosis Date Noted  . Polyarthralgia 09/07/2016  . Other fatigue 09/07/2016  . Other insomnia 09/07/2016  . Gout of left foot 09/07/2016  . ANA positive 09/07/2016  . Chest pain with minimal risk for cardiac etiology 08/28/2015  . Intermittent palpitations 08/28/2015  . Allergic rhinitis   . Endometriosis   . Arthritis of right hip   . Fluid overload   . Fibromyalgia   . Sinus tachycardia   . Obesity, unspecified   . Eczematous dermatitis   . Paresthesias/numbness   . Esophageal reflux   . Edema of both legs   . Shortness of breath 06/21/2015  . Varicose  veins of bilateral lower extremities with other complications 04/26/2015  . Spider veins of both lower extremities 04/12/2015  . Gynecomastia 11/18/2013  . Premature menopause 07/09/2013  . Gout 02/08/2012  . Menopause 11/14/2011    Past Medical History:  Diagnosis Date  . Acne vulgaris 01/19/15  . Allergic rhinitis   . Anxiety 02/18/15  . Arthritis  of knee   . Arthritis of right hip   . Borderline diabetes 11/14/11  . Eczematous dermatitis 11/08/10  . Edema of both legs   . Endometriosis   . Esophageal reflux   . Fibromyalgia   . Gout 02/08/12   idopathic gout, left ankle and foot  . Gynecomastia 11/18/13   hypertrophy of breast  . Menopause 11/14/11  . Obesity, unspecified 07/04/05  . Paresthesias/numbness 11/14/11  . Premature menopause 07/09/13  . Sinus tachycardia   . Spider veins of both lower extremities 04/12/15  . Varicose veins   . Varicose veins of bilateral lower extremities with other complications 04/26/15    Family History  Problem Relation Age of Onset  . Healthy Father   . Cancer Mother        leukemia   . Heart failure Maternal Aunt   . Breast cancer Maternal Aunt    Past Surgical History:  Procedure Laterality Date  . Cardiac Event Monitor  03/2015   [facility]: Sinus rhythm with occasional sinus tachycardia. Rates as high as 122. No arrhythmias seen.  [facility] Kitchen. JOINT REPLACEMENT Right 05/23/2006  . TOTAL HIP ARTHROPLASTY  05/23/2006  . TRANSTHORACIC ECHOCARDIOGRAM  03/02/2015   [facility], Dr. Sherril CroonVyas: Normal LV size, mild-moderate concentric LVH with mild diffuse HK. EF~50%. Mild LA dilation. Could not exclude small ASD/PFO due to atrial septal dropout. Mild TR, mild MR.   Social History   Social History Narrative   Married now for 3 years. Mother of one.   This with her spouse. 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 Does not exercise.     Objective: Vital Signs: BP 126/89 (BP Location: Left Arm, Patient Position: Sitting, Cuff Size: Large)   Pulse 86   Resp 16   Ht 5\' 7"  (1.702 m)   Wt 226 lb (102.5 kg)   BMI 35.40 kg/m    Physical Exam  Constitutional: She is oriented to person, place, and time. She appears well-developed and well-nourished.  HENT:  Head: Normocephalic and atraumatic.  Eyes:  Conjunctivae and EOM are normal.  Neck: Normal range of motion.  Cardiovascular: Normal rate, regular rhythm, normal heart sounds and intact distal pulses.  Pulmonary/Chest: Effort normal and breath sounds normal.  Abdominal: Soft. Bowel sounds are normal.  Lymphadenopathy:    She has no cervical adenopathy.  Neurological: She is alert and oriented to person, place, and time.  Skin: Skin is warm and dry. Capillary refill takes less than 2 seconds.  Psychiatric: She has a normal mood and affect. Her behavior is normal.  Nursing note and vitals reviewed.    Musculoskeletal Exam: C-spine, thoracic, lumbar spine good range of motion. She has no SI joint tenderness. Shoulder joints elbow joints wrist joints are good range of motion. She is tenderness across MCP joints but no synovitis was noted. She is some prominence of PIP/DIP joints in her hands. No synovitis was noted. Right total hip replacement has limited range of motion. She has limited range of motion of her left hip. She had discomfort range  of motion of bilateral knee joints without any warmth swelling or effusion. She has discomfort range of motion of her ankle joints and tenderness across her MTPs without any warmth swelling or effusion.  CDAI Exam: No CDAI exam completed.    Investigation: No additional findings. Labs: 06/23/2016  Sed rate: 55 Uric acid: 6.5 RF: <14 ANA: positive, nucleolar 1:80  CBC & CMP WNL   Imaging: Xr Foot 2 Views Left  Result Date: 01/19/2017 First MTP narrowing, PIP/DIP narrowing was noted. No intertarsal joint space narrowing was noted. No erosive changes were noted. Impression: These findings are consistent with osteoarthritis of the foot.  Xr Foot 2 Views Right  Result Date: 01/19/2017 First MTP narrowing, PIP/DIP narrowing was noted. No intertarsal joint space narrowing was noted. No erosive changes were noted. Impression: These findings are consistent with osteoarthritis of the foot.  Xr  Hand 2 View Left  Result Date: 01/19/2017 Minimal CMC PIP/DIP narrowing was noted. No MCP joint narrowing or erosive changes were noted. No intercarpal or radiocarpal joint space narrowing was noted. Impression: These findings are consistent with mild osteoarthritis of the hand.  Xr Hand 2 View Right  Result Date: 01/19/2017 Minimal CMC PIP/DIP narrowing was noted. No MCP joint narrowing or erosive changes were noted. No intercarpal or radiocarpal joint space narrowing was noted. Impression: These findings are consistent with mild osteoarthritis of the hand.  Xr Knee 3 View Left  Result Date: 01/19/2017 Moderate medial compartment narrowing with medial intercondylar and lateral osteophytes. No chondrocalcinosis was noted. Moderate patellofemoral narrowing was noted. 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  Xr Knee 3 View Right  Result Date: 01/19/2017 Moderate medial compartment narrowing with medial intercondylar and lateral osteophytes. No chondrocalcinosis was noted. Moderate patellofemoral narrowing was noted. 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   Speciality Comments: No specialty comments available.    Procedures:  No procedures performed Allergies: Sulfa antibiotics and Sulfasalazine   Assessment / Plan:     Visit Diagnoses: Polyarthralgia - patient complains of polyarthralgia involving multiple joints. No active synovitis was noted on examination today. She does have tenderness across her MCPs and PIP joints. She also had normal discomfort with range of motion of her bilateral knee joints. She tenderness across MTP joints. She has limited range of motion of bilateral hip joints. I will obtain following labs to look for any underlying inflammatory or autoimmune process.  Pain in both hands - Plan: XR Hand 2 View Right, XR Hand 2 View Left.  The x-rays were consistent with mild osteoarthritis of the hand.  Pain in right foot - Plan: XR Foot 2 Views Right.Pain in left foot - Plan: XR Foot 2 Views Left: Mild osteoarthritis in bilateral feet was noted. Proper fitting shoes were discussed.  Chronic pain of right knee - Plan: XR KNEE 3 VIEW RIGHT,Chronic pain of left knee - Plan: XR KNEE 3 VIEW LEFT : She had moderate osteoarthritis in bilateral knee joints and moderate chondromalacia patella.  ANA positive - nucleolar 1:80 (06/23/2016) - Plan: ANA, Anti-DNA antibody, double-stranded, Anti-scleroderma antibody, Sjogrens syndrome-A extractable nuclear antibody, Sjogrens syndrome-B extractable nuclear antibody, RNP Antibody, Uric acid, Glucose 6 phosphate dehydrogenase, Angiotensin converting enzyme, Cyclic citrul peptide antibody, IgG, Serum protein electrophoresis with reflex, Cardiolipin antibodies, IgG, IgM, IgA, Beta-2 glycoprotein antibodies, Lupus Anticoagulant Eval w/Reflex  History of total right hip replacement: She has limited range of motion without discomfort.  Osteoarthritis left hip joint: Patient  has been followed by Dr.Olin. She has limited range of motion.  History of gout - patient gives history of swelling in her ankles and feet in the past. She states she's been on allopurinol for some time. She has not noticed much improvement so far. Her last uric acid was 6.5. allopurinol 100 mg po qd - Plan: Uric acid  Other fatigue - Plan: CBC with Differential/Platelet, COMPLETE METABOLIC PANEL WITH GFR, Urinalysis, Routine w reflex microscopic, Sedimentation rate, CK, TSH,   Other insomnia: Secondary to pain   Fibromyalgia: Patient complains of generalized pain and positive tender points. She's been on Lyrica which helps her.  History of gastroesophageal reflux (GERD)  Sinus tachycardia - fu by cardiologist     Orders: Orders Placed This Encounter  Procedures  . XR Hand 2 View Right  . XR Hand 2 View Left  . XR Foot 2  Views Right  . XR Foot 2 Views Left  . XR KNEE 3 VIEW RIGHT  . XR KNEE 3 VIEW LEFT  . CBC with Differential/Platelet  . COMPLETE METABOLIC PANEL WITH GFR  . Urinalysis, Routine w reflex microscopic  . Sedimentation rate  . CK  . TSH  . ANA  . Anti-DNA antibody, double-stranded  . Anti-scleroderma antibody  . Sjogrens syndrome-A extractable nuclear antibody  . Sjogrens syndrome-B extractable nuclear antibody  . RNP Antibody  . Uric acid  . Glucose 6 phosphate dehydrogenase  . Angiotensin converting enzyme  . Cyclic citrul peptide antibody, IgG  . Serum protein electrophoresis with reflex  . Cardiolipin antibodies, IgG, IgM, IgA  . Beta-2 glycoprotein antibodies  . Lupus Anticoagulant Eval w/Reflex   No orders of the defined types were placed in this encounter.   Face-to-face time spent with patient was 50 minutes. More than 50% of time was spent in counseling and coordination of care.  Follow-Up Instructions: Return for Polyarthralgia, positive ANA, fibromyalgia.   Pollyann SavoyShaili Deveshwar, MD  Note - This record has been created using Animal nutritionistDragon software.  Chart creation errors have been sought, but may not always  have been located. Such creation errors do not reflect on  the standard of medical care.

## 2017-01-19 ENCOUNTER — Encounter: Payer: Self-pay | Admitting: Rheumatology

## 2017-01-19 ENCOUNTER — Ambulatory Visit (INDEPENDENT_AMBULATORY_CARE_PROVIDER_SITE_OTHER): Payer: 59

## 2017-01-19 ENCOUNTER — Ambulatory Visit (INDEPENDENT_AMBULATORY_CARE_PROVIDER_SITE_OTHER): Payer: Self-pay

## 2017-01-19 ENCOUNTER — Ambulatory Visit: Payer: 59 | Admitting: Rheumatology

## 2017-01-19 VITALS — BP 126/89 | HR 86 | Resp 16 | Ht 67.0 in | Wt 226.0 lb

## 2017-01-19 DIAGNOSIS — G4709 Other insomnia: Secondary | ICD-10-CM

## 2017-01-19 DIAGNOSIS — M25561 Pain in right knee: Secondary | ICD-10-CM | POA: Diagnosis not present

## 2017-01-19 DIAGNOSIS — R5383 Other fatigue: Secondary | ICD-10-CM | POA: Diagnosis not present

## 2017-01-19 DIAGNOSIS — M79672 Pain in left foot: Secondary | ICD-10-CM

## 2017-01-19 DIAGNOSIS — Z96641 Presence of right artificial hip joint: Secondary | ICD-10-CM

## 2017-01-19 DIAGNOSIS — R000 Tachycardia, unspecified: Secondary | ICD-10-CM | POA: Diagnosis not present

## 2017-01-19 DIAGNOSIS — Z8719 Personal history of other diseases of the digestive system: Secondary | ICD-10-CM

## 2017-01-19 DIAGNOSIS — M79642 Pain in left hand: Secondary | ICD-10-CM

## 2017-01-19 DIAGNOSIS — M797 Fibromyalgia: Secondary | ICD-10-CM

## 2017-01-19 DIAGNOSIS — Z8739 Personal history of other diseases of the musculoskeletal system and connective tissue: Secondary | ICD-10-CM | POA: Diagnosis not present

## 2017-01-19 DIAGNOSIS — M79671 Pain in right foot: Secondary | ICD-10-CM | POA: Diagnosis not present

## 2017-01-19 DIAGNOSIS — G8929 Other chronic pain: Secondary | ICD-10-CM

## 2017-01-19 DIAGNOSIS — M79641 Pain in right hand: Secondary | ICD-10-CM

## 2017-01-19 DIAGNOSIS — R768 Other specified abnormal immunological findings in serum: Secondary | ICD-10-CM | POA: Diagnosis not present

## 2017-01-19 DIAGNOSIS — M25562 Pain in left knee: Secondary | ICD-10-CM

## 2017-01-19 DIAGNOSIS — M2550 Pain in unspecified joint: Secondary | ICD-10-CM

## 2017-01-25 LAB — LUPUS ANTICOAGULANT EVAL W/ REFLEX
PTT LA SCREEN: 37 s (ref <=40)
dRVVT Screen: 46 s — ABNORMAL HIGH (ref <=45)

## 2017-01-25 LAB — CBC WITH DIFFERENTIAL/PLATELET
Basophils Absolute: 95 cells/uL (ref 0–200)
Basophils Relative: 0.9 %
Eosinophils Absolute: 360 cells/uL (ref 15–500)
Eosinophils Relative: 3.4 %
HCT: 40 % (ref 35.0–45.0)
Hemoglobin: 12.8 g/dL (ref 11.7–15.5)
Lymphs Abs: 3710 cells/uL (ref 850–3900)
MCH: 25.3 pg — ABNORMAL LOW (ref 27.0–33.0)
MCHC: 32 g/dL (ref 32.0–36.0)
MCV: 79.2 fL — ABNORMAL LOW (ref 80.0–100.0)
MPV: 10.3 fL (ref 7.5–12.5)
Monocytes Relative: 5.9 %
Neutro Abs: 5809 cells/uL (ref 1500–7800)
Neutrophils Relative %: 54.8 %
Platelets: 323 10*3/uL (ref 140–400)
RBC: 5.05 10*6/uL (ref 3.80–5.10)
RDW: 14.1 % (ref 11.0–15.0)
Total Lymphocyte: 35 %
WBC mixed population: 625 cells/uL (ref 200–950)
WBC: 10.6 10*3/uL (ref 3.8–10.8)

## 2017-01-25 LAB — COMPLETE METABOLIC PANEL WITH GFR
AG Ratio: 1.1 (calc) (ref 1.0–2.5)
ALT: 15 U/L (ref 6–29)
AST: 15 U/L (ref 10–35)
Albumin: 3.9 g/dL (ref 3.6–5.1)
Alkaline phosphatase (APISO): 80 U/L (ref 33–130)
BUN: 19 mg/dL (ref 7–25)
CO2: 26 mmol/L (ref 20–32)
Calcium: 8.7 mg/dL (ref 8.6–10.4)
Chloride: 103 mmol/L (ref 98–110)
Creat: 0.9 mg/dL (ref 0.50–1.05)
GFR, Est African American: 86 mL/min/{1.73_m2} (ref >=60)
GFR, Est Non African American: 75 mL/min/{1.73_m2} (ref >=60)
Globulin: 3.4 g/dL (calc) (ref 1.9–3.7)
Glucose, Bld: 85 mg/dL (ref 65–99)
Potassium: 3.8 mmol/L (ref 3.5–5.3)
Sodium: 140 mmol/L (ref 135–146)
Total Bilirubin: 0.3 mg/dL (ref 0.2–1.2)
Total Protein: 7.3 g/dL (ref 6.1–8.1)

## 2017-01-25 LAB — TSH: TSH: 1.87 mIU/L

## 2017-01-25 LAB — SJOGRENS SYNDROME-B EXTRACTABLE NUCLEAR ANTIBODY: SSB (La) (ENA) Antibody, IgG: <1 AI

## 2017-01-25 LAB — URINALYSIS, ROUTINE W REFLEX MICROSCOPIC
Bacteria, UA: NONE SEEN /HPF
Bilirubin Urine: NEGATIVE
Glucose, UA: NEGATIVE
Hyaline Cast: NONE SEEN /LPF
Ketones, ur: NEGATIVE
Leukocytes, UA: NEGATIVE
Nitrite: NEGATIVE
Protein, ur: NEGATIVE
Specific Gravity, Urine: 1.023 (ref 1.001–1.03)
Squamous Epithelial / LPF: NONE SEEN /HPF (ref <=5)
WBC, UA: NONE SEEN /HPF (ref 0–5)
pH: 5.5 (ref 5.0–8.0)

## 2017-01-25 LAB — PROTEIN ELECTROPHORESIS, SERUM, WITH REFLEX
Albumin ELP: 4.2 g/dL (ref 3.8–4.8)
Alpha 1: 0.4 g/dL — ABNORMAL HIGH (ref 0.2–0.3)
Alpha 2: 1 g/dL — ABNORMAL HIGH (ref 0.5–0.9)
Beta 2: 0.5 g/dL (ref 0.2–0.5)
Beta Globulin: 0.4 g/dL (ref 0.4–0.6)
Gamma Globulin: 1.6 g/dL (ref 0.8–1.7)
Total Protein: 8 g/dL (ref 6.1–8.1)

## 2017-01-25 LAB — ANTI-NUCLEAR AB-TITER (ANA TITER): ANA Titer 1: 1:40 {titer} — ABNORMAL HIGH

## 2017-01-25 LAB — CARDIOLIPIN ANTIBODIES, IGG, IGM, IGA
Anticardiolipin IgA: <11 [APL'U]
Anticardiolipin IgG: <14 [GPL'U]
Anticardiolipin IgM: <12 [MPL'U]

## 2017-01-25 LAB — RFLX DRVVT CONFRIM: dRVVT Confirm: NEGATIVE

## 2017-01-25 LAB — URIC ACID: Uric Acid, Serum: 6.1 mg/dL (ref 2.5–7.0)

## 2017-01-25 LAB — ANA: Anti Nuclear Antibody(ANA): POSITIVE — AB

## 2017-01-25 LAB — BETA-2 GLYCOPROTEIN ANTIBODIES
Beta-2 Glyco 1 IgA: <9 SAU (ref <=20)
Beta-2 Glyco 1 IgM: <9 SMU (ref <=20)
Beta-2 Glyco I IgG: <9 SGU (ref <=20)

## 2017-01-25 LAB — CYCLIC CITRUL PEPTIDE ANTIBODY, IGG: Cyclic Citrullin Peptide Ab: <16 UNITS

## 2017-01-25 LAB — SEDIMENTATION RATE: Sed Rate: 51 mm/h — ABNORMAL HIGH (ref 0–20)

## 2017-01-25 LAB — CK: Total CK: 139 U/L (ref 29–143)

## 2017-01-25 LAB — SJOGRENS SYNDROME-A EXTRACTABLE NUCLEAR ANTIBODY: SSA (Ro) (ENA) Antibody, IgG: <1 AI

## 2017-01-25 LAB — ANGIOTENSIN CONVERTING ENZYME: Angiotensin-Converting Enzyme: 87 U/L — ABNORMAL HIGH (ref 9–67)

## 2017-01-25 LAB — ANTI-DNA ANTIBODY, DOUBLE-STRANDED: ds DNA Ab: 2 IU/mL

## 2017-01-25 LAB — RNP ANTIBODY: Ribonucleic Protein(ENA) Antibody, IgG: <1 AI

## 2017-01-25 LAB — ANTI-SCLERODERMA ANTIBODY: Scleroderma (Scl-70) (ENA) Antibody, IgG: <1 AI

## 2017-01-25 LAB — GLUCOSE 6 PHOSPHATE DEHYDROGENASE: G-6PDH: 21.1 U/g Hgb — ABNORMAL HIGH (ref 7.0–20.5)

## 2017-01-25 NOTE — Progress Notes (Signed)
ESR is high. She can have repeat ESR in 1 month with her PCP. All autoimmune workup was negative. ANA is not significant. She had no clinical features of autoimmune disease on examination.

## 2017-01-26 MED FILL — METOPROLOL SUCC ER 25 MG TA: 25 | 90 days supply | Qty: 90 | Fill #1

## 2017-02-02 ENCOUNTER — Encounter: Payer: Self-pay | Admitting: *Deleted

## 2017-02-06 MED FILL — VIT D2 1.25 MG (50,000 UNIT: 1.25 MG | 28 days supply | Qty: 8 | Fill #5

## 2017-02-06 MED FILL — PHENTERMINE 37.5 MG TABLET: 37.5 | 30 days supply | Qty: 30 | Fill #2

## 2017-02-16 DIAGNOSIS — M1612 Unilateral primary osteoarthritis, left hip: Secondary | ICD-10-CM | POA: Insufficient documentation

## 2017-02-16 DIAGNOSIS — Z96641 Presence of right artificial hip joint: Secondary | ICD-10-CM | POA: Insufficient documentation

## 2017-02-16 DIAGNOSIS — M19042 Primary osteoarthritis, left hand: Secondary | ICD-10-CM

## 2017-02-16 DIAGNOSIS — M19071 Primary osteoarthritis, right ankle and foot: Secondary | ICD-10-CM | POA: Insufficient documentation

## 2017-02-16 DIAGNOSIS — M170 Bilateral primary osteoarthritis of knee: Secondary | ICD-10-CM | POA: Insufficient documentation

## 2017-02-16 DIAGNOSIS — M19072 Primary osteoarthritis, left ankle and foot: Secondary | ICD-10-CM

## 2017-02-16 DIAGNOSIS — M19041 Primary osteoarthritis, right hand: Secondary | ICD-10-CM | POA: Insufficient documentation

## 2017-02-16 NOTE — Progress Notes (Deleted)
Office Visit Note  Patient: Laurie [name]             Date of Birth: 28-Oct-1965           MRN: [id number]             PCP: Fleet Contras, MD Referring: Fleet Contras, MD Visit Date: 02/21/2017 Occupation: @GUAROCC @    Subjective:  No chief complaint on file.   History of Present Illness: Laurie [name] is a 52 y.o. female ***   Activities of Daily Living:  Patient reports morning stiffness for *** {minute/hour:19697}.   Patient {ACTIONS;DENIES/REPORTS:21021675::"Denies"} nocturnal pain.  Difficulty dressing/grooming: {ACTIONS;DENIES/REPORTS:21021675::"Denies"} Difficulty climbing stairs: {ACTIONS;DENIES/REPORTS:21021675::"Denies"} Difficulty getting out of chair: {ACTIONS;DENIES/REPORTS:21021675::"Denies"} Difficulty using hands for taps, buttons, cutlery, and/or writing: {ACTIONS;DENIES/REPORTS:21021675::"Denies"}   No Rheumatology ROS completed.   PMFS History:  Patient Active Problem List   Diagnosis Date Noted  . Polyarthralgia 09/07/2016  . Other fatigue 09/07/2016  . Other insomnia 09/07/2016  . Gout of left foot 09/07/2016  . ANA positive 09/07/2016  . Chest pain with minimal risk for cardiac etiology 08/28/2015  . Intermittent palpitations 08/28/2015  . Allergic rhinitis   . Endometriosis   . Arthritis of right hip   . Fluid overload   . Fibromyalgia   . Sinus tachycardia   . Obesity, unspecified   . Eczematous dermatitis   . Paresthesias/numbness   . Esophageal reflux   . Edema of both legs   . Shortness of breath 06/21/2015  . Varicose veins of bilateral lower extremities with other complications 04/26/2015  . Spider veins of both lower extremities 04/12/2015  . Gynecomastia 11/18/2013  . Premature menopause 07/09/2013  . Gout 02/08/2012  . Menopause 11/14/2011    Past Medical History:  Diagnosis Date  . Acne vulgaris 01/19/15  . Allergic rhinitis   . Anxiety 02/18/15  . Arthritis of knee   . Arthritis of right hip   . Borderline  diabetes 11/14/11  . Eczematous dermatitis 11/08/10  . Edema of both legs   . Endometriosis   . Esophageal reflux   . Fibromyalgia   . Gout 02/08/12   idopathic gout, left ankle and foot  . Gynecomastia 11/18/13   hypertrophy of breast  . Menopause 11/14/11  . Obesity, unspecified 07/04/05  . Paresthesias/numbness 11/14/11  . Premature menopause 07/09/13  . Sinus tachycardia   . Spider veins of both lower extremities 04/12/15  . Varicose veins   . Varicose veins of bilateral lower extremities with other complications 04/26/15    Family History  Problem Relation Age of Onset  . Healthy Father   . Cancer Mother        leukemia   . Heart failure Maternal Aunt   . Breast cancer Maternal Aunt    Past Surgical History:  Procedure Laterality Date  . Cardiac Event Monitor  03/2015   [facility]: Sinus rhythm with occasional sinus tachycardia. Rates as high as 122. No arrhythmias seen.  Marland Kitchen JOINT REPLACEMENT Right 05/23/2006  . TOTAL HIP ARTHROPLASTY  05/23/2006  . TRANSTHORACIC ECHOCARDIOGRAM  03/02/2015   [facility], Dr. Sherril Croon: Normal LV size, mild-moderate concentric LVH with mild diffuse HK. EF~50%. Mild LA dilation. Could not exclude small ASD/PFO due to atrial septal dropout. Mild TR, mild MR.   Social History   Social History Narrative   Married now for 3 years. Mother of one.   This with her spouse. Is a Estate manager/land agent for [employer]   Does not drink does not  smoke and does not use illicit drugs. Does not exercise.     Objective: Vital Signs: There were no vitals taken for this visit.   Physical Exam   Musculoskeletal Exam: ***  CDAI Exam: No CDAI exam completed.    Investigation: No additional findings. CBC Latest Ref Rng & Units 01/19/2017 02/26/2015 07/01/2012  WBC 3.8 - 10.8 Thousand/uL 10.6 11.2(H) 8.5  Hemoglobin 11.7 - 15.5 g/dL 16.112.8 09.612.4 10.4(L)  Hematocrit 35.0 - 45.0 % 40.0 39.4 32.1(L)  Platelets 140 - 400 Thousand/uL  323 289 345   CMP     Component Value Date/Time   NA 140 01/19/2017 1000   K 3.8 01/19/2017 1000   CL 103 01/19/2017 1000   CO2 26 01/19/2017 1000   GLUCOSE 85 01/19/2017 1000   BUN 19 01/19/2017 1000   CREATININE 0.90 01/19/2017 1000   CALCIUM 8.7 01/19/2017 1000   PROT 7.3 01/19/2017 1000   PROT 8.0 01/19/2017 1000   ALBUMIN 3.5 07/01/2012 1800   AST 15 01/19/2017 1000   ALT 15 01/19/2017 1000   ALKPHOS 71 07/01/2012 1800   BILITOT 0.3 01/19/2017 1000   GFRNONAA 75 01/19/2017 1000   GFRAA 86 01/19/2017 1000  UA negative, SPEP nonspecific, CK 139, TSH normal Lupus anticoagulant negative, anticardiolipin negative, beta 2 negative, ENA negative, ANA 1:40 nucleolar, G6PD normal, anti-CCP negative, Ace 87 elevated, uric acid 6.1 Imaging: Xr Foot 2 Views Left  Result Date: 01/19/2017 First MTP narrowing, PIP/DIP narrowing was noted. No intertarsal joint space narrowing was noted. No erosive changes were noted. Impression: These findings are consistent with osteoarthritis of the foot.  Xr Foot 2 Views Right  Result Date: 01/19/2017 First MTP narrowing, PIP/DIP narrowing was noted. No intertarsal joint space narrowing was noted. No erosive changes were noted. Impression: These findings are consistent with osteoarthritis of the foot.  Xr Hand 2 View Left  Result Date: 01/19/2017 Minimal CMC PIP/DIP narrowing was noted. No MCP joint narrowing or erosive changes were noted. No intercarpal or radiocarpal joint space narrowing was noted. Impression: These findings are consistent with mild osteoarthritis of the hand.  Xr Hand 2 View Right  Result Date: 01/19/2017 Minimal CMC PIP/DIP narrowing was noted. No MCP joint narrowing or erosive changes were noted. No intercarpal or radiocarpal joint space narrowing was noted. Impression: These findings are consistent with mild osteoarthritis of the hand.  Xr Knee 3 View Left  Result Date: 01/19/2017 Moderate medial compartment narrowing  with medial intercondylar and lateral osteophytes. No chondrocalcinosis was noted. Moderate patellofemoral narrowing was noted. 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  Xr Knee 3 View Right  Result Date: 01/19/2017 Moderate medial compartment narrowing with medial intercondylar and lateral osteophytes. No chondrocalcinosis was noted. Moderate patellofemoral narrowing was noted. 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   Speciality Comments: No specialty comments available.    Procedures:  No procedures performed Allergies: Sulfa antibiotics and Sulfasalazine   Assessment / Plan:     Visit Diagnoses: No diagnosis found.    Orders: No orders of the defined types were placed in this encounter.  No orders of the defined types were placed in this encounter.   Face-to-face time spent with patient was *** minutes. 50% of time was spent in counseling and coordination of care.  Follow-Up Instructions: No Follow-up on file.   Pollyann SavoyShaili Deveshwar, MD  Note - This record has been created using Animal nutritionistDragon software.  Chart creation errors have  been sought, but may not always  have been located. Such creation errors do not reflect on  the standard of medical care.

## 2017-02-21 ENCOUNTER — Ambulatory Visit: Payer: Self-pay | Admitting: Rheumatology

## 2017-03-01 DIAGNOSIS — R7301 Impaired fasting glucose: Secondary | ICD-10-CM | POA: Diagnosis not present

## 2017-03-01 DIAGNOSIS — J302 Other seasonal allergic rhinitis: Secondary | ICD-10-CM | POA: Diagnosis not present

## 2017-03-01 DIAGNOSIS — J069 Acute upper respiratory infection, unspecified: Secondary | ICD-10-CM | POA: Diagnosis not present

## 2017-03-01 DIAGNOSIS — E669 Obesity, unspecified: Secondary | ICD-10-CM | POA: Diagnosis not present

## 2017-03-01 MED FILL — FOLIC ACID 1 MG TABLET: 1 | 90 days supply | Qty: 90 | Fill #2

## 2017-03-01 MED FILL — predniSONE 20 MG TABS: 20 | 7 days supply | Qty: 10 | Fill #0

## 2017-03-01 MED FILL — FLUTICASONE PROP 50 MCG SPR: 50 | 60 days supply | Qty: 16 | Fill #0

## 2017-03-13 MED FILL — ALLOPURINOL 100 MG TABLET: 100 | 90 days supply | Qty: 90 | Fill #1

## 2017-03-13 MED FILL — PHENTERMINE 37.5 MG TABLET: 37.5 | 30 days supply | Qty: 30 | Fill #0

## 2017-03-14 MED FILL — VIT D2 1.25 MG (50,000 UNIT: 1.25 MG | 28 days supply | Qty: 8 | Fill #0

## 2017-03-21 MED FILL — AZITHROMYCIN 250 MG TABLET: 250 | 5 days supply | Qty: 6 | Fill #0

## 2017-03-22 DIAGNOSIS — J302 Other seasonal allergic rhinitis: Secondary | ICD-10-CM | POA: Diagnosis not present

## 2017-03-22 DIAGNOSIS — M609 Myositis, unspecified: Secondary | ICD-10-CM | POA: Diagnosis not present

## 2017-03-22 DIAGNOSIS — R7303 Prediabetes: Secondary | ICD-10-CM | POA: Diagnosis not present

## 2017-03-22 DIAGNOSIS — E669 Obesity, unspecified: Secondary | ICD-10-CM | POA: Diagnosis not present

## 2017-03-22 DIAGNOSIS — J019 Acute sinusitis, unspecified: Secondary | ICD-10-CM | POA: Diagnosis not present

## 2017-04-25 MED FILL — METOPROLOL SUCCINATE ER 25: 25 | 90 days supply | Qty: 90 | Fill #2

## 2017-05-03 MED FILL — PHENTERMINE 37.5 MG TABLET: 37.5 | 30 days supply | Qty: 30 | Fill #1

## 2017-05-31 MED FILL — VIT D2 1.25 MG (50,000 UNIT: 1.25 MG | 28 days supply | Qty: 8 | Fill #1

## 2017-05-31 MED FILL — FOLIC ACID 1 MG TABS: 1 | 90 days supply | Qty: 90 | Fill #3

## 2017-06-07 MED FILL — PHENTERMINE 37.5 MG TABLET: 37.5 | 30 days supply | Qty: 30 | Fill #2

## 2017-06-07 MED FILL — ALLOPURINOL 100 MG TABLET: 100 | 90 days supply | Qty: 90 | Fill #2

## 2017-07-23 ENCOUNTER — Other Ambulatory Visit: Payer: Self-pay | Admitting: Internal Medicine

## 2017-07-23 DIAGNOSIS — Z1231 Encounter for screening mammogram for malignant neoplasm of breast: Secondary | ICD-10-CM

## 2017-07-26 ENCOUNTER — Ambulatory Visit
Admission: RE | Admit: 2017-07-26 | Discharge: 2017-07-26 | Disposition: A | Discharge status: Home / Self Care | Payer: 59 | Source: Ambulatory Visit | Attending: Internal Medicine | Admitting: Internal Medicine

## 2017-07-26 DIAGNOSIS — Z1231 Encounter for screening mammogram for malignant neoplasm of breast: Secondary | ICD-10-CM | POA: Diagnosis not present

## 2017-07-26 MED FILL — METOPROLOL SUCCINATE ER 25: 25 | 90 days supply | Qty: 90 | Fill #0

## 2017-08-09 DIAGNOSIS — R7303 Prediabetes: Secondary | ICD-10-CM | POA: Diagnosis not present

## 2017-08-09 DIAGNOSIS — K5904 Chronic idiopathic constipation: Secondary | ICD-10-CM | POA: Diagnosis not present

## 2017-08-09 DIAGNOSIS — E669 Obesity, unspecified: Secondary | ICD-10-CM | POA: Diagnosis not present

## 2017-08-09 DIAGNOSIS — M609 Myositis, unspecified: Secondary | ICD-10-CM | POA: Diagnosis not present

## 2017-08-09 DIAGNOSIS — M179 Osteoarthritis of knee, unspecified: Secondary | ICD-10-CM | POA: Diagnosis not present

## 2017-08-09 DIAGNOSIS — L259 Unspecified contact dermatitis, unspecified cause: Secondary | ICD-10-CM | POA: Diagnosis not present

## 2017-08-09 DIAGNOSIS — M10072 Idiopathic gout, left ankle and foot: Secondary | ICD-10-CM | POA: Diagnosis not present

## 2017-08-09 MED FILL — VIT D2 1.25 MG (50,000 UNIT: 1.25 MG | 28 days supply | Qty: 8 | Fill #0

## 2017-08-09 MED FILL — TRIAMCINOLONE 0.1% CREAM: 0.1 | 30 days supply | Qty: 454 | Fill #0

## 2017-08-20 MED FILL — ALLOPURINOL 100 MG TABLET: 100 | 90 days supply | Qty: 90 | Fill #0

## 2017-08-21 MED FILL — PHENTERMINE 37.5 MG TABLET: 37.5 | 30 days supply | Qty: 30 | Fill #0

## 2017-09-03 MED FILL — OMEPRAZOLE 20 MG CAP: 20 | 90 days supply | Qty: 180 | Fill #0

## 2017-09-03 MED FILL — VIT D2 1.25 MG (50,000 UNIT: 1.25 MG | 28 days supply | Qty: 8 | Fill #1

## 2017-09-03 MED FILL — FOLIC ACID 1 MG TABS: 1 | 90 days supply | Qty: 90 | Fill #0

## 2017-09-20 DIAGNOSIS — R7303 Prediabetes: Secondary | ICD-10-CM | POA: Diagnosis not present

## 2017-09-20 DIAGNOSIS — L26 Exfoliative dermatitis: Secondary | ICD-10-CM | POA: Diagnosis not present

## 2017-09-20 DIAGNOSIS — M609 Myositis, unspecified: Secondary | ICD-10-CM | POA: Diagnosis not present

## 2017-09-20 DIAGNOSIS — E669 Obesity, unspecified: Secondary | ICD-10-CM | POA: Diagnosis not present

## 2017-09-20 DIAGNOSIS — L259 Unspecified contact dermatitis, unspecified cause: Secondary | ICD-10-CM | POA: Diagnosis not present

## 2017-09-20 MED FILL — MOMETASONE FUROATE 0.1% CRM: 0.1 | 25 days supply | Qty: 60 | Fill #0

## 2017-09-20 MED FILL — DOXYCYCLINE HYCLATE 100 MG: 100 | 7 days supply | Qty: 14 | Fill #0

## 2017-09-20 MED FILL — CLOTRIMAZOLE-BETAMETHASONE: 1-0.05 | 25 days supply | Qty: 60 | Fill #0

## 2017-10-24 DIAGNOSIS — Z01419 Encounter for gynecological examination (general) (routine) without abnormal findings: Secondary | ICD-10-CM | POA: Diagnosis not present

## 2017-10-24 DIAGNOSIS — R350 Frequency of micturition: Secondary | ICD-10-CM | POA: Diagnosis not present

## 2017-10-25 MED FILL — METOPROLOL SUCCINATE ER 25: 25 | 90 days supply | Qty: 90 | Fill #1

## 2017-10-25 MED FILL — VIT D2 1.25 MG (50,000 UNIT: 1.25 MG | 28 days supply | Qty: 8 | Fill #2

## 2017-11-15 DIAGNOSIS — M609 Myositis, unspecified: Secondary | ICD-10-CM | POA: Diagnosis not present

## 2017-11-15 DIAGNOSIS — K5904 Chronic idiopathic constipation: Secondary | ICD-10-CM | POA: Diagnosis not present

## 2017-11-15 DIAGNOSIS — Z1211 Encounter for screening for malignant neoplasm of colon: Secondary | ICD-10-CM | POA: Diagnosis not present

## 2017-11-15 DIAGNOSIS — R7303 Prediabetes: Secondary | ICD-10-CM | POA: Diagnosis not present

## 2017-11-15 DIAGNOSIS — M179 Osteoarthritis of knee, unspecified: Secondary | ICD-10-CM | POA: Diagnosis not present

## 2017-12-05 MED FILL — ALLOPURINOL 100 MG TABLET: 100 | 90 days supply | Qty: 90 | Fill #1

## 2017-12-05 MED FILL — FOLIC ACID 1 MG TABS: 1 | 90 days supply | Qty: 90 | Fill #1

## 2017-12-31 MED FILL — IBUPROFEN 800 MG TAB: 800 | 90 days supply | Qty: 180 | Fill #0

## 2018-01-01 DIAGNOSIS — N3001 Acute cystitis with hematuria: Secondary | ICD-10-CM | POA: Diagnosis not present

## 2018-01-01 DIAGNOSIS — M25559 Pain in unspecified hip: Secondary | ICD-10-CM | POA: Diagnosis not present

## 2018-01-01 DIAGNOSIS — M609 Myositis, unspecified: Secondary | ICD-10-CM | POA: Diagnosis not present

## 2018-01-01 DIAGNOSIS — J302 Other seasonal allergic rhinitis: Secondary | ICD-10-CM | POA: Diagnosis not present

## 2018-01-01 DIAGNOSIS — R7303 Prediabetes: Secondary | ICD-10-CM | POA: Diagnosis not present

## 2018-01-01 DIAGNOSIS — K5904 Chronic idiopathic constipation: Secondary | ICD-10-CM | POA: Diagnosis not present

## 2018-01-01 DIAGNOSIS — E2839 Other primary ovarian failure: Secondary | ICD-10-CM | POA: Diagnosis not present

## 2018-01-01 DIAGNOSIS — M179 Osteoarthritis of knee, unspecified: Secondary | ICD-10-CM | POA: Diagnosis not present

## 2018-01-01 MED FILL — FLUTICASONE PROP 50 MCG SPR: 50 | 30 days supply | Qty: 16 | Fill #0

## 2018-01-01 MED FILL — NITROFURANTOIN MONO-MCR 100: 100 | 7 days supply | Qty: 14 | Fill #0

## 2018-01-01 MED FILL — FLUCONAZOLE 150 MG TABS: 150 | 14 days supply | Qty: 2 | Fill #0

## 2018-01-01 MED FILL — GABAPENTIN 100 MG CAPS: 100 | 30 days supply | Qty: 90 | Fill #0

## 2018-01-03 ENCOUNTER — Other Ambulatory Visit: Payer: Self-pay | Admitting: Internal Medicine

## 2018-01-03 DIAGNOSIS — E2839 Other primary ovarian failure: Secondary | ICD-10-CM

## 2018-01-08 ENCOUNTER — Ambulatory Visit
Admission: RE | Admit: 2018-01-08 | Discharge: 2018-01-08 | Disposition: A | Discharge status: Home / Self Care | Payer: 59 | Source: Ambulatory Visit | Attending: Internal Medicine | Admitting: Internal Medicine

## 2018-01-08 ENCOUNTER — Encounter: Payer: Self-pay | Admitting: Radiology

## 2018-01-08 DIAGNOSIS — Z780 Asymptomatic menopausal state: Secondary | ICD-10-CM | POA: Diagnosis not present

## 2018-01-08 DIAGNOSIS — M8588 Other specified disorders of bone density and structure, other site: Secondary | ICD-10-CM | POA: Diagnosis not present

## 2018-01-08 DIAGNOSIS — E2839 Other primary ovarian failure: Secondary | ICD-10-CM

## 2018-01-25 MED FILL — METOPROLOL SUCCINATE ER 25: 25 | 90 days supply | Qty: 90 | Fill #0

## 2018-02-08 DIAGNOSIS — M25552 Pain in left hip: Secondary | ICD-10-CM | POA: Diagnosis not present

## 2018-02-08 DIAGNOSIS — Z96641 Presence of right artificial hip joint: Secondary | ICD-10-CM | POA: Diagnosis not present

## 2018-02-08 DIAGNOSIS — Z471 Aftercare following joint replacement surgery: Secondary | ICD-10-CM | POA: Diagnosis not present

## 2018-02-12 DIAGNOSIS — M12851 Other specific arthropathies, not elsewhere classified, right hip: Secondary | ICD-10-CM | POA: Diagnosis not present

## 2018-02-12 DIAGNOSIS — M609 Myositis, unspecified: Secondary | ICD-10-CM | POA: Diagnosis not present

## 2018-02-12 DIAGNOSIS — K5904 Chronic idiopathic constipation: Secondary | ICD-10-CM | POA: Diagnosis not present

## 2018-02-12 DIAGNOSIS — R7303 Prediabetes: Secondary | ICD-10-CM | POA: Diagnosis not present

## 2018-02-13 MED FILL — CICLOPIROX 8% SOLUTION: 8 | 30 days supply | Qty: 7 | Fill #0

## 2018-02-13 MED FILL — GABAPENTIN 300 MG CAPSULE: 300 | 30 days supply | Qty: 90 | Fill #0

## 2018-02-13 MED FILL — NAPROXEN 500 MG TABLET: 500 | 90 days supply | Qty: 180 | Fill #0

## 2018-02-26 ENCOUNTER — Telehealth: Payer: Self-pay | Admitting: *Deleted

## 2018-02-26 NOTE — Telephone Encounter (Signed)
   [facility] Pre-operative Risk Assessment    Request for surgical clearance:  1. What type of surgery is being performed? LEFTTOTAL HIP  2. When is this surgery scheduled?  04/02/18  3. What type of clearance is required (medical clearance vs. Pharmacy clearance to hold med vs. Both)? MEDICAL  4. Are there any medications that need to be held prior to surgery and how long? N/A  5. Practice name and name of physician performing surgery? [facility]; DR OLIN  6. What is your office phone number [phone]   7.   What is your office fax number [email or web address]  8.   Anesthesia type (None, local, MAC, general) ? SPINAL   Laurie [name] 02/26/2018, 5:38 PM  _________________________________________________________________   (provider comments below)

## 2018-03-04 NOTE — Telephone Encounter (Signed)
Spoke with patient and scheduled a follow up appt with Lorin Picket for pre op clearance. Patient voiced understanding.

## 2018-03-04 NOTE — Telephone Encounter (Signed)
   Primary Cardiologist: Dr. Herbie Baltimore as of 07/2015  Chart reviewed as part of pre-operative protocol coverage. Because of Laurie [name]'s past medical history and time since last visit, he/she will require a follow-up visit in order to better assess preoperative cardiovascular risk.  Pre-op covering staff: - Please schedule appointment and call patient to inform them. - Please contact requesting surgeon's office via preferred method (i.e, phone, fax) to inform them of need for appointment prior to surgery.  If applicable, this message will also be routed to pharmacy pool and/or primary cardiologist for input on holding anticoagulant/antiplatelet agent as requested below so that this information is available at time of patient's appointment.   Roe Rutherford Duke, PA  03/04/2018, 4:04 PM

## 2018-03-06 ENCOUNTER — Telehealth: Payer: Self-pay

## 2018-03-06 NOTE — Telephone Encounter (Signed)
   [facility] Pre-operative Risk Assessment    Request for surgical clearance:  1. What type of surgery is being performed? Left total hip   2. When is this surgery scheduled?  04/02/2018   3. What type of clearance is required (medical clearance vs. Pharmacy clearance to hold med vs. Both)?  medical  4. Are there any medications that need to be held prior to surgery and how long? none   5. Practice name and name of physician performing surgery? [facility] - Dr. Alvan Dame  6. What is your office phone number [phone]   7.   What is your office fax number [phone] attn: Judeen Hammans  8.   Anesthesia type (None, local, MAC, general) ?  spinal   Ena Dawley 03/06/2018, 10:48 AM  _________________________________________________________________   (provider comments below)

## 2018-03-08 DIAGNOSIS — M25552 Pain in left hip: Secondary | ICD-10-CM | POA: Diagnosis not present

## 2018-03-08 DIAGNOSIS — M1612 Unilateral primary osteoarthritis, left hip: Secondary | ICD-10-CM | POA: Diagnosis not present

## 2018-03-12 ENCOUNTER — Ambulatory Visit: Payer: BLUE CROSS/BLUE SHIELD | Admitting: Adult Health

## 2018-03-12 ENCOUNTER — Encounter: Payer: Self-pay | Admitting: Adult Health

## 2018-03-12 VITALS — BP 129/86 | HR 89 | Ht 67.0 in | Wt 219.4 lb

## 2018-03-12 DIAGNOSIS — Z01810 Encounter for preprocedural cardiovascular examination: Secondary | ICD-10-CM | POA: Diagnosis not present

## 2018-03-12 DIAGNOSIS — I10 Essential (primary) hypertension: Secondary | ICD-10-CM

## 2018-03-12 NOTE — Patient Instructions (Addendum)
CLEARED FOR LEFT TOTAL HIP [facility]; DR OLIN-WE WILL NOTIFY OFFICE  Follow-Up: You will need a follow up appointment in 3 months WITH Laurie [name], MDKathryn Lyman Bishop, DNP, AACC or one of the following Advanced Practice Providers on your designated Care Team:  [name], DNP, AACC Theodore Demark, PA-C  Medication Instructions:  NO CHANGES- Your physician recommends that you continue on your current medications as directed. Please refer to the Current Medication list given to you today. If you need a refill on your cardiac medications before your next appointment, please call your pharmacy. Labwork: When you have labs (blood work) and your tests are completely normal, you will receive your results ONLY by MyChart Message (if you have MyChart) -OR- A paper copy in the mail.  At [facility], you and your health needs are our priority.  As part of our continuing mission to provide you with exceptional heart care, we have created designated Provider Care Teams.  These Care Teams include your primary Cardiologist (physician) and Advanced Practice Providers (APPs -  Physician Assistants and Nurse Practitioners) who all work together to provide you with the care you need, when you need it.  Thank you for choosing [facility] at [facility]!!

## 2018-03-12 NOTE — Progress Notes (Signed)
Cardiology Office Note   Date:  03/12/2018   ID:  Laurie [name], DOB [birth year], MRN [id number]  PCP:  Fleet Contras, MD  Cardiologist:  Herbie Baltimore   Chief Complaint  Patient presents with  . Pre-op Exam    Total Left Hip Replacement   . Hypertension  . Congestive Heart Failure    Diastolic     History of Present Illness: Laurie [name] is a 53 y.o. female who presents for ongoing assessment and management of dyspnea, chronic diastolic dysfunction., with HTN. She is here for pre-operative cardiology evaluation in anticipation of Total Left Hip Replacement with Dr Charlann Boxer on 04/02/2018.    She works in the ER as a Passenger transport manager. She has not been very active as she is in constant pain in her hip and legs from hip degradation and being on her feet during her long work days. She has not noticed increase in her dyspnea or chest pain. She is anxious to have the surgery so she can be more active.    Past Medical History:  Diagnosis Date  . Acne vulgaris 01/19/15  . Allergic rhinitis   . Anxiety 02/18/15  . Arthritis of knee   . Arthritis of right hip   . Borderline diabetes 11/14/11  . Eczematous dermatitis 11/08/10  . Edema of both legs   . Endometriosis   . Esophageal reflux   . Fibromyalgia   . Gout 02/08/12   idopathic gout, left ankle and foot  . Gynecomastia 11/18/13   hypertrophy of breast  . Menopause 11/14/11  . Obesity, unspecified 07/04/05  . Paresthesias/numbness 11/14/11  . Premature menopause 07/09/13  . Sinus tachycardia   . Spider veins of both lower extremities 04/12/15  . Varicose veins   . Varicose veins of bilateral lower extremities with other complications 04/26/15    Past Surgical History:  Procedure Laterality Date  . Cardiac Event Monitor  03/2015   [facility]: Sinus rhythm with occasional sinus tachycardia. Rates as high as 122. No arrhythmias seen.  Marland Kitchen JOINT REPLACEMENT Right 05/23/2006  . TOTAL HIP ARTHROPLASTY  05/23/2006  .  TRANSTHORACIC ECHOCARDIOGRAM  03/02/2015   [facility], Dr. Sherril Croon: Normal LV size, mild-moderate concentric LVH with mild diffuse HK. EF~50%. Mild LA dilation. Could not exclude small ASD/PFO due to atrial septal dropout. Mild TR, mild MR.     Current Outpatient Medications  Medication Sig Dispense Refill  . allopurinol (ZYLOPRIM) 100 MG tablet Take 100 mg by mouth daily.    . Biotin (BIOTIN 5000) 5 MG CAPS Take 2 capsules by mouth daily.    . calcium carbonate (OS-CAL) 600 MG TABS Take 600 mg by mouth daily. Reported on 08/10/2015    . folic acid (FOLVITE) 1 MG tablet Take 1 mg by mouth daily.    Marland Kitchen lubiprostone (AMITIZA) 24 MCG capsule Take 24 mcg by mouth 2 (two) times daily with a meal.    . metoprolol succinate (TOPROL-XL) 25 MG 24 hr tablet Take 1 tablet (25 mg total) by mouth daily. 90 tablet 3  . Multiple Vitamins-Minerals (MULTIVITAMINS THER. W/MINERALS) TABS Take 1 tablet by mouth daily.      . Vitamin D, Ergocalciferol, (DRISDOL) 50000 units CAPS capsule Take 50,000 Units by mouth 2 (two) times a week.     No current facility-administered medications for this visit.     Allergies:   Sulfa antibiotics and Sulfasalazine    Social History:  The patient  reports that she has never smoked. She has never used  smokeless tobacco. She reports that she does not drink alcohol or use drugs.   Family History:  The patient's family history includes Breast cancer in her maternal aunt; Cancer in her mother; Healthy in her father; Heart failure in her maternal aunt.    ROS: All other systems are reviewed and negative. Unless otherwise mentioned in H&P    PHYSICAL EXAM: VS:  BP 129/86 (BP Location: Left Arm, Patient Position: Sitting, Cuff Size: Normal)   Pulse 89   Ht 5\' 7"  (1.702 m)   Wt 219 lb 6.4 oz (99.5 kg)   LMP 07/14/2013   BMI 34.36 kg/m  , BMI Body mass index is 34.36 kg/m. GEN: Well nourished, well developed, in no acute distress HEENT: normal Neck: no JVD,  carotid bruits, or masses Cardiac: RRR; no murmurs, rubs, or gallops,no edema  Respiratory:  Clear to auscultation bilaterally, normal work of breathing GI: soft, nontender, nondistended, + BS MS: no deformity or atrophy Skin: warm and dry, no rash Neuro:  Strength and sensation are intact Psych: euthymic mood, full affect   EKG: NSR rate of 88 bpm.   Recent Labs: No results found for requested labs within last 8760 hours.    Lipid Panel No results found for: CHOL, TRIG, HDL, CHOLHDL, VLDL, LDLCALC, LDLDIRECT    Wt Readings from Last 3 Encounters:  03/12/18 219 lb 6.4 oz (99.5 kg)  01/19/17 226 lb (102.5 kg)  08/26/15 232 lb (105.2 kg)      Other studies Reviewed: Stress Echo 08/17/2015  Stress ECG conclusions: There were no stress arrhythmias or   conduction abnormalities. The stress ECG was negative for   ischemia. - Staged echo: There was no echocardiographic evidence for   stress-induced ischemia.  Impressions: Normal study after maximal exercise.  ASSESSMENT AND PLAN:  1.  Pre-Operative Cardiac Evaluation:    Chart reviewed as part of pre-operative protocol coverage. Given past medical history and time since last visit, based on ACC/AHA guidelines, Laurie [name] would be at acceptable risk for the planned procedure without further cardiovascular testing. There are no medications that need to be stopped or held during peri-operative period.   2. Hypertension: BP is well controlled. She will continue metoprolol 25 mg daily as directed. Continue during peri-operative period.   3. Chronic Diastolic Dysfunction: She does not have evidence of fluid overload. Continue lasix as directed.   Current medicines are reviewed at length with the patient today.    Labs/ tests ordered today include: None  Bettey Mare. Liborio Nixon, ANP, AACC   03/12/2018 4:47 PM    [facility] [address] Office [phone] Fax [phone]

## 2018-03-14 MED FILL — GABAPENTIN 300 MG CAPSULE: 300 | 30 days supply | Qty: 90 | Fill #1 | Status: TO

## 2018-03-14 MED FILL — FOLIC ACID 1 MG TABS: 1 | 30 days supply | Qty: 30 | Fill #2 | Status: TO

## 2018-03-14 MED FILL — ALLOPURINOL 100 MG TABS: 100 | 30 days supply | Qty: 30 | Fill #2 | Status: TO

## 2018-03-18 NOTE — Addendum Note (Signed)
Addended by: Alyson Ingles on: 03/18/2018 02:33 PM   Modules accepted: Orders

## 2018-03-21 ENCOUNTER — Other Ambulatory Visit (HOSPITAL_COMMUNITY): Payer: Self-pay | Admitting: *Deleted

## 2018-03-21 NOTE — Patient Instructions (Addendum)
Laurie [name]    Your procedure is scheduled on: 04-02-2018  Report to [facility] Main  Entrance  Report to admitting at  1155 am    Call this number if you have problems the morning of surgery [phone]    Remember: Do not eat food  :After Midnight. CLEAR LIQUIDS FROM MIDNIGHT UNTIL 800 AM, THEN NOTHING BY MOUTH AFTER 800 AM. BRUSH YOUR TEETH MORNING OF SURGERY AND RINSE YOUR MOUTH OUT, NO CHEWING GUM CANDY OR MINTS.     CLEAR LIQUID DIET   Foods Allowed                                                                     Foods Excluded  Coffee and tea, regular and decaf                             liquids that you cannot  Plain Jell-O in any flavor                                             see through such as: Fruit ices (not with fruit pulp)                                     milk, soups, orange juice  Iced Popsicles                                    All solid food Carbonated beverages, regular and diet                                    Cranberry, grape and apple juices Sports drinks like Gatorade Lightly seasoned clear broth or consume(fat free) Sugar, honey syrup  Sample Menu Breakfast                                Lunch                                     Supper Cranberry juice                    Beef broth                            Chicken broth Jell-O                                     Grape juice  Apple juice Coffee or tea                        Jell-O                                      Popsicle                                                Coffee or tea                        Coffee or tea  _____________________________________________________________________     Take these medicines the morning of surgery with A SIP OF WATER: METOPROLOL SUCCINATE, GABAPENTIN (NEURONTIN), ALLOPURINOL              You may not have any metal on your body including hair pins and              piercings  Do not wear  jewelry, make-up, lotions, powders or perfumes, deodorant             Do not wear nail polish.  Do not shave  48 hours prior to surgery.              Men may shave face and neck.   Do not bring valuables to the hospital. [facility] IS NOT             RESPONSIBLE   FOR VALUABLES.  Contacts, dentures or bridgework may not be worn into surgery.  Leave suitcase in the car. After surgery it may be brought to your room.     _____________________________________________________________________  [facility] - Preparing for Surgery Before surgery, you can play an important role.  Because skin is not sterile, your skin needs to be as free of germs as possible.  You can reduce the number of germs on your skin by washing with CHG (chlorahexidine gluconate) soap before surgery.  CHG is an antiseptic cleaner which kills germs and bonds with the skin to continue killing germs even after washing. Please DO NOT use if you have an allergy to CHG or antibacterial soaps.  If your skin becomes reddened/irritated stop using the CHG and inform your nurse when you arrive at Short Stay. Do not shave (including legs and underarms) for at least 48 hours prior to the first CHG shower.  You may shave your face/neck. Please follow these instructions carefully:  1.  Shower with CHG Soap the night before surgery and the  morning of Surgery.  2.  If you choose to wash your hair, wash your hair first as usual with your  normal  shampoo.  3.  After you shampoo, rinse your hair and body thoroughly to remove the  shampoo.                           4.  Use CHG as you would any other liquid soap.  You can apply chg directly  to the skin and wash                       Gently with a scrungie or clean washcloth.  5.  Apply the  CHG Soap to your body ONLY FROM THE NECK DOWN.   Do not use on face/ open                           Wound or open sores. Avoid contact with eyes, ears mouth and genitals (private parts).                        Wash face,  Genitals (private parts) with your normal soap.             6.  Wash thoroughly, paying special attention to the area where your surgery  will be performed.  7.  Thoroughly rinse your body with warm water from the neck down.  8.  DO NOT shower/wash with your normal soap after using and rinsing off  the CHG Soap.                9.  Pat yourself dry with a clean towel.            10.  Wear clean pajamas.            11.  Place clean sheets on your bed the night of your first shower and do not  sleep with pets. Day of Surgery : Do not apply any lotions/deodorants the morning of surgery.  Please wear clean clothes to the hospital/surgery center.  FAILURE TO FOLLOW THESE INSTRUCTIONS MAY RESULT IN THE CANCELLATION OF YOUR SURGERY PATIENT SIGNATURE_________________________________  NURSE SIGNATURE__________________________________  ________________________________________________________________________   Laurie [name]  An incentive spirometer is a tool that can help keep your lungs clear and active. This tool measures how well you are filling your lungs with each breath. Taking long deep breaths may help reverse or decrease the chance of developing breathing (pulmonary) problems (especially infection) following:  A long period of time when you are unable to move or be active. BEFORE THE PROCEDURE   If the spirometer includes an indicator to show your best effort, your nurse or respiratory therapist will set it to a desired goal.  If possible, sit up straight or lean slightly forward. Try not to slouch.  Hold the incentive spirometer in an upright position. INSTRUCTIONS FOR USE  1. Sit on the edge of your bed if possible, or sit up as far as you can in bed or on a chair. 2. Hold the incentive spirometer in an upright position. 3. Breathe out normally. 4. Place the mouthpiece in your mouth and seal your lips tightly around it. 5. Breathe in slowly and as deeply as  possible, raising the piston or the ball toward the top of the column. 6. Hold your breath for 3-5 seconds or for as long as possible. Allow the piston or ball to fall to the bottom of the column. 7. Remove the mouthpiece from your mouth and breathe out normally. 8. Rest for a few seconds and repeat Steps 1 through 7 at least 10 times every 1-2 hours when you are awake. Take your time and take a few normal breaths between deep breaths. 9. The spirometer may include an indicator to show your best effort. Use the indicator as a goal to work toward during each repetition. 10. After each set of 10 deep breaths, practice coughing to be sure your lungs are clear. If you have an incision (the cut made at the time of surgery), support your incision when coughing by placing a pillow or rolled  up towels firmly against it. Once you are able to get out of bed, walk around indoors and cough well. You may stop using the incentive spirometer when instructed by your caregiver.  RISKS AND COMPLICATIONS  Take your time so you do not get dizzy or light-headed.  If you are in pain, you may need to take or ask for pain medication before doing incentive spirometry. It is harder to take a deep breath if you are having pain. AFTER USE  Rest and breathe slowly and easily.  It can be helpful to keep track of a log of your progress. Your caregiver can provide you with a simple table to help with this. If you are using the spirometer at home, follow these instructions: SEEK MEDICAL CARE IF:   You are having difficultly using the spirometer.  You have trouble using the spirometer as often as instructed.  Your pain medication is not giving enough relief while using the spirometer.  You develop fever of 100.5 F (38.1 C) or higher. SEEK IMMEDIATE MEDICAL CARE IF:   You cough up bloody sputum that had not been present before.  You develop fever of 102 F (38.9 C) or greater.  You develop worsening pain at or near  the incision site. MAKE SURE YOU:   Understand these instructions.  Will watch your condition.  Will get help right away if you are not doing well or get worse. Document Released: 05/29/2006 Document Revised: 04/10/2011 Document Reviewed: 07/30/2006 Exit[employer] Patient Information 2014 [employer], [place].   ________________________________________________________________________  WHAT IS A BLOOD TRANSFUSION? Blood Transfusion Information  A transfusion is the replacement of blood or some of its parts. Blood is made up of multiple cells which provide different functions.  Red blood cells carry oxygen and are used for blood loss replacement.  White blood cells fight against infection.  Platelets control bleeding.  Plasma helps clot blood.  Other blood products are available for specialized needs, such as hemophilia or other clotting disorders. BEFORE THE TRANSFUSION  Who gives blood for transfusions?   Healthy volunteers who are fully evaluated to make sure their blood is safe. This is blood bank blood. Transfusion therapy is the safest it has ever been in the practice of medicine. Before blood is taken from a donor, a complete history is taken to make sure that person has no history of diseases nor engages in risky social behavior (examples are intravenous drug use or sexual activity with multiple partners). The donor's travel history is screened to minimize risk of transmitting infections, such as malaria. The donated blood is tested for signs of infectious diseases, such as HIV and hepatitis. The blood is then tested to be sure it is compatible with you in order to minimize the chance of a transfusion reaction. If you or a relative donates blood, this is often done in anticipation of surgery and is not appropriate for emergency situations. It takes many days to process the donated blood. RISKS AND COMPLICATIONS Although transfusion therapy is very safe and saves many lives, the main  dangers of transfusion include:   Getting an infectious disease.  Developing a transfusion reaction. This is an allergic reaction to something in the blood you were given. Every precaution is taken to prevent this. The decision to have a blood transfusion has been considered carefully by your caregiver before blood is given. Blood is not given unless the benefits outweigh the risks. AFTER THE TRANSFUSION  Right after receiving a blood transfusion, you will usually feel  much better and more energetic. This is especially true if your red blood cells have gotten low (anemic). The transfusion raises the level of the red blood cells which carry oxygen, and this usually causes an energy increase.  The nurse administering the transfusion will monitor you carefully for complications. HOME CARE INSTRUCTIONS  No special instructions are needed after a transfusion. You may find your energy is better. Speak with your caregiver about any limitations on activity for underlying diseases you may have. SEEK MEDICAL CARE IF:   Your condition is not improving after your transfusion.  You develop redness or irritation at the intravenous (IV) site. SEEK IMMEDIATE MEDICAL CARE IF:  Any of the following symptoms occur over the next 12 hours:  Shaking chills.  You have a temperature by mouth above 102 F (38.9 C), not controlled by medicine.  Chest, back, or muscle pain.  People around you feel you are not acting correctly or are confused.  Shortness of breath or difficulty breathing.  Dizziness and fainting.  You get a rash or develop hives.  You have a decrease in urine output.  Your urine turns a dark color or changes to pink, red, or brown. Any of the following symptoms occur over the next 10 days:  You have a temperature by mouth above 102 F (38.9 C), not controlled by medicine.  Shortness of breath.  Weakness after normal activity.  The white part of the eye turns yellow  (jaundice).  You have a decrease in the amount of urine or are urinating less often.  Your urine turns a dark color or changes to pink, red, or brown. Document Released: 01/14/2000 Document Revised: 04/10/2011 Document Reviewed: 09/02/2007 [facility] Patient Information 2014 [place], [place].  _______________________________________________________________________

## 2018-03-21 NOTE — Progress Notes (Signed)
NEED ORDERS FOR 3-3 SURGERY IN Epic . PRE OP IS 2-24

## 2018-03-25 ENCOUNTER — Encounter (HOSPITAL_COMMUNITY)
Admission: RE | Admit: 2018-03-25 | Discharge: 2018-03-25 | Disposition: A | Discharge status: Home / Self Care | Payer: BLUE CROSS/BLUE SHIELD | Source: Ambulatory Visit | Attending: Orthopedic Surgery | Admitting: Orthopedic Surgery

## 2018-03-25 ENCOUNTER — Encounter (HOSPITAL_COMMUNITY): Payer: Self-pay

## 2018-03-25 DIAGNOSIS — M1612 Unilateral primary osteoarthritis, left hip: Secondary | ICD-10-CM | POA: Insufficient documentation

## 2018-03-25 DIAGNOSIS — Z01812 Encounter for preprocedural laboratory examination: Secondary | ICD-10-CM | POA: Insufficient documentation

## 2018-03-25 HISTORY — DX: Prediabetes: R73.03

## 2018-03-25 HISTORY — DX: Anesthesia of skin: R20.0

## 2018-03-25 LAB — BASIC METABOLIC PANEL
Anion gap: 6 (ref 5–15)
BUN: 21 mg/dL — ABNORMAL HIGH (ref 6–20)
CO2: 25 mmol/L (ref 22–32)
Calcium: 8.7 mg/dL — ABNORMAL LOW (ref 8.9–10.3)
Chloride: 110 mmol/L (ref 98–111)
Creatinine, Ser: 0.83 mg/dL (ref 0.44–1.00)
GFR calc Af Amer: >60 mL/min (ref >60)
GFR calc non Af Amer: >60 mL/min (ref >60)
Glucose, Bld: 102 mg/dL — ABNORMAL HIGH (ref 70–99)
Potassium: 3.4 mmol/L — ABNORMAL LOW (ref 3.5–5.1)
Sodium: 141 mmol/L (ref 135–145)

## 2018-03-25 LAB — CBC
HCT: 39 % (ref 36.0–46.0)
Hemoglobin: 12 g/dL (ref 12.0–15.0)
MCH: 26 pg (ref 26.0–34.0)
MCHC: 30.8 g/dL (ref 30.0–36.0)
MCV: 84.6 fL (ref 80.0–100.0)
Platelets: 253 10*3/uL (ref 150–400)
RBC: 4.61 MIL/uL (ref 3.87–5.11)
RDW: 14.6 % (ref 11.5–15.5)
WBC: 10.5 10*3/uL (ref 4.0–10.5)
nRBC: 0 % (ref 0.0–0.2)

## 2018-03-25 LAB — HEMOGLOBIN A1C
Hgb A1c MFr Bld: 5.8 % — ABNORMAL HIGH (ref 4.8–5.6)
Mean Plasma Glucose: 119.76 mg/dL

## 2018-03-25 LAB — SURGICAL PCR SCREEN
MRSA, PCR: NEGATIVE
Staphylococcus aureus: NEGATIVE

## 2018-03-26 DIAGNOSIS — K5904 Chronic idiopathic constipation: Secondary | ICD-10-CM | POA: Diagnosis not present

## 2018-03-26 DIAGNOSIS — M169 Osteoarthritis of hip, unspecified: Secondary | ICD-10-CM | POA: Diagnosis not present

## 2018-03-26 DIAGNOSIS — R7303 Prediabetes: Secondary | ICD-10-CM | POA: Diagnosis not present

## 2018-03-26 DIAGNOSIS — M609 Myositis, unspecified: Secondary | ICD-10-CM | POA: Diagnosis not present

## 2018-03-27 NOTE — Progress Notes (Signed)
Anesthesia Chart Review   Case:  [id number] Date/Time:  04/02/18 1405   Procedure:  TOTAL HIP ARTHROPLASTY ANTERIOR APPROACH (Left )   Anesthesia type:  Spinal   Pre-op diagnosis:  left hip osteoarthritis   Location:  [facility] ROOM 10 / [facility]S   Surgeon:  Durene Romans, MD      DISCUSSION: 53 yo never smoker with h/o fibromyalgia, anxiety, HTN, chronic diastolic dysfunction, left hip OA scheduled for above procedure 04/02/18 with Dr. Durene Romans.   Pt last seen by cardiology 03/12/18.  Seen by [name], NP.  At this visit BP well controlled, chronic diastolic dysfunction stable.  Per OV note, "Given past medical history and time since last visit, based on ACC/AHA guidelines, Oveta Arboleda Pennebaker would be at acceptable risk for the planned procedure without further cardiovascular testing. There are no medications that need to be stopped or held during peri-operative period."  Pt can proceed with planned procedure barring acute status change.  VS: BP (!) 151/89   Pulse 98   Temp 36.7 C (Oral)   Ht 5\' 7"  (1.702 m)   Wt 100.2 kg   LMP 07/14/2013   SpO2 100%   BMI 34.61 kg/m   PROVIDERS: Fleet Contras, MD is PCP   Bryan Lemma, MD is Cardiologist  LABS: Labs reviewed: Acceptable for surgery. (all labs ordered are listed, but only abnormal results are displayed)  Labs Reviewed  BASIC METABOLIC PANEL - Abnormal; Notable for the following components:      Result Value   Potassium 3.4 (*)    Glucose, Bld 102 (*)    BUN 21 (*)    Calcium 8.7 (*)    All other components within normal limits  HEMOGLOBIN A1C - Abnormal; Notable for the following components:   Hgb A1c MFr Bld 5.8 (*)    All other components within normal limits  SURGICAL PCR SCREEN  CBC  TYPE AND SCREEN     IMAGES:   EKG: 03/12/2018 Rate 88 bpm Normal sinus rhythm  Normal ECG  CV: Echo 03/02/2015 Conclusion:  1. Left ventricle cavity is normal in size.  Mild to moderate concentric hypertrophy of the  left ventricle. Mild diffuse hypokinesis.  Low normal LVEF.  Calculated EF 50%.  2. Left atrial cavity is mildly dilated.  Septal dropout, cannot exclude presence of a small ASD/PFO by color flow Doppler.  3. Trace to mild mitral regurgitation.  4. Mild tricuspid regurgitation.  Unable to estimate PA pressure due to poor TR signal.   5. Consider TEE if clinically indicated.  Past Medical History:  Diagnosis Date  . Acne vulgaris 01/19/15  . Allergic rhinitis   . Anxiety    pt denies  . Arthritis of knee   . Arthritis of right hip   . Borderline diabetes 11/14/11  . Eczematous dermatitis 11/08/2010   eczema  . Edema of both legs   . Endometriosis   . Esophageal reflux   . Fibromyalgia   . Gout 02/08/12   idopathic gout, left ankle and foot  . Gout   . Gynecomastia 11/18/13   hypertrophy of breast  . Menopause 11/14/11  . Numbness in both hands   . Obesity, unspecified 07/04/05  . Paresthesias/numbness 11/14/11  . Pre-diabetes   . Premature menopause 07/09/13  . Sinus tachycardia   . Sinus tachycardia   . Spider veins of both lower extremities 04/12/15  . Varicose veins   . Varicose veins of bilateral lower extremities with other complications 04/26/15  Past Surgical History:  Procedure Laterality Date  . Cardiac Event Monitor  03/2015   [facility]: Sinus rhythm with occasional sinus tachycardia. Rates as high as 122. No arrhythmias seen.  Marland Kitchen JOINT REPLACEMENT Right 05/23/2006  . LIPOSUCTION  2014  . TOTAL HIP ARTHROPLASTY Right 05/23/2006  . TRANSTHORACIC ECHOCARDIOGRAM  03/02/2015   [facility], Dr. Sherril Croon: Normal LV size, mild-moderate concentric LVH with mild diffuse HK. EF~50%. Mild LA dilation. Could not exclude small ASD/PFO due to atrial septal dropout. Mild TR, mild MR.    MEDICATIONS: . allopurinol (ZYLOPRIM) 100 MG tablet  . Biotin (BIOTIN 5000) 5 MG CAPS  . calcium-vitamin D (OSCAL WITH D) 500-200 MG-UNIT tablet  . fluticasone  (FLONASE) 50 MCG/ACT nasal spray  . folic acid (FOLVITE) 1 MG tablet  . gabapentin (NEURONTIN) 300 MG capsule  . lubiprostone (AMITIZA) 24 MCG capsule  . metoprolol succinate (TOPROL-XL) 25 MG 24 hr tablet  . Multiple Vitamins-Minerals (MULTIVITAMINS THER. W/MINERALS) TABS  . naproxen (NAPROSYN) 500 MG tablet   No current facility-administered medications for this encounter.     Janey Genta [facility] Pre-Surgical Testing [phone] 03/27/18 2:51 PM

## 2018-03-27 NOTE — Anesthesia Preprocedure Evaluation (Addendum)
Anesthesia Evaluation  Patient identified by MRN, date of birth, ID band Patient awake    Reviewed: Allergy & Precautions, NPO status , Patient's Chart, lab work & pertinent test results  Airway Mallampati: II  TM Distance: >3 FB Neck ROM: Full    Dental no notable dental hx. (+) Edentulous Upper, Edentulous Lower   Pulmonary shortness of breath,    Pulmonary exam normal breath sounds clear to auscultation       Cardiovascular Exercise Tolerance: Good hypertension, Normal cardiovascular exam Rhythm:Regular Rate:Normal  Stress echo 08/18/15  Stress echo results:     Left ventricular ejection fraction was normal at rest and with stress. There was no echocardiographic evidence for stress-induced ischemia.   Neuro/Psych Anxiety  Neuromuscular disease negative psych ROS   GI/Hepatic Neg liver ROS, GERD  Medicated,  Endo/Other    Renal/GU Cr.083     Musculoskeletal  (+) Arthritis , Fibromyalgia -  Abdominal (+) + obese,   Peds  Hematology Hgb 12.0 Plts 253   Anesthesia Other Findings   Reproductive/Obstetrics                             Anesthesia Physical Anesthesia Plan  ASA: II  Anesthesia Plan: General   Post-op Pain Management:    Induction:   PONV Risk Score and Plan: 2 and Treatment may vary due to age or medical condition, Ondansetron and Dexamethasone  Airway Management Planned: Oral ETT  Additional Equipment:   Intra-op Plan:   Post-operative Plan:   Informed Consent: I have reviewed the patients History and Physical, chart, labs and discussed the procedure including the risks, benefits and alternatives for the proposed anesthesia with the patient or authorized representative who has indicated his/her understanding and acceptance.     Dental advisory given  Plan Discussed with:   Anesthesia Plan Comments: (See PAT visit 03/25/18, Jodell Cipro, PA-C  L THR under  spinal  Switch to GA)      Anesthesia Quick Evaluation

## 2018-03-29 ENCOUNTER — Encounter (HOSPITAL_COMMUNITY): Payer: Self-pay | Admitting: Student

## 2018-03-29 NOTE — H&P (Signed)
TOTAL HIP ADMISSION H&P  Patient is admitted for left total hip arthroplasty.  Subjective:  Chief Complaint: left hip pain  HPI: Laurie [name], 53 y.o. female, has a history of pain and functional disability in the left hip(s) due to arthritis and patient has failed non-surgical conservative treatments for greater than 12 weeks to include activity modification.  Onset of symptoms was gradual starting 2 years ago with gradually worsening course since that time.The patient noted no past surgery on the left hip(s).  Patient currently rates pain in the left hip at 10 out of 10 with activity. Patient has worsening of pain with activity and weight bearing and pain that interfers with activities of daily living. Patient has evidence of significant degenerative changes of the left hip. by imaging studies. There is no current active infection.  Patient Active Problem List   Diagnosis Date Noted  . Primary osteoarthritis of both hands 02/16/2017  . Primary osteoarthritis of both feet 02/16/2017  . Primary osteoarthritis of both knees 02/16/2017  . 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 Unilateral primary osteoarthritis, left hip 02/16/2017  . Polyarthralgia 09/07/2016  . Other fatigue 09/07/2016  . Other insomnia 09/07/2016  . Gout of left foot 09/07/2016  . ANA positive 09/07/2016  . Chest pain with minimal risk for cardiac etiology 08/28/2015  . Intermittent palpitations 08/28/2015  . Allergic rhinitis   . Endometriosis   . Arthritis of right hip   . Fluid overload   . Fibromyalgia   . Sinus tachycardia   . Obesity, unspecified   . Eczematous dermatitis   . Paresthesias/numbness   . Esophageal reflux   . Edema of both legs   . Shortness of breath 06/21/2015  . Varicose veins of bilateral lower extremities with other complications 04/26/2015  . Spider veins of both lower extremities 04/12/2015  . Gynecomastia 11/18/2013  . Premature menopause 07/09/2013  . Gout  02/08/2012  . Menopause 11/14/2011   Past Medical History:  Diagnosis Date  . Acne vulgaris 01/19/15  . Allergic rhinitis   . Anxiety    pt denies  . Arthritis of knee   . Arthritis of right hip   . Borderline diabetes 11/14/11  . Eczematous dermatitis 11/08/2010   eczema  . Edema of both legs   . Endometriosis   . Esophageal reflux   . Fibromyalgia   . Gout 02/08/12   idopathic gout, left ankle and foot  . Gout   . Gynecomastia 11/18/13   hypertrophy of breast  . Menopause 11/14/11  . Numbness in both hands   . Obesity, unspecified 07/04/05  . Paresthesias/numbness 11/14/11  . Pre-diabetes   . Premature menopause 07/09/13  . Sinus tachycardia   . Sinus tachycardia   . Spider veins of both lower extremities 04/12/15  . Varicose veins   . Varicose veins of bilateral lower extremities with other complications 04/26/15    Past Surgical History:  Procedure Laterality Date  . Cardiac Event Monitor  03/2015   [facility]: Sinus rhythm with occasional sinus tachycardia. Rates as high as 122. No arrhythmias seen.  Marland Kitchen JOINT REPLACEMENT Right 05/23/2006  . LIPOSUCTION  2014  . TOTAL HIP ARTHROPLASTY Right 05/23/2006  . TRANSTHORACIC ECHOCARDIOGRAM  03/02/2015   [facility], Dr. Sherril Croon: Normal LV size, mild-moderate concentric LVH with mild diffuse HK. EF~50%. Mild LA dilation. Could not exclude small ASD/PFO due to atrial septal dropout. Mild TR, mild MR.    No current facility-administered medications for this encounter.  Current Outpatient Medications  Medication Sig Dispense Refill Last Dose  . allopurinol (ZYLOPRIM) 100 MG tablet Take 100 mg by mouth daily.   Taking  . Biotin (BIOTIN 5000) 5 MG CAPS Take 5 mg by mouth daily.    Not Taking  . calcium-vitamin D (OSCAL WITH D) 500-200 MG-UNIT tablet Take 1 tablet by mouth daily with breakfast.     . fluticasone (FLONASE) 50 MCG/ACT nasal spray Place 2 sprays into both nostrils daily as needed for  allergies.     . folic acid (FOLVITE) 1 MG tablet Take 1 mg by mouth daily.   Taking  . gabapentin (NEURONTIN) 300 MG capsule Take 300 mg by mouth 2 (two) times daily.     Marland Kitchen lubiprostone (AMITIZA) 24 MCG capsule Take 24 mcg by mouth daily as needed for constipation.    Not Taking  . metoprolol succinate (TOPROL-XL) 25 MG 24 hr tablet Take 1 tablet (25 mg total) by mouth daily. 90 tablet 3 Taking  . Multiple Vitamins-Minerals (MULTIVITAMINS THER. W/MINERALS) TABS Take 1 tablet by mouth daily.     Taking  . naproxen (NAPROSYN) 500 MG tablet Take 500 mg by mouth 2 (two) times daily with a meal.      Allergies  Allergen Reactions  . Sulfa Antibiotics Swelling  . Sulfasalazine Swelling    Social History   Tobacco Use  . Smoking status: Never Smoker  . Smokeless tobacco: Never Used  Substance Use Topics  . Alcohol use: No    Alcohol/week: 0.0 standard drinks    Family History  Problem Relation Age of Onset  . Healthy Father   . Cancer Mother        leukemia   . Heart failure Maternal Aunt   . Breast cancer Maternal Aunt      ROS Constitutional: Constitutional: no fever, chills, night sweats, or significant weight loss.  Cardiovascular: Cardiovascular: no palpitations or chest pain.  Respiratory: Respiratory: no cough or shortness of breath and No COPD.  Gastrointestinal: Gastrointestinal: no vomiting or nausea.  Musculoskeletal: Musculoskeletal: no swelling in Joints and Joint Pain.  Neurologic: Neurologic: no numbness, tingling, or difficulty with balance  Objective:  Physical Exam Patient is a 53 year old female.  Well nourished and well developed.  General: Alert and oriented x3, cooperative and pleasant, no acute distress.  Head: normocephalic, atraumatic, neck supple.  Eyes: EOMI.  Respiratory: breath sounds clear in all fields, no wheezing, rales, or rhonchi.  Cardiovascular: Regular rate and rhythm, no murmurs, gallops or rubs.  Abdomen: non-tender to  palpation and soft, normoactive bowel sounds.  Musculoskeletal: RIGHT HIP EXAM: History of right total hip arthroplasty with ASR components doing well Inspection: Incision site intact and no signs of infection Neurovascular: Sensation intact to light touch distally and brisk capillary refill Range of Motion: Flexion: Full Internal rotation: Full External rotation: Full  LEFT HIP EXAM Inspection: Skin intact, no rashes and no gross deformity Neurovascular: Sensation intact to light touch distally and brisk capillary refill Range of Motion: Flexion: Limited with discomfort Internal rotation: Limited to 5 with discomfort External rotation: Limited to 20 with discomfort  Calves soft and nontender. Motor function intact in LE. Strength 5/5 LE bilaterally.  Neuro: Distal pulses 2+. Sensation to light touch intact in LE.  Vital signs in last 24 hours:    Labs:   Estimated body mass index is 34.61 kg/m as calculated from the following:   Height as of 03/25/18:  (1.702 m).   Weight as of 03/25/18:  100.2 kg.   Imaging Review Plain radiographs demonstrate severe degenerative joint disease of the left hip(s). The bone quality appears to be adequate for age and reported activity level.      Assessment/Plan:  End stage arthritis, left hip(s)  The patient history, physical examination, clinical judgement of the provider and imaging studies are consistent with end stage degenerative joint disease of the left hip(s) and total hip arthroplasty is deemed medically necessary. The treatment options including medical management, injection therapy, arthroscopy and arthroplasty were discussed at length. The risks and benefits of total hip arthroplasty were presented and reviewed. The risks due to aseptic loosening, infection, stiffness, dislocation/subluxation,  thromboembolic complications and other imponderables were discussed.  The patient acknowledged the explanation, agreed to proceed  with the plan and consent was signed. Patient is being admitted for inpatient treatment for surgery, pain control, PT, OT, prophylactic antibiotics, VTE prophylaxis, progressive ambulation and ADL's and discharge planning.The patient is planning to be discharged home.   Therapy Plans: HEP vs HHPT Disposition: Home with husband Planned DVT Prophylaxis: aspirin 81mg  BID DME needed: 3-n-1, walker PCP: Dr. Sherilyn Banker  TXA: IV Allergies: sulfa drugs - hives Anesthesia Concerns: none BMI: 31.3  Last HgbA1c: 6.2%  Other: Norco   - Patient was instructed on what medications to stop prior to surgery. - Follow-up visit in 2 weeks with Dr. Charlann Boxer - Begin physical therapy following surgery - Pre-operative lab work as pre-surgical testing - Prescriptions will be provided in hospital at time of discharge  Dennie Bible, PA-C Orthopedic Surgery [facility] [place]

## 2018-04-02 ENCOUNTER — Inpatient Hospital Stay (HOSPITAL_COMMUNITY): Payer: BLUE CROSS/BLUE SHIELD

## 2018-04-02 ENCOUNTER — Telehealth (HOSPITAL_COMMUNITY): Payer: Self-pay | Admitting: *Deleted

## 2018-04-02 ENCOUNTER — Encounter (HOSPITAL_COMMUNITY): Payer: Self-pay | Admitting: Certified Registered Nurse Anesthetist

## 2018-04-02 ENCOUNTER — Inpatient Hospital Stay (HOSPITAL_COMMUNITY): Payer: BLUE CROSS/BLUE SHIELD | Admitting: Certified Registered Nurse Anesthetist

## 2018-04-02 ENCOUNTER — Inpatient Hospital Stay (HOSPITAL_COMMUNITY): Payer: BLUE CROSS/BLUE SHIELD | Admitting: Physician Assistant

## 2018-04-02 ENCOUNTER — Encounter (HOSPITAL_COMMUNITY)
Admission: RE | Disposition: A | Discharge status: Home / Self Care | Payer: Self-pay | Source: Home / Self Care | Attending: Orthopedic Surgery

## 2018-04-02 ENCOUNTER — Inpatient Hospital Stay (HOSPITAL_COMMUNITY)
Admission: RE | Admit: 2018-04-02 | Discharge: 2018-04-04 | DRG: 470 | Disposition: A | Discharge status: Home / Self Care | Payer: BLUE CROSS/BLUE SHIELD | Attending: Orthopedic Surgery | Admitting: Orthopedic Surgery

## 2018-04-02 ENCOUNTER — Other Ambulatory Visit: Payer: Self-pay

## 2018-04-02 DIAGNOSIS — Z79899 Other long term (current) drug therapy: Secondary | ICD-10-CM | POA: Diagnosis not present

## 2018-04-02 DIAGNOSIS — M1612 Unilateral primary osteoarthritis, left hip: Principal | ICD-10-CM | POA: Diagnosis present

## 2018-04-02 DIAGNOSIS — Z791 Long term (current) use of non-steroidal anti-inflammatories (NSAID): Secondary | ICD-10-CM

## 2018-04-02 DIAGNOSIS — E669 Obesity, unspecified: Secondary | ICD-10-CM | POA: Diagnosis not present

## 2018-04-02 DIAGNOSIS — M25752 Osteophyte, left hip: Secondary | ICD-10-CM | POA: Diagnosis not present

## 2018-04-02 DIAGNOSIS — I10 Essential (primary) hypertension: Secondary | ICD-10-CM | POA: Diagnosis not present

## 2018-04-02 DIAGNOSIS — Z419 Encounter for procedure for purposes other than remedying health state, unspecified: Secondary | ICD-10-CM

## 2018-04-02 DIAGNOSIS — M797 Fibromyalgia: Secondary | ICD-10-CM | POA: Diagnosis present

## 2018-04-02 DIAGNOSIS — M19042 Primary osteoarthritis, left hand: Secondary | ICD-10-CM | POA: Diagnosis not present

## 2018-04-02 DIAGNOSIS — K219 Gastro-esophageal reflux disease without esophagitis: Secondary | ICD-10-CM | POA: Diagnosis present

## 2018-04-02 DIAGNOSIS — M19071 Primary osteoarthritis, right ankle and foot: Secondary | ICD-10-CM | POA: Diagnosis present

## 2018-04-02 DIAGNOSIS — Z96649 Presence of unspecified artificial hip joint: Secondary | ICD-10-CM

## 2018-04-02 DIAGNOSIS — Z7951 Long term (current) use of inhaled steroids: Secondary | ICD-10-CM

## 2018-04-02 DIAGNOSIS — J309 Allergic rhinitis, unspecified: Secondary | ICD-10-CM | POA: Diagnosis not present

## 2018-04-02 DIAGNOSIS — M10072 Idiopathic gout, left ankle and foot: Secondary | ICD-10-CM | POA: Diagnosis not present

## 2018-04-02 DIAGNOSIS — Z882 Allergy status to sulfonamides status: Secondary | ICD-10-CM

## 2018-04-02 DIAGNOSIS — M19072 Primary osteoarthritis, left ankle and foot: Secondary | ICD-10-CM | POA: Diagnosis not present

## 2018-04-02 DIAGNOSIS — M19041 Primary osteoarthritis, right hand: Secondary | ICD-10-CM | POA: Diagnosis present

## 2018-04-02 DIAGNOSIS — Z96642 Presence of left artificial hip joint: Secondary | ICD-10-CM | POA: Diagnosis not present

## 2018-04-02 DIAGNOSIS — M170 Bilateral primary osteoarthritis of knee: Secondary | ICD-10-CM | POA: Diagnosis not present

## 2018-04-02 DIAGNOSIS — Z471 Aftercare following joint replacement surgery: Secondary | ICD-10-CM | POA: Diagnosis not present

## 2018-04-02 DIAGNOSIS — Z96641 Presence of right artificial hip joint: Secondary | ICD-10-CM | POA: Diagnosis not present

## 2018-04-02 DIAGNOSIS — M25852 Other specified joint disorders, left hip: Secondary | ICD-10-CM | POA: Diagnosis not present

## 2018-04-02 DIAGNOSIS — Z6834 Body mass index (BMI) 34.0-34.9, adult: Secondary | ICD-10-CM

## 2018-04-02 DIAGNOSIS — Z96643 Presence of artificial hip joint, bilateral: Secondary | ICD-10-CM | POA: Diagnosis not present

## 2018-04-02 HISTORY — PX: TOTAL HIP ARTHROPLASTY: SHX124

## 2018-04-02 LAB — TYPE AND SCREEN
ABO/RH(D): O POS
Antibody Screen: NEGATIVE

## 2018-04-02 SURGERY — ARTHROPLASTY, HIP, TOTAL, ANTERIOR APPROACH
Anesthesia: Spinal | Laterality: Left

## 2018-04-02 MED ORDER — HYDROCODONE-ACETAMINOPHEN 5-325 MG PO TABS
1.0000 | ORAL_TABLET | ORAL | Status: DC | PRN
  Administered 2018-04-03 – 2018-04-04 (×3): 2 via ORAL
  Filled 2018-04-02 (×3): qty 2

## 2018-04-02 MED ORDER — ONDANSETRON HCL 4 MG PO TABS: 4.0000 mg | ORAL_TABLET | Freq: Four times a day (QID) | ORAL | Status: DC | PRN

## 2018-04-02 MED ORDER — DEXAMETHASONE SODIUM PHOSPHATE 10 MG/ML IJ SOLN
10.0000 mg | Freq: Once | INTRAMUSCULAR | Status: AC
  Administered 2018-04-03: 10 mg via INTRAVENOUS
  Filled 2018-04-02: qty 1

## 2018-04-02 MED ORDER — HYDROCODONE-ACETAMINOPHEN 7.5-325 MG PO TABS: 1.0000 | ORAL_TABLET | Freq: Once | ORAL | Status: DC | PRN

## 2018-04-02 MED ORDER — ONDANSETRON HCL 4 MG/2ML IJ SOLN: 4.0000 mg | Freq: Four times a day (QID) | INTRAMUSCULAR | Status: DC | PRN

## 2018-04-02 MED ORDER — MENTHOL 3 MG MT LOZG: 1.0000 | LOZENGE | OROMUCOSAL | Status: DC | PRN

## 2018-04-02 MED ORDER — FENTANYL CITRATE (PF) 100 MCG/2ML IJ SOLN: 50.0000 ug | Freq: Once | INTRAMUSCULAR | Status: DC

## 2018-04-02 MED ORDER — DOCUSATE SODIUM 100 MG PO CAPS
100.0000 mg | ORAL_CAPSULE | Freq: Two times a day (BID) | ORAL | Status: DC
  Administered 2018-04-02 – 2018-04-04 (×4): 100 mg via ORAL
  Filled 2018-04-02 (×4): qty 1

## 2018-04-02 MED ORDER — BUPIVACAINE IN DEXTROSE 0.75-8.25 % IT SOLN: INTRATHECAL | Status: DC | PRN

## 2018-04-02 MED ORDER — PROPOFOL 10 MG/ML IV BOLUS
INTRAVENOUS | Status: DC | PRN
  Administered 2018-04-02: 150 mg via INTRAVENOUS

## 2018-04-02 MED ORDER — MEPERIDINE HCL 50 MG/ML IJ SOLN: 6.2500 mg | INTRAMUSCULAR | Status: DC | PRN

## 2018-04-02 MED ORDER — LACTATED RINGERS IV SOLN
INTRAVENOUS | Status: DC | PRN
  Administered 2018-04-02: 12:00:00 via INTRAVENOUS

## 2018-04-02 MED ORDER — FENTANYL CITRATE (PF) 100 MCG/2ML IJ SOLN
INTRAMUSCULAR | Status: AC
  Filled 2018-04-02: qty 2

## 2018-04-02 MED ORDER — 0.9 % SODIUM CHLORIDE (POUR BTL) OPTIME
TOPICAL | Status: DC | PRN
  Administered 2018-04-02: 1000 mL

## 2018-04-02 MED ORDER — HYDROMORPHONE HCL 1 MG/ML IJ SOLN
0.5000 mg | INTRAMUSCULAR | Status: DC | PRN
  Administered 2018-04-02: 0.5 mg via INTRAVENOUS
  Administered 2018-04-02 – 2018-04-03 (×3): 1 mg via INTRAVENOUS
  Filled 2018-04-02 (×4): qty 1

## 2018-04-02 MED ORDER — LACTATED RINGERS IV SOLN
INTRAVENOUS | Status: DC
  Administered 2018-04-02 (×3): via INTRAVENOUS

## 2018-04-02 MED ORDER — ASPIRIN 81 MG PO CHEW
81.0000 mg | CHEWABLE_TABLET | Freq: Two times a day (BID) | ORAL | Status: DC
  Administered 2018-04-02 – 2018-04-04 (×4): 81 mg via ORAL
  Filled 2018-04-02 (×4): qty 1

## 2018-04-02 MED ORDER — FENTANYL CITRATE (PF) 100 MCG/2ML IJ SOLN
INTRAMUSCULAR | Status: DC | PRN
  Administered 2018-04-02 (×2): 50 ug via INTRAVENOUS

## 2018-04-02 MED ORDER — ACETAMINOPHEN 325 MG PO TABS
325.0000 mg | ORAL_TABLET | Freq: Four times a day (QID) | ORAL | Status: DC | PRN
  Administered 2018-04-03: 650 mg via ORAL
  Filled 2018-04-02: qty 2

## 2018-04-02 MED ORDER — ROCURONIUM BROMIDE 100 MG/10ML IV SOLN
INTRAVENOUS | Status: DC | PRN
  Administered 2018-04-02: 40 mg via INTRAVENOUS

## 2018-04-02 MED ORDER — ALLOPURINOL 100 MG PO TABS
100.0000 mg | ORAL_TABLET | Freq: Every day | ORAL | Status: DC
  Administered 2018-04-03 – 2018-04-04 (×2): 100 mg via ORAL
  Filled 2018-04-02 (×2): qty 1

## 2018-04-02 MED ORDER — PROPOFOL 10 MG/ML IV BOLUS
INTRAVENOUS | Status: AC
  Filled 2018-04-02: qty 60

## 2018-04-02 MED ORDER — SUGAMMADEX SODIUM 200 MG/2ML IV SOLN
INTRAVENOUS | Status: DC | PRN
  Administered 2018-04-02: 200 mg via INTRAVENOUS

## 2018-04-02 MED ORDER — MAGNESIUM CITRATE PO SOLN: 1.0000 | Freq: Once | ORAL | Status: DC | PRN

## 2018-04-02 MED ORDER — PROMETHAZINE HCL 25 MG/ML IJ SOLN: 6.2500 mg | INTRAMUSCULAR | Status: DC | PRN

## 2018-04-02 MED ORDER — PHENOL 1.4 % MT LIQD
1.0000 | OROMUCOSAL | Status: DC | PRN
  Filled 2018-04-02: qty 177

## 2018-04-02 MED ORDER — MIDAZOLAM HCL 5 MG/5ML IJ SOLN
INTRAMUSCULAR | Status: DC | PRN
  Administered 2018-04-02 (×2): 1 mg via INTRAVENOUS

## 2018-04-02 MED ORDER — CHLORHEXIDINE GLUCONATE 4 % EX LIQD: 60.0000 mL | Freq: Once | CUTANEOUS | Status: DC

## 2018-04-02 MED ORDER — HYDROCODONE-ACETAMINOPHEN 7.5-325 MG PO TABS
1.0000 | ORAL_TABLET | ORAL | Status: DC | PRN
  Administered 2018-04-02: 1 via ORAL
  Administered 2018-04-03 – 2018-04-04 (×3): 2 via ORAL
  Filled 2018-04-02 (×2): qty 2
  Filled 2018-04-02: qty 1
  Filled 2018-04-02: qty 2

## 2018-04-02 MED ORDER — METHOCARBAMOL 500 MG PO TABS
500.0000 mg | ORAL_TABLET | Freq: Four times a day (QID) | ORAL | Status: DC | PRN
  Administered 2018-04-03 – 2018-04-04 (×5): 500 mg via ORAL
  Filled 2018-04-02 (×5): qty 1

## 2018-04-02 MED ORDER — TRANEXAMIC ACID-NACL 1000-0.7 MG/100ML-% IV SOLN
1000.0000 mg | Freq: Once | INTRAVENOUS | Status: AC
  Administered 2018-04-02: 1000 mg via INTRAVENOUS
  Filled 2018-04-02: qty 100

## 2018-04-02 MED ORDER — DIPHENHYDRAMINE HCL 12.5 MG/5ML PO ELIX: 12.5000 mg | ORAL_SOLUTION | ORAL | Status: DC | PRN

## 2018-04-02 MED ORDER — METOCLOPRAMIDE HCL 5 MG PO TABS: 5.0000 mg | ORAL_TABLET | Freq: Three times a day (TID) | ORAL | Status: DC | PRN

## 2018-04-02 MED ORDER — GABAPENTIN 300 MG PO CAPS
300.0000 mg | ORAL_CAPSULE | Freq: Two times a day (BID) | ORAL | Status: DC
  Administered 2018-04-02 – 2018-04-04 (×4): 300 mg via ORAL
  Filled 2018-04-02 (×4): qty 1

## 2018-04-02 MED ORDER — TRANEXAMIC ACID-NACL 1000-0.7 MG/100ML-% IV SOLN
1000.0000 mg | INTRAVENOUS | Status: AC
  Administered 2018-04-02: 1000 mg via INTRAVENOUS
  Filled 2018-04-02: qty 100

## 2018-04-02 MED ORDER — FOLIC ACID 1 MG PO TABS
1.0000 mg | ORAL_TABLET | Freq: Every day | ORAL | Status: DC
  Administered 2018-04-03 – 2018-04-04 (×2): 1 mg via ORAL
  Filled 2018-04-02 (×2): qty 1

## 2018-04-02 MED ORDER — MIDAZOLAM HCL 2 MG/2ML IJ SOLN: 1.0000 mg | Freq: Once | INTRAMUSCULAR | Status: DC

## 2018-04-02 MED ORDER — CEFAZOLIN SODIUM-DEXTROSE 2-4 GM/100ML-% IV SOLN
2.0000 g | Freq: Four times a day (QID) | INTRAVENOUS | Status: AC
  Administered 2018-04-02 – 2018-04-03 (×2): 2 g via INTRAVENOUS
  Filled 2018-04-02 (×2): qty 100

## 2018-04-02 MED ORDER — SUCCINYLCHOLINE CHLORIDE 20 MG/ML IJ SOLN
INTRAMUSCULAR | Status: DC | PRN
  Administered 2018-04-02: 80 mg via INTRAVENOUS

## 2018-04-02 MED ORDER — ONDANSETRON HCL 4 MG/2ML IJ SOLN
INTRAMUSCULAR | Status: DC | PRN
  Administered 2018-04-02: 4 mg via INTRAVENOUS

## 2018-04-02 MED ORDER — SODIUM CHLORIDE 0.9 % IV SOLN
INTRAVENOUS | Status: DC
  Administered 2018-04-02: 23:00:00 via INTRAVENOUS

## 2018-04-02 MED ORDER — PROPOFOL 500 MG/50ML IV EMUL
INTRAVENOUS | Status: DC | PRN
  Administered 2018-04-02: 50 ug/kg/min via INTRAVENOUS

## 2018-04-02 MED ORDER — HYDROMORPHONE HCL 1 MG/ML IJ SOLN
INTRAMUSCULAR | Status: AC
  Administered 2018-04-02: 0.5 mg via INTRAVENOUS
  Filled 2018-04-02: qty 1

## 2018-04-02 MED ORDER — DEXAMETHASONE SODIUM PHOSPHATE 10 MG/ML IJ SOLN
INTRAMUSCULAR | Status: DC | PRN
  Administered 2018-04-02: 8 mg via INTRAVENOUS

## 2018-04-02 MED ORDER — CEFAZOLIN SODIUM-DEXTROSE 2-4 GM/100ML-% IV SOLN
2.0000 g | INTRAVENOUS | Status: AC
  Administered 2018-04-02: 2 g via INTRAVENOUS
  Filled 2018-04-02: qty 100

## 2018-04-02 MED ORDER — MIDAZOLAM HCL 2 MG/2ML IJ SOLN
INTRAMUSCULAR | Status: AC
  Filled 2018-04-02: qty 2

## 2018-04-02 MED ORDER — METOCLOPRAMIDE HCL 5 MG/ML IJ SOLN: 5.0000 mg | Freq: Three times a day (TID) | INTRAMUSCULAR | Status: DC | PRN

## 2018-04-02 MED ORDER — FLUTICASONE PROPIONATE 50 MCG/ACT NA SUSP
2.0000 | Freq: Every day | NASAL | Status: DC | PRN
  Filled 2018-04-02: qty 16

## 2018-04-02 MED ORDER — HYDROMORPHONE HCL 1 MG/ML IJ SOLN
0.2500 mg | INTRAMUSCULAR | Status: DC | PRN
  Administered 2018-04-02 (×4): 0.5 mg via INTRAVENOUS

## 2018-04-02 MED ORDER — STERILE WATER FOR IRRIGATION IR SOLN
Status: DC | PRN
  Administered 2018-04-02: 2000 mL

## 2018-04-02 MED ORDER — METHOCARBAMOL 500 MG IVPB - SIMPLE MED
INTRAVENOUS | Status: AC
  Administered 2018-04-02: 500 mg via INTRAVENOUS
  Filled 2018-04-02: qty 50

## 2018-04-02 MED ORDER — ACETAMINOPHEN 10 MG/ML IV SOLN: 1000.0000 mg | Freq: Once | INTRAVENOUS | Status: DC | PRN

## 2018-04-02 MED ORDER — BUPIVACAINE IN DEXTROSE 0.75-8.25 % IT SOLN
INTRATHECAL | Status: DC | PRN
  Administered 2018-04-02: 1.6 mL via INTRATHECAL

## 2018-04-02 MED ORDER — BISACODYL 10 MG RE SUPP: 10.0000 mg | Freq: Every day | RECTAL | Status: DC | PRN

## 2018-04-02 MED ORDER — METHOCARBAMOL 500 MG IVPB - SIMPLE MED
500.0000 mg | Freq: Four times a day (QID) | INTRAVENOUS | Status: DC | PRN
  Administered 2018-04-02: 500 mg via INTRAVENOUS
  Filled 2018-04-02: qty 50

## 2018-04-02 MED ORDER — PHENYLEPHRINE HCL 10 MG/ML IJ SOLN
INTRAMUSCULAR | Status: DC | PRN
  Administered 2018-04-02: 40 ug via INTRAVENOUS
  Administered 2018-04-02: 80 ug via INTRAVENOUS
  Administered 2018-04-02 (×6): 40 ug via INTRAVENOUS
  Administered 2018-04-02: 80 ug via INTRAVENOUS
  Administered 2018-04-02: 40 ug via INTRAVENOUS

## 2018-04-02 MED ORDER — DEXAMETHASONE SODIUM PHOSPHATE 10 MG/ML IJ SOLN: 10.0000 mg | Freq: Once | INTRAMUSCULAR | Status: DC

## 2018-04-02 MED ORDER — POLYETHYLENE GLYCOL 3350 17 G PO PACK
17.0000 g | PACK | Freq: Two times a day (BID) | ORAL | Status: DC
  Administered 2018-04-02 – 2018-04-04 (×4): 17 g via ORAL
  Filled 2018-04-02 (×4): qty 1

## 2018-04-02 MED ORDER — FERROUS SULFATE 325 (65 FE) MG PO TABS
325.0000 mg | ORAL_TABLET | Freq: Three times a day (TID) | ORAL | Status: DC
  Administered 2018-04-02 – 2018-04-04 (×5): 325 mg via ORAL
  Filled 2018-04-02 (×5): qty 1

## 2018-04-02 MED ORDER — ALUM & MAG HYDROXIDE-SIMETH 200-200-20 MG/5ML PO SUSP: 15.0000 mL | ORAL | Status: DC | PRN

## 2018-04-02 MED ORDER — METOPROLOL SUCCINATE ER 25 MG PO TB24
25.0000 mg | ORAL_TABLET | Freq: Every day | ORAL | Status: DC
  Filled 2018-04-02: qty 1

## 2018-04-02 SURGICAL SUPPLY — 43 items
ADH SKN CLS APL DERMABOND .7 (GAUZE/BANDAGES/DRESSINGS) ×1
BAG DECANTER FOR FLEXI CONT (MISCELLANEOUS) IMPLANT
BAG SPEC THK2 15X12 ZIP CLS (MISCELLANEOUS)
BAG ZIPLOCK 12X15 (MISCELLANEOUS) IMPLANT
BLADE SAG 18X100X1.27 (BLADE) ×2 IMPLANT
BLADE SURG SZ10 CARB STEEL (BLADE) ×4 IMPLANT
COVER PERINEAL POST (MISCELLANEOUS) ×2 IMPLANT
COVER SURGICAL LIGHT HANDLE (MISCELLANEOUS) ×2 IMPLANT
COVER WAND RF STERILE (DRAPES) IMPLANT
CUP ACETBLR 52 OD PINNACLE (Hips) ×1 IMPLANT
DERMABOND ADVANCED (GAUZE/BANDAGES/DRESSINGS) ×1
DERMABOND ADVANCED .7 DNX12 (GAUZE/BANDAGES/DRESSINGS) ×1 IMPLANT
DRAPE STERI IOBAN 125X83 (DRAPES) ×2 IMPLANT
DRAPE U-SHAPE 47X51 STRL (DRAPES) ×4 IMPLANT
DRESSING AQUACEL AG SP 3.5X10 (GAUZE/BANDAGES/DRESSINGS) ×1 IMPLANT
DRSG AQUACEL AG SP 3.5X10 (GAUZE/BANDAGES/DRESSINGS) ×2
DURAPREP 26ML APPLICATOR (WOUND CARE) ×2 IMPLANT
ELECT REM PT RETURN 15FT ADLT (MISCELLANEOUS) ×2 IMPLANT
ELIMINATOR HOLE APEX DEPUY (Hips) ×1 IMPLANT
FEM STEM 12/14 TAPER SZ 4 HIP (Orthopedic Implant) ×2 IMPLANT
FEMORAL STEM 12/14 TPR SZ4 HIP (Orthopedic Implant) IMPLANT
GLOVE BIOGEL PI IND STRL 7.5 (GLOVE) ×1 IMPLANT
GLOVE BIOGEL PI IND STRL 8.5 (GLOVE) ×1 IMPLANT
GLOVE BIOGEL PI INDICATOR 7.5 (GLOVE) ×1
GLOVE BIOGEL PI INDICATOR 8.5 (GLOVE) ×1
GLOVE ECLIPSE 8.0 STRL XLNG CF (GLOVE) ×4 IMPLANT
GLOVE ORTHO TXT STRL SZ7.5 (GLOVE) ×2 IMPLANT
GOWN STRL REUS W/TWL 2XL LVL3 (GOWN DISPOSABLE) ×2 IMPLANT
GOWN STRL REUS W/TWL LRG LVL3 (GOWN DISPOSABLE) ×2 IMPLANT
HEAD CERAMIC DELTA 36 PLUS 1.5 (Hips) ×1 IMPLANT
HOLDER FOLEY CATH W/STRAP (MISCELLANEOUS) ×2 IMPLANT
LINER NEUTRAL 52X36MM PLUS 4 (Liner) ×1 IMPLANT
PACK ANTERIOR HIP CUSTOM (KITS) ×2 IMPLANT
SCREW 6.5MMX30MM (Screw) ×1 IMPLANT
SUT MNCRL AB 4-0 PS2 18 (SUTURE) ×2 IMPLANT
SUT STRATAFIX 0 PDS 27 VIOLET (SUTURE) ×2
SUT VIC AB 1 CT1 36 (SUTURE) ×6 IMPLANT
SUT VIC AB 2-0 CT1 27 (SUTURE) ×4
SUT VIC AB 2-0 CT1 TAPERPNT 27 (SUTURE) ×2 IMPLANT
SUTURE STRATFX 0 PDS 27 VIOLET (SUTURE) ×1 IMPLANT
TRAY FOLEY CATH 14FRSI W/METER (CATHETERS) ×2 IMPLANT
WATER STERILE IRR 1000ML POUR (IV SOLUTION) ×3 IMPLANT
YANKAUER SUCT BULB TIP 10FT TU (MISCELLANEOUS) ×1 IMPLANT

## 2018-04-02 NOTE — Discharge Instructions (Signed)

## 2018-04-02 NOTE — Anesthesia Postprocedure Evaluation (Signed)
Anesthesia Post Note  Patient: Laurie [name]  Procedure(s) Performed: TOTAL HIP ARTHROPLASTY ANTERIOR APPROACH (Left )     Patient location during evaluation: PACU Anesthesia Type: Spinal Level of consciousness: awake and alert Pain management: pain level controlled Vital Signs Assessment: post-procedure vital signs reviewed and stable Respiratory status: spontaneous breathing, nonlabored ventilation, respiratory function stable and patient connected to nasal cannula oxygen Cardiovascular status: blood pressure returned to baseline and stable Postop Assessment: no apparent nausea or vomiting Anesthetic complications: no    Last Vitals:  Vitals:   04/02/18 1615 04/02/18 1638  BP: 134/88 137/90  Pulse: 72 79  Resp: 13 12  Temp: 36.7 C 36.5 C  SpO2: 98% 97%    Last Pain:  Vitals:   04/02/18 1640  TempSrc:   PainSc: Asleep                 Trevor Iha

## 2018-04-02 NOTE — Anesthesia Procedure Notes (Deleted)
Procedures

## 2018-04-02 NOTE — Op Note (Signed)
NAME:  Laurie [name]                ACCOUNT NO.: [id number]      MEDICAL RECORD NO.: [id number]      FACILITY:  [facility]      PHYSICIAN:  [name]  DATE OF BIRTH:  August 21, 1965     DATE OF PROCEDURE:  04/02/2018                                 OPERATIVE REPORT         PREOPERATIVE DIAGNOSIS: Left  hip osteoarthritis.      POSTOPERATIVE DIAGNOSIS:  Left hip osteoarthritis.      PROCEDURE:  Left total hip replacement through an anterior approach   utilizing DePuy THR system, component size 65mm pinnacle cup, a size 36+4 neutral   Altrex liner, a size 4 Hi Actis stem with a 36+1.5 delta ceramic   ball.      SURGEON:  Madlyn Frankel. Charlann Boxer, M.D.      ASSISTANT:  Lanney Gins, PA-C     ANESTHESIA:  General and Spinal.      SPECIMENS:  None.      COMPLICATIONS:  None.      BLOOD LOSS:  300 cc     DRAINS:  None.      INDICATION OF THE PROCEDURE:  Laurie [name] is a 53 y.o. female who had   presented to office for evaluation of left hip pain.  Radiographs revealed   progressive degenerative changes with bone-on-bone   articulation of the  hip joint, including subchondral cystic changes and osteophytes.  The patient had painful limited range of   motion significantly affecting their overall quality of life and function.  The patient was failing to    respond to conservative measures including medications and/or injections and activity modification and at this point was ready   to proceed with more definitive measures.  Consent was obtained for   benefit of pain relief.  Specific risks of infection, DVT, component   failure, dislocation, neurovascular injury, and need for revision surgery were reviewed in the office as well discussion of   the anterior versus posterior approach were reviewed.     PROCEDURE IN DETAIL:  The patient was brought to operative theater.   Once adequate anesthesia, preoperative antibiotics, 2 gm of Ancef, 1 gm of Tranexamic  Acid, and 10 mg of Decadron were administered, the patient was positioned supine on the [employer] table.  Once the patient was safely positioned with adequate padding of boney prominences we predraped out the hip, and used fluoroscopy to confirm orientation of the pelvis.      The left hip was then prepped and draped from proximal iliac crest to   mid thigh with a shower curtain technique.      Time-out was performed identifying the patient, planned procedure, and the appropriate extremity.     An incision was then made 2 cm lateral to the   anterior superior iliac spine extending over the orientation of the   tensor fascia lata muscle and sharp dissection was carried down to the   fascia of the muscle.      The fascia was then incised.  The muscle belly was identified and swept   laterally and retractor placed along the superior neck.  Following   cauterization of the circumflex vessels and removing  some pericapsular   fat, a second cobra retractor was placed on the inferior neck.  A T-capsulotomy was made along the line of the   superior neck to the trochanteric fossa, then extended proximally and   distally.  Tag sutures were placed and the retractors were then placed   intracapsular.  We then identified the trochanteric fossa and   orientation of my neck cut and then made a neck osteotomy with the femur on traction.  The femoral   head was removed without difficulty or complication.  Traction was let   off and retractors were placed posterior and anterior around the   acetabulum.      The labrum and foveal tissue were debrided.  I began reaming with a 46 mm   reamer and reamed up to 51 mm reamer with good bony bed preparation and a 52 mm  cup was chosen.  The final 52 mm Pinnacle cup was then impacted under fluoroscopy to confirm the depth of penetration and orientation with respect to   Abduction and forward flexion.  Cancellous bone from her femoral head was removed and packed into a  medium sized cyst within the acetabulum.  A screw was placed into the ilium followed by the hole eliminator.  The final 36+4 neutral Altrex liner was impacted with good visualized rim fit.  The cup was positioned anatomically within the acetabular portion of the pelvis.      At this point, the femur was rolled to 100 degrees.  Further capsule was   released off the inferior aspect of the femoral neck.  I then   released the superior capsule proximally.  With the leg in a neutral position the hook was placed laterally   along the femur under the vastus lateralis origin and elevated manually and then held in position using the hook attachment on the bed.  The leg was then extended and adducted with the leg rolled to 100   degrees of external rotation.  Retractors were placed along the medial calcar and posteriorly over the greater trochanter.  Once the proximal femur was fully   exposed, I used a box osteotome to set orientation.  I then began   broaching with the starting chili pepper broach and passed this by hand and then broached up to 4.  With the 4 broach in place I chose a high offset neck and did several trial reductions.  The offset was appropriate, leg lengths   appeared to be equal best matched with the +1.5 head ball trial confirmed radiographically.   Given these findings, I went ahead and dislocated the hip, repositioned all   retractors and positioned the right hip in the extended and abducted position.  The final 4 Hi Actis stem was   chosen and it was impacted down to the level of neck cut.  Based on this   and the trial reductions, a final 36+1.5 delta ceramic ball was chosen and   impacted onto a clean and dry trunnion, and the hip was reduced.  The   hip had been irrigated throughout the case again at this point.  I did   reapproximate the superior capsular leaflet to the anterior leaflet   using #1 Vicryl.  The fascia of the   tensor fascia lata muscle was then reapproximated  using #1 Vicryl and #0 Stratafix sutures.  The   remaining wound was closed with 2-0 Vicryl and running 4-0 Monocryl.   The hip was cleaned, dried, and  dressed sterilely using Dermabond and   Aquacel dressing.  The patient was then brought   to recovery room in stable condition tolerating the procedure well.    Lanney Gins, PA-C was present for the entirety of the case involved from   preoperative positioning, perioperative retractor management, general   facilitation of the case, as well as primary wound closure as assistant.            Madlyn Frankel Charlann Boxer, M.D.        04/02/2018 4:33 PM

## 2018-04-02 NOTE — Anesthesia Procedure Notes (Addendum)
Spinal  Patient location during procedure: OR Start time: 04/02/2018 1:20 PM End time: 04/02/2018 1:30 PM Staffing Anesthesiologist: Trevor Iha, MD Preanesthetic Checklist Completed: patient identified, site marked, surgical consent, pre-op evaluation, timeout performed, IV checked, risks and benefits discussed and monitors and equipment checked Spinal Block Patient position: sitting Prep: DuraPrep Patient monitoring: heart rate, cardiac monitor, continuous pulse ox and blood pressure Approach: midline Location: L3-4 Injection technique: single-shot Needle Needle type: Sprotte  Needle gauge: 24 G Needle length: 9 cm Needle insertion depth: 6 cm Assessment Sensory level: T4 Additional Notes 1 Attempt . Pt tolerated procedure well.

## 2018-04-02 NOTE — Anesthesia Procedure Notes (Signed)
Procedure Name: Intubation Date/Time: 04/02/2018 1:50 PM Performed by: Barnet Glasgow, MD Pre-anesthesia Checklist: Patient identified, Emergency Drugs available, Suction available, Patient being monitored and Timeout performed Patient Re-evaluated:Patient Re-evaluated prior to induction Oxygen Delivery Method: Circle system utilized Preoxygenation: Pre-oxygenation with 100% oxygen Induction Type: IV induction Ventilation: Mask ventilation without difficulty Laryngoscope Size: Mac and 3 Grade View: Grade I Tube type: Oral Tube size: 7.5 mm Number of attempts: 1 Airway Equipment and Method: Stylet Placement Confirmation: ETT inserted through vocal cords under direct vision,  positive ETCO2 and breath sounds checked- equal and bilateral Secured at: 22 cm Tube secured with: Tape Dental Injury: Teeth and Oropharynx as per pre-operative assessment

## 2018-04-02 NOTE — Anesthesia Procedure Notes (Deleted)
Spinal  Start time: 04/02/2018 1:17 PM Staffing Anesthesiologist: Trevor Iha, MD Performed: anesthesiologist

## 2018-04-02 NOTE — Interval H&P Note (Signed)
History and Physical Interval Note:  04/02/2018 12:09 PM  Laurie [name]  has presented today for surgery, with the diagnosis of left hip osteoarthritis  The various methods of treatment have been discussed with the patient and family. After consideration of risks, benefits and other options for treatment, the patient has consented to  Procedure(s): TOTAL HIP ARTHROPLASTY ANTERIOR APPROACH (Left) as a surgical intervention .  The patient's history has been reviewed, patient examined, no change in status, stable for surgery.  I have reviewed the patient's chart and labs.  Questions were answered to the patient's satisfaction.     [name]

## 2018-04-02 NOTE — Transfer of Care (Signed)
Immediate Anesthesia Transfer of Care Note  Patient: Laurie [name]  Procedure(s) Performed: TOTAL HIP ARTHROPLASTY ANTERIOR APPROACH (Left )  Patient Location: PACU  Anesthesia Type:General  Level of Consciousness: awake, oriented, drowsy and patient cooperative  Airway & Oxygen Therapy: Patient Spontanous Breathing and Patient connected to face mask oxygen  Post-op Assessment: Report given to RN, Post -op Vital signs reviewed and stable and Patient moving all extremities  Post vital signs: Reviewed and stable  Last Vitals:  Vitals Value Taken Time  BP 139/97 04/02/2018  3:27 PM  Temp    Pulse 89 04/02/2018  3:28 PM  Resp 18 04/02/2018  3:29 PM  SpO2 100 % 04/02/2018  3:28 PM  Vitals shown include unvalidated device data.  Last Pain:  Vitals:   04/02/18 1206  TempSrc: Oral         Complications: No apparent anesthesia complications

## 2018-04-03 ENCOUNTER — Encounter (HOSPITAL_COMMUNITY): Payer: Self-pay | Admitting: Orthopedic Surgery

## 2018-04-03 DIAGNOSIS — E669 Obesity, unspecified: Secondary | ICD-10-CM | POA: Diagnosis present

## 2018-04-03 LAB — BASIC METABOLIC PANEL
Anion gap: 9 (ref 5–15)
BUN: 16 mg/dL (ref 6–20)
CO2: 22 mmol/L (ref 22–32)
Calcium: 8.4 mg/dL — ABNORMAL LOW (ref 8.9–10.3)
Chloride: 102 mmol/L (ref 98–111)
Creatinine, Ser: 0.85 mg/dL (ref 0.44–1.00)
GFR calc Af Amer: >60 mL/min (ref >60)
GFR calc non Af Amer: >60 mL/min (ref >60)
Glucose, Bld: 142 mg/dL — ABNORMAL HIGH (ref 70–99)
Potassium: 4 mmol/L (ref 3.5–5.1)
Sodium: 133 mmol/L — ABNORMAL LOW (ref 135–145)

## 2018-04-03 LAB — CBC
HCT: 34.1 % — ABNORMAL LOW (ref 36.0–46.0)
Hemoglobin: 10.8 g/dL — ABNORMAL LOW (ref 12.0–15.0)
MCH: 26.3 pg (ref 26.0–34.0)
MCHC: 31.7 g/dL (ref 30.0–36.0)
MCV: 83 fL (ref 80.0–100.0)
Platelets: 247 10*3/uL (ref 150–400)
RBC: 4.11 MIL/uL (ref 3.87–5.11)
RDW: 14 % (ref 11.5–15.5)
WBC: 20.4 10*3/uL — ABNORMAL HIGH (ref 4.0–10.5)
nRBC: 0 % (ref 0.0–0.2)

## 2018-04-03 MED ORDER — METOPROLOL SUCCINATE ER 25 MG PO TB24
25.0000 mg | ORAL_TABLET | ORAL | Status: DC
  Administered 2018-04-03 – 2018-04-04 (×2): 25 mg via ORAL
  Filled 2018-04-03: qty 1

## 2018-04-03 MED ORDER — HYDROCODONE-ACETAMINOPHEN 7.5-325 MG PO TABS: 1.0000 | ORAL_TABLET | ORAL | 0 refills | Status: DC | PRN

## 2018-04-03 MED ORDER — METHOCARBAMOL 500 MG PO TABS: 500.0000 mg | ORAL_TABLET | Freq: Four times a day (QID) | ORAL | 0 refills | Status: DC | PRN

## 2018-04-03 MED ORDER — DOCUSATE SODIUM 100 MG PO CAPS: 100.0000 mg | ORAL_CAPSULE | Freq: Two times a day (BID) | ORAL | 0 refills | Status: DC

## 2018-04-03 MED ORDER — ASPIRIN 81 MG PO CHEW: 81.0000 mg | CHEWABLE_TABLET | Freq: Two times a day (BID) | ORAL | 0 refills | Status: AC

## 2018-04-03 MED ORDER — FERROUS SULFATE 325 (65 FE) MG PO TABS: 325.0000 mg | ORAL_TABLET | Freq: Three times a day (TID) | ORAL | 3 refills | Status: DC

## 2018-04-03 MED ORDER — OXYCODONE HCL 5 MG PO TABS: 5.0000 mg | ORAL_TABLET | ORAL | 0 refills | Status: DC | PRN

## 2018-04-03 MED ORDER — POLYETHYLENE GLYCOL 3350 17 G PO PACK: 17.0000 g | PACK | Freq: Two times a day (BID) | ORAL | 0 refills | Status: DC

## 2018-04-03 MED ORDER — ACETAMINOPHEN 500 MG PO TABS: 1000.0000 mg | ORAL_TABLET | Freq: Three times a day (TID) | ORAL | 0 refills | Status: DC

## 2018-04-03 MED FILL — METHOCARBAMOL 500 MG TABS: 500 | 10 days supply | Qty: 40 | Fill #0

## 2018-04-03 MED FILL — ASPIRIN CHILD 81 MG TAB CHE: 81 | 30 days supply | Qty: 60 | Fill #0

## 2018-04-03 NOTE — Progress Notes (Signed)
Physical Therapy Treatment Patient Details Name: Laurie [name] MRN: [id number] DOB: [birth year] Today's Date: 04/03/2018    History of Present Illness Pt s/p L THR and with hx of R THR(08) by posterior approach    PT Comments    Pt grudgingly cooperative and progressing slowly with mobility 2* c/o L hip pain with mobility.  Follow Up Recommendations  Follow surgeon's recommendation for DC plan and follow-up therapies     Equipment Recommendations  None recommended by PT    Recommendations for Other Services       Precautions / Restrictions Precautions Precautions: Fall Restrictions Weight Bearing Restrictions: No Other Position/Activity Restrictions: WBAT    Mobility  Bed Mobility Overal bed mobility: Needs Assistance Bed Mobility: Sit to Supine Rolling: Min assist   Supine to sit: Min assist Sit to supine: Min assist;Mod assist   General bed mobility comments: cues for sequence and use of R LE to self assist; physical assist to manage L LE  Transfers Overall transfer level: Needs assistance Equipment used: Rolling walker (2 wheeled) Transfers: Sit to/from Stand Sit to Stand: Min assist;Min guard         General transfer comment: cues for LE management and use of UEs to self assist  Ambulation/Gait Ambulation/Gait assistance: Min assist Gait Distance (Feet): 38 Feet Assistive device: Rolling walker (2 wheeled) Gait Pattern/deviations: Step-to pattern;Decreased step length - right;Decreased step length - left;Shuffle;Trunk flexed Gait velocity: decr   General Gait Details: cues for sequence, posture and position from RW; pt tolerated steps from bed to [facility] only - pain limited   Stairs             Wheelchair Mobility    Modified Rankin (Stroke Patients Only)       Balance Overall balance assessment: Mild deficits observed, not formally tested                                          Cognition Arousal/Alertness:  Awake/alert Behavior During Therapy: [facility] for tasks assessed/performed;Impulsive Overall Cognitive Status: Within Functional Limits for tasks assessed                                        Exercises      General Comments        Pertinent Vitals/Pain Pain Assessment: 0-10 Pain Score: 8  Pain Location: L hip Pain Descriptors / Indicators: Aching;Sore Pain Intervention(s): Limited activity within patient's tolerance;Monitored during session;Premedicated before session;Ice applied    Home Living Family/patient expects to be discharged to:: Private residence Living Arrangements: Spouse/significant other Available Help at Discharge: Family Type of Home: House Home Access: Level entry   Home Layout: Able to live on main level with bedroom/bathroom        Prior Function Level of Independence: Independent          PT Goals (current goals can now be found in the care plan section) Acute Rehab PT Goals Patient Stated Goal: Less pain PT Goal Formulation: With patient Time For Goal Achievement: 04/10/18 Potential to Achieve Goals: Good Progress towards PT goals: Progressing toward goals    Frequency    7X/week      PT Plan Current plan remains appropriate    Co-evaluation  AM-PAC PT "6 Clicks" Mobility   Outcome Measure  Help needed turning from your back to your side while in a flat bed without using bedrails?: A Little Help needed moving from lying on your back to sitting on the side of a flat bed without using bedrails?: A Little Help needed moving to and from a bed to a chair (including a wheelchair)?: A Little Help needed standing up from a chair using your arms (e.g., wheelchair or bedside chair)?: A Little Help needed to walk in hospital room?: A Little Help needed climbing 3-5 steps with a railing? : A Little 6 Click Score: 18    End of Session Equipment Utilized During Treatment: Gait belt Activity Tolerance: Patient  limited by pain Patient left: in bed;with call bell/phone within reach Nurse Communication: Mobility status PT Visit Diagnosis: Difficulty in walking, not elsewhere classified (R26.2)     Time: 0086-7619 PT Time Calculation (min) (ACUTE ONLY): 21 min  Charges:  $Gait Training: 8-22 mins                     Mauro Kaufmann PT Acute Rehabilitation Services Pager [phone] Office [phone]    BRADSHAW,HUNTER 04/03/2018, 1:26 PM

## 2018-04-03 NOTE — Care Management Note (Addendum)
Case Management Note  Patient Details  Name: REESHEMAH TETTERTON MRN: [id number] Date of Birth: [birth year]  Subjective/Objective:                  DISCHARGE PLANNING  Action/Plan: HHC-HEP DME ROLLING WALKER HAS AT [facility] IN 1 ORDERED THROUGH [facility]  Expected Discharge Date:  04/03/18               Expected Discharge Plan:  Home/Self Care  In-House Referral:     Discharge planning Services  CM Consult  Post Acute Care Choice:  Durable Medical Equipment Choice offered to:  Patient  DME Arranged:  3-N-1, Walker rolling DME Agency:  [facility]  HH Arranged:    HH Agency:     Status of Service:  Completed, signed off  If discussed at Long Length of Stay Meetings, dates discussed:    Additional Comments:  Dazha, Pinto, RN 04/03/2018, 9:51 AM

## 2018-04-03 NOTE — Plan of Care (Signed)
  Problem: Education: Goal: Knowledge of the prescribed therapeutic regimen will improve Outcome: Progressing   Problem: Pain Management: Goal: Pain level will decrease with appropriate interventions Outcome: Progressing   Problem: Clinical Measurements: Goal: Will remain free from infection Outcome: Progressing Goal: Respiratory complications will improve Outcome: Progressing

## 2018-04-03 NOTE — Evaluation (Signed)
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  Pt should progress to dc home with family assist.    Follow Up Recommendations Follow surgeon's recommendation for DC plan and follow-up therapies    Equipment Recommendations  None recommended by PT    Recommendations for Other Services       Precautions / Restrictions Precautions Precautions: Fall Restrictions Weight Bearing Restrictions: No      Mobility  Bed Mobility Overal bed mobility: Needs Assistance Bed Mobility: Supine to Sit     Supine to sit: Min assist     General bed mobility comments: cues for sequence and use of R LE to self assist; physical assist to manage L LE  Transfers Overall transfer level: Needs assistance Equipment used: Rolling walker (2 wheeled) Transfers: Sit to/from Stand Sit to Stand: Min assist;Min guard         General transfer comment: cues for LE management and use of UEs to self assist  Ambulation/Gait Ambulation/Gait assistance: Min assist Gait Distance (Feet): 3 Feet Assistive device: Rolling walker (2 wheeled) Gait Pattern/deviations: Step-to pattern;Decreased step length - right;Decreased step length - left;Shuffle;Trunk flexed Gait velocity: decr   General Gait Details: cues for sequence, posture and position from RW; pt tolerated steps from bed to [facility] only - pain limited  Stairs            Wheelchair Mobility    Modified Rankin (Stroke Patients Only)       Balance Overall balance assessment: Mild deficits observed, not formally tested                                           Pertinent Vitals/Pain Pain Assessment: 0-10 Pain Score: 8  Pain  Location: L hip Pain Descriptors / Indicators: Aching;Sore Pain Intervention(s): Limited activity within patient's tolerance;Monitored during session;Premedicated before session    Home Living Family/patient expects to be discharged to:: Private residence Living Arrangements: Spouse/significant other Available Help at Discharge: Family Type of Home: House Home Access: Level entry     Home Layout: Able to live on main level with bedroom/bathroom        Prior Function Level of Independence: Independent               Hand Dominance        Extremity/Trunk Assessment   Upper Extremity Assessment Upper Extremity Assessment: Overall WFL for tasks assessed    Lower Extremity Assessment Lower Extremity Assessment: LLE deficits/detail    Cervical / Trunk Assessment Cervical / Trunk Assessment: Normal  Communication   Communication: No difficulties  Cognition Arousal/Alertness: Awake/alert Behavior During Therapy: WFL for tasks assessed/performed;Impulsive Overall Cognitive Status: Within Functional Limits for tasks assessed                                        General Comments      Exercises     Assessment/Plan    PT Assessment Patient needs continued PT services  PT Problem List Decreased strength;Decreased range of motion;Decreased activity  tolerance;Decreased mobility;Pain;Decreased knowledge of use of DME;Obesity       PT Treatment Interventions DME instruction;Gait training;Functional mobility training;Therapeutic activities;Therapeutic exercise;Patient/family education    PT Goals (Current goals can be found in the Care Plan section)  Acute Rehab PT Goals Patient Stated Goal: Less pain PT Goal Formulation: With patient Time For Goal Achievement: 04/10/18 Potential to Achieve Goals: Good    Frequency 7X/week   Barriers to discharge        Co-evaluation               AM-PAC PT "6 Clicks" Mobility  Outcome Measure Help  needed turning from your back to your side while in a flat bed without using bedrails?: A Little Help needed moving from lying on your back to sitting on the side of a flat bed without using bedrails?: A Little Help needed moving to and from a bed to a chair (including a wheelchair)?: A Little Help needed standing up from a chair using your arms (e.g., wheelchair or bedside chair)?: A Little Help needed to walk in hospital room?: A Little Help needed climbing 3-5 steps with a railing? : A Little 6 Click Score: 18    End of Session Equipment Utilized During Treatment: Gait belt Activity Tolerance: Patient limited by pain Patient left: Other (comment)(BSC) Nurse Communication: Mobility status PT Visit Diagnosis: Difficulty in walking, not elsewhere classified (R26.2)    Time: 1100-1113 PT Time Calculation (min) (ACUTE ONLY): 13 min   Charges:   PT Evaluation $PT Eval Low Complexity: 1 Low          Mauro Kaufmann PT Acute Rehabilitation Services Pager [phone] Office [phone]   BRADSHAW,HUNTER 04/03/2018, 1:17 PM

## 2018-04-03 NOTE — Progress Notes (Signed)
   04/03/18 0127  Vitals  Pulse Rate (!) 105   Pt. states she is in pain, robaxin  given. RN will continue to monitor.

## 2018-04-03 NOTE — Progress Notes (Signed)
     Subjective: 1 Day Post-Op Procedure(s) (LRB): TOTAL HIP ARTHROPLASTY ANTERIOR APPROACH (Left)   Patient reports pain as severe, not well controlled.  Discussed muscle spasms and the consistent use of a muscle relaxer.  Discussed the changing of the Norco to Oxycodone to help better control her pain.  Other than pain, no events throughout the night.    Objective:   VITALS:   Vitals:   04/03/18 0536 04/03/18 0702  BP: (!) 143/85   Pulse: (!) 102 (!) 125  Resp: 20   Temp: 99.8 F (37.7 C)   SpO2: 98%     Dorsiflexion/Plantar flexion intact Incision: dressing C/D/I No cellulitis present Compartment soft  LABS Recent Labs    04/03/18 0524  HGB 10.8*  HCT 34.1*  WBC 20.4*  PLT 247    Recent Labs    04/03/18 0524  NA 133*  K 4.0  BUN 16  CREATININE 0.85  GLUCOSE 142*     Assessment/Plan: 1 Day Post-Op Procedure(s) (LRB): TOTAL HIP ARTHROPLASTY ANTERIOR APPROACH (Left) Changing from Norco to Oxycodone Foley cath d/c'ed Advance diet Up with therapy D/C IV fluids Discharge home eventually when ready  Obese (BMI 30-39.9) Estimated body mass index is 34.6 kg/m as calculated from the following:   Height as of this encounter: 5\' 7"  (1.702 m).   Weight as of this encounter: 100.2 kg. Patient also counseled that weight may inhibit the healing process Patient counseled that losing weight will help with future health issues     Anastasio Auerbach. Babish   PAC  04/03/2018, 8:55 AM

## 2018-04-03 NOTE — Progress Notes (Signed)
Physical Therapy Treatment Patient Details Name: Laurie [name] MRN: [id number] DOB: 1965-08-18 Today's Date: 04/03/2018    History of Present Illness Pt s/p L THR and with hx of R THR(08) by posterior approach    PT Comments    Marked improvement in pain control and activity tolerance this pm.     Follow Up Recommendations  Follow surgeon's recommendation for DC plan and follow-up therapies     Equipment Recommendations  None recommended by PT    Recommendations for Other Services       Precautions / Restrictions Precautions Precautions: Fall Restrictions Weight Bearing Restrictions: No Other Position/Activity Restrictions: WBAT    Mobility  Bed Mobility Overal bed mobility: Needs Assistance Bed Mobility: Sit to Supine Rolling: Min assist   Supine to sit: Min assist Sit to supine: Min assist   General bed mobility comments: cues for sequence and use of R LE to self assist; physical assist to manage L LE  Transfers Overall transfer level: Needs assistance Equipment used: Rolling walker (2 wheeled) Transfers: Sit to/from Stand Sit to Stand: Min guard         General transfer comment: cues for LE management and use of UEs to self assist  Ambulation/Gait Ambulation/Gait assistance: Min guard Gait Distance (Feet): 130 Feet Assistive device: Rolling walker (2 wheeled) Gait Pattern/deviations: Step-to pattern;Decreased step length - right;Decreased step length - left;Shuffle;Trunk flexed Gait velocity: decr   General Gait Details: cues for sequence, posture and position from RW; pt tolerated steps from bed to [facility] only - pain limited   Stairs             Wheelchair Mobility    Modified Rankin (Stroke Patients Only)       Balance Overall balance assessment: Mild deficits observed, not formally tested                                          Cognition Arousal/Alertness: Awake/alert Behavior During Therapy: [facility] for tasks  assessed/performed;Impulsive Overall Cognitive Status: Within Functional Limits for tasks assessed                                        Exercises Total Joint Exercises Ankle Circles/Pumps: AROM;Both;Supine;15 reps Quad Sets: AROM;Both;10 reps;Supine Heel Slides: AAROM;Left;20 reps;Supine Hip ABduction/ADduction: AAROM;Left;15 reps;Supine    General Comments        Pertinent Vitals/Pain Pain Assessment: 0-10 Pain Score: 7  Pain Location: L hip Pain Descriptors / Indicators: Aching;Sore Pain Intervention(s): Limited activity within patient's tolerance;Monitored during session;Ice applied;Patient requesting pain meds-RN notified;RN gave pain meds during session    Home Living Family/patient expects to be discharged to:: Private residence Living Arrangements: Spouse/significant other Available Help at Discharge: Family Type of Home: House Home Access: Level entry   Home Layout: Able to live on main level with bedroom/bathroom        Prior Function Level of Independence: Independent          PT Goals (current goals can now be found in the care plan section) Acute Rehab PT Goals Patient Stated Goal: Regain IND PT Goal Formulation: With patient Time For Goal Achievement: 04/10/18 Potential to Achieve Goals: Good Progress towards PT goals: Progressing toward goals    Frequency    7X/week      PT Plan Current  plan remains appropriate    Co-evaluation              AM-PAC PT "6 Clicks" Mobility   Outcome Measure  Help needed turning from your back to your side while in a flat bed without using bedrails?: A Little Help needed moving from lying on your back to sitting on the side of a flat bed without using bedrails?: A Little Help needed moving to and from a bed to a chair (including a wheelchair)?: A Little Help needed standing up from a chair using your arms (e.g., wheelchair or bedside chair)?: A Little Help needed to walk in hospital  room?: A Little Help needed climbing 3-5 steps with a railing? : A Little 6 Click Score: 18    End of Session Equipment Utilized During Treatment: Gait belt Activity Tolerance: Patient tolerated treatment well;Patient limited by pain Patient left: in bed;with call bell/phone within reach Nurse Communication: Mobility status PT Visit Diagnosis: Difficulty in walking, not elsewhere classified (R26.2)     Time: 6734-1937 PT Time Calculation (min) (ACUTE ONLY): 30 min  Charges:  $Gait Training: 8-22 mins $Therapeutic Exercise: 8-22 mins                     Mauro Kaufmann PT Acute Rehabilitation Services Pager [phone] Office [phone]    Laurie [name],Laurie [name] 04/03/2018, 4:47 PM

## 2018-04-04 LAB — CBC
HCT: 31.1 % — ABNORMAL LOW (ref 36.0–46.0)
Hemoglobin: 9.8 g/dL — ABNORMAL LOW (ref 12.0–15.0)
MCH: 26.3 pg (ref 26.0–34.0)
MCHC: 31.5 g/dL (ref 30.0–36.0)
MCV: 83.4 fL (ref 80.0–100.0)
Platelets: 223 10*3/uL (ref 150–400)
RBC: 3.73 MIL/uL — ABNORMAL LOW (ref 3.87–5.11)
RDW: 14.3 % (ref 11.5–15.5)
WBC: 19.9 10*3/uL — ABNORMAL HIGH (ref 4.0–10.5)
nRBC: 0 % (ref 0.0–0.2)

## 2018-04-04 LAB — BASIC METABOLIC PANEL
Anion gap: 5 (ref 5–15)
BUN: 18 mg/dL (ref 6–20)
CO2: 25 mmol/L (ref 22–32)
Calcium: 8.3 mg/dL — ABNORMAL LOW (ref 8.9–10.3)
Chloride: 105 mmol/L (ref 98–111)
Creatinine, Ser: 0.81 mg/dL (ref 0.44–1.00)
GFR calc Af Amer: >60 mL/min (ref >60)
GFR calc non Af Amer: >60 mL/min (ref >60)
Glucose, Bld: 128 mg/dL — ABNORMAL HIGH (ref 70–99)
Potassium: 4 mmol/L (ref 3.5–5.1)
Sodium: 135 mmol/L (ref 135–145)

## 2018-04-04 MED ORDER — HYDROCODONE-ACETAMINOPHEN 7.5-325 MG PO TABS: 1.0000 | ORAL_TABLET | ORAL | 0 refills | Status: DC | PRN

## 2018-04-04 MED FILL — HYDROCODON-APAP 7.5-325: 7.5-325 | 5 days supply | Qty: 60 | Fill #0

## 2018-04-04 NOTE — Progress Notes (Signed)
Physical Therapy Treatment Patient Details Name: Laurie [name] MRN: [id number] DOB: 1965-04-25 Today's Date: 04/04/2018    History of Present Illness Pt s/p L THR and with hx of R THR(08) by posterior approach    PT Comments    POD # 1 Assisted out of bathroom to Then returned to room to perform some TE's following HEP handout.  Instructed on proper tech, freq as well as use of ICE.  Assisted back to bed to rest.  Addressed all mobility questions, discussed appropriate activity, educated on use of ICE.  Pt ready for D/C to home.    Follow Up Recommendations  Follow surgeon's recommendation for DC plan and follow-up therapies     Equipment Recommendations  None recommended by PT    Recommendations for Other Services       Precautions / Restrictions Precautions Precautions: Fall Restrictions Weight Bearing Restrictions: No Other Position/Activity Restrictions: WBAT    Mobility  Bed Mobility Overal bed mobility: Needs Assistance Bed Mobility: Sit to Supine     Supine to sit: Supervision;Min guard Sit to supine: Min guard;Supervision   General bed mobility comments: demonstared and instructed how to use a belt to self assist LE off/onto bed   Transfers Overall transfer level: Needs assistance Equipment used: Rolling walker (2 wheeled) Transfers: Sit to/from Stand Sit to Stand: Supervision         General transfer comment: cues for LE management and use of UEs to self assist  Ambulation/Gait Ambulation/Gait assistance: Supervision Gait Distance (Feet): 4 Feet(from BR to bed) Assistive device: Rolling walker (2 wheeled) Gait Pattern/deviations: Step-to pattern;Decreased step length - right;Decreased step length - left;Shuffle;Trunk flexed Gait velocity: decreased    General Gait Details: one VC safety with turns using walker   Stairs Stairs: (no stairs to enter home)           Wheelchair Mobility    Modified Rankin (Stroke Patients Only)       Balance                                            Cognition Arousal/Alertness: Awake/alert Behavior During Therapy: WFL for tasks assessed/performed Overall Cognitive Status: Within Functional Limits for tasks assessed                                 General Comments: motivated/independant      Exercises   Total Hip Replacement TE's 10 reps ankle pumps 10 reps knee presses 10 reps heel slides 10 reps SAQ's 10 reps ABD 10 reps LAQ's 10 reps all standing TE's  Followed by ICE     General Comments        Pertinent Vitals/Pain Pain Assessment: 0-10 Pain Score: 3  Pain Location: L hip Pain Descriptors / Indicators: Aching;Sore Pain Intervention(s): Monitored during session;Repositioned;Ice applied    Home Living                      Prior Function            PT Goals (current goals can now be found in the care plan section) Progress towards PT goals: Progressing toward goals    Frequency    7X/week      PT Plan Current plan remains appropriate    Co-evaluation  AM-PAC PT "6 Clicks" Mobility   Outcome Measure  Help needed turning from your back to your side while in a flat bed without using bedrails?: A Little Help needed moving from lying on your back to sitting on the side of a flat bed without using bedrails?: A Little Help needed moving to and from a bed to a chair (including a wheelchair)?: A Little Help needed standing up from a chair using your arms (e.g., wheelchair or bedside chair)?: A Little Help needed to walk in hospital room?: A Little Help needed climbing 3-5 steps with a railing? : A Little 6 Click Score: 18    End of Session Equipment Utilized During Treatment: Gait belt Activity Tolerance: Patient tolerated treatment well;Patient limited by pain Patient left: in bed Nurse Communication: (pt ready for D/C to home) PT Visit Diagnosis: Difficulty in walking, not elsewhere  classified (R26.2)     Time: 4854-6270 PT Time Calculation (min) (ACUTE ONLY): 12 min  Charges:  $Gait Training: 8-22 mins $Therapeutic Exercise: 8-22 mins                     Felecia Shelling  PTA Acute  Rehabilitation Services Pager      [phone] Office      [phone]

## 2018-04-04 NOTE — Progress Notes (Signed)
Physical Therapy Treatment Patient Details Name: Laurie [name] MRN: [id number] DOB: [birth year] Today's Date: 04/04/2018    History of Present Illness Pt s/p L THR and with hx of R THR(08) by posterior approach    PT Comments    POD # 1 am session. Assisted OOB. Demonstrated and instructed pt how to use belt to self assist LE as a leg lifter Assisted with amb a functional distance in hallway.  Assisted to bathroom.  Pt requested privacy and increased time to "wash up" Left pt in bathroom with spouse in room.   Follow Up Recommendations  Follow surgeon's recommendation for DC plan and follow-up therapies     Equipment Recommendations  None recommended by PT    Recommendations for Other Services       Precautions / Restrictions Precautions Precautions: Fall Restrictions Weight Bearing Restrictions: No Other Position/Activity Restrictions: WBAT    Mobility  Bed Mobility Overal bed mobility: Needs Assistance Bed Mobility: Supine to Sit     Supine to sit: Supervision;Min guard     General bed mobility comments: demonstared and instructed how to use a belt to self assist LE off/onto bed   Transfers Overall transfer level: Needs assistance Equipment used: Rolling walker (2 wheeled) Transfers: Sit to/from Stand Sit to Stand: Supervision         General transfer comment: cues for LE management and use of UEs to self assist  Ambulation/Gait Ambulation/Gait assistance: Supervision Gait Distance (Feet): 125 Feet Assistive device: Rolling walker (2 wheeled) Gait Pattern/deviations: Step-to pattern;Decreased step length - right;Decreased step length - left;Shuffle;Trunk flexed Gait velocity: decreased    General Gait Details: one VC safety with turns using walker   Stairs Stairs: (no stairs to enter home)           Wheelchair Mobility    Modified Rankin (Stroke Patients Only)       Balance                                            Cognition Arousal/Alertness: Awake/alert Behavior During Therapy: WFL for tasks assessed/performed Overall Cognitive Status: Within Functional Limits for tasks assessed                                 General Comments: motivated/independant      Exercises      General Comments        Pertinent Vitals/Pain Pain Assessment: 0-10 Pain Score: 3  Pain Location: L hip Pain Descriptors / Indicators: Aching;Sore Pain Intervention(s): Monitored during session;Repositioned;Ice applied    Home Living                      Prior Function            PT Goals (current goals can now be found in the care plan section) Progress towards PT goals: Progressing toward goals    Frequency    7X/week      PT Plan Current plan remains appropriate    Co-evaluation              AM-PAC PT "6 Clicks" Mobility   Outcome Measure  Help needed turning from your back to your side while in a flat bed without using bedrails?: A Little Help needed moving from lying on your back to sitting on  the side of a flat bed without using bedrails?: A Little Help needed moving to and from a bed to a chair (including a wheelchair)?: A Little Help needed standing up from a chair using your arms (e.g., wheelchair or bedside chair)?: A Little Help needed to walk in hospital room?: A Little Help needed climbing 3-5 steps with a railing? : A Little 6 Click Score: 18    End of Session Equipment Utilized During Treatment: Gait belt Activity Tolerance: Patient tolerated treatment well;Patient limited by pain Patient left: with call bell/phone within reach;Other (comment)(in bathroom)   PT Visit Diagnosis: Difficulty in walking, not elsewhere classified (R26.2)     Time: 1035-1050 PT Time Calculation (min) (ACUTE ONLY): 15 min  Charges:  $Gait Training: 8-22 mins                     Felecia Shelling  PTA Acute  Rehabilitation Services Pager      [phone] Office       [phone]

## 2018-04-04 NOTE — Progress Notes (Signed)
     Subjective: 2 Days Post-Op Procedure(s) (LRB): TOTAL HIP ARTHROPLASTY ANTERIOR APPROACH (Left)   Patient reports pain as mild, pain is much better controlled today even though the meds were not changed from yesterday.  We have discussed the post-op expectations.  Patient is read to be discharged home.      Objective:   VITALS:   Vitals:   04/03/18 2006 04/04/18 0513  BP: 128/64 119/76  Pulse: 97 95  Resp: 15 16  Temp: 98.7 F (37.1 C) 98.7 F (37.1 C)  SpO2: 97% 97%    Dorsiflexion/Plantar flexion intact Incision: dressing C/D/I No cellulitis present Compartment soft  LABS Recent Labs    04/03/18 0524 04/04/18 0449  HGB 10.8* 9.8*  HCT 34.1* 31.1*  WBC 20.4* 19.9*  PLT 247 223    Recent Labs    04/03/18 0524 04/04/18 0449  NA 133* 135  K 4.0 4.0  BUN 16 18  CREATININE 0.85 0.81  GLUCOSE 142* 128*     Assessment/Plan: 2 Days Post-Op Procedure(s) (LRB): TOTAL HIP ARTHROPLASTY ANTERIOR APPROACH (Left) Up with therapy Discharge home  Follow up in 2 weeks at [facility]). Follow up with OLIN,MATTHEW D in 2 weeks.  Contact information:  [facility]) [address], [address] [place] [address] [phone]        Anastasio Auerbach. Babish   PAC  04/04/2018, 7:00 AM

## 2018-04-04 NOTE — Progress Notes (Addendum)
RN assisted patient to the bathroom. Patient refused gait belt, patient educated on use of gait belt. Patient still refused gait belt.. Patient asked RN to leave the room while she uses the bathroom. RN educated patient on why it is important for RN to stay in the room. Patient requested RN leave. RN educated patient to pull the wall call bell for assistance before she stands up. Patient agreed to call before she stands up.

## 2018-04-05 ENCOUNTER — Other Ambulatory Visit: Payer: Self-pay | Admitting: *Deleted

## 2018-04-05 NOTE — Patient Outreach (Signed)
[facility]) Care Management  04/05/2018  DOSHA NARAMORE [birth year] [phone]   Transition of care telephone call  Referral received: 04/03/18 Initial outreach: 04/05/18 Surgery/procedure date: 04/02/18 Insurance: [facility] Choice Plan  Initial unsuccessful telephone call to patient's preferred number in order to complete transition of care assessment; no answer, left HIPAA compliant voicemail message requesting return call.   Objective: Per the electronic medical record, Mrs. Eshleman  was hospitalized at [facility] from 3/3-04/04/18 for left hip arthroplasty related to osteoarthritis.   Comorbidities include: GERD, fibromyalgia, and obesity She was discharged to home on 04/04/18 without the need for home health services and provided durable medical equipment of a 3-in-1 , the patient already had a rolling walker.  Plan: If no return call from the patient by the end of business day today, this RNCM will route unsuccessful outreach letter with [facility] Care Management pamphlet and 24 hour Nurse Advice Line Magnet to [facility] Care Management clinical pool to be mailed to patient's home address. This RNCM will attempt another outreach within 4 business days.  Bary Richard RN,CCM,CDE [facility] Care Management Coordinator Office Phone [phone] Office Fax [phone]

## 2018-04-08 NOTE — Discharge Summary (Signed)
Physician Discharge Summary  Patient ID: Laurie [name] MRN: [id number] DOB/AGE: 52-Oct-1967 52 y.o.  Admit date: 04/02/2018 Discharge date: 04/04/2018   Procedures:  Procedure(s) (LRB): TOTAL HIP ARTHROPLASTY ANTERIOR APPROACH (Left)  Attending Physician:  Dr. Durene Romans   Admission Diagnoses:    Left hip primary OA / pain  Discharge Diagnoses:  Principal Problem:   S/P left THA Active Problems:   Status post left hip replacement   Obese  Past Medical History:  Diagnosis Date  . Acne vulgaris 01/19/15  . Allergic rhinitis   . Anxiety    pt denies  . Arthritis of knee   . Arthritis of right hip   . Borderline diabetes 11/14/11  . Eczematous dermatitis 11/08/2010   eczema  . Edema of both legs   . Endometriosis   . Esophageal reflux   . Fibromyalgia   . Gout 02/08/12   idopathic gout, left ankle and foot  . Gout   . Gynecomastia 11/18/13   hypertrophy of breast  . Menopause 11/14/11  . Numbness in both hands   . Obesity, unspecified 07/04/05  . Paresthesias/numbness 11/14/11  . Pre-diabetes   . Premature menopause 07/09/13  . Sinus tachycardia   . Sinus tachycardia   . Spider veins of both lower extremities 04/12/15  . Varicose veins   . Varicose veins of bilateral lower extremities with other complications 04/26/15    HPI:    Laurie [name], 53 y.o. female, has a history of pain and functional disability in the left hip(s) due to arthritis and patient has failed non-surgical conservative treatments for greater than 12 weeks to include activity modification.  Onset of symptoms was gradual starting 2 years ago with gradually worsening course since that time.The patient noted no past surgery on the left hip(s).  Patient currently rates pain in the left hip at 10 out of 10 with activity. Patient has worsening of pain with activity and weight bearing and pain that interfers with activities of daily living. Patient has evidence of significant degenerative  changes of the left hip. by imaging studies. There is no current active infection.  PCP: Fleet Contras, MD   Discharged Condition: good  Hospital Course:  Patient underwent the above stated procedure on 04/02/2018. Patient tolerated the procedure well and brought to the recovery room in good condition and subsequently to the floor.  POD #1 BP: 143/85 ; Pulse: 102 ; Temp: 99.8 F (37.7 C) ; Resp: 20  Patient reports pain as severe, not well controlled.  Discussed muscle spasms and the consistent use of a muscle relaxer.  Discussed the changing of the Norco to Oxycodone to help better control her pain.  Other than pain, no events throughout the night.  Dorsiflexion/plantar flexion intact, incision: dressing C/D/I, no cellulitis present and compartment soft.   LABS  Basename    HGB     10.8  HCT     34.1   POD #2  BP: 119/76 ; Pulse: 95 ; Temp: 98.7 F (37.1 C) ; Resp: 16 Patient reports pain as mild, pain is much better controlled today even though the meds were not changed from yesterday.  We have discussed the post-op expectations.  Patient is read to be discharged home.   Dorsiflexion/plantar flexion intact, incision: dressing C/D/I, no cellulitis present and compartment soft.   LABS  Basename    HGB     9.8  HCT     31.1     Discharge Exam: General appearance: alert,  cooperative and no distress Extremities: Homans sign is negative, no sign of DVT, no edema, redness or tenderness in the calves or thighs and no ulcers, gangrene or trophic changes  Disposition:  Home with follow up in 2 weeks   Follow-up Information    Durene Romans, MD. Schedule an appointment as soon as possible for a visit in 2 weeks.   Specialty:  Orthopedic Surgery Contact information: [address] [place] [address] [phone]           Discharge Instructions    Call MD / Call 911   Complete by:  As directed    If you experience chest pain or shortness of breath, CALL  911 and be transported to the hospital emergency room.  If you develope a fever above 101 F, pus (white drainage) or increased drainage or redness at the wound, or calf pain, call your surgeon's office.   Change dressing   Complete by:  As directed    Maintain surgical dressing until follow up in the clinic. If the edges start to pull up, may reinforce with tape. If the dressing is no longer working, may remove and cover with gauze and tape, but must keep the area dry and clean.  Call with any questions or concerns.   Constipation Prevention   Complete by:  As directed    Drink plenty of fluids.  Prune juice may be helpful.  You may use a stool softener, such as Colace (over the counter) 100 mg twice a day.  Use MiraLax (over the counter) for constipation as needed.   Diet - low sodium heart healthy   Complete by:  As directed    Discharge instructions   Complete by:  As directed    Maintain surgical dressing until follow up in the clinic. If the edges start to pull up, may reinforce with tape. If the dressing is no longer working, may remove and cover with gauze and tape, but must keep the area dry and clean.  Follow up in 2 weeks at [facility]. Call with any questions or concerns.   Increase activity slowly as tolerated   Complete by:  As directed    Weight bearing as tolerated with assist device (walker, cane, etc) as directed, use it as long as suggested by your surgeon or therapist, typically at least 4-6 weeks.   TED hose   Complete by:  As directed    Use stockings (TED hose) for 2 weeks on both leg(s).  You may remove them at night for sleeping.      Allergies as of 04/04/2018      Reactions   Sulfa Antibiotics Swelling   Sulfasalazine Swelling      Medication List    STOP taking these medications   naproxen 500 MG tablet Commonly known as:  NAPROSYN     TAKE these medications   acetaminophen 500 MG tablet Commonly known as:  TYLENOL Take 2 tablets (1,000 mg  total) by mouth every 8 (eight) hours.   allopurinol 100 MG tablet Commonly known as:  ZYLOPRIM Take 100 mg by mouth daily.   aspirin 81 MG chewable tablet Commonly known as:  Aspirin Childrens Chew 1 tablet (81 mg total) by mouth 2 (two) times daily for 30 days. Take for 4 weeks, then resume regular dose.   Biotin 5000 5 MG Caps Generic drug:  Biotin Take 5 mg by mouth daily.   calcium-vitamin D 500-200 MG-UNIT tablet Commonly known as:  OSCAL WITH D Take 1 tablet by mouth daily with breakfast.   docusate sodium 100 MG capsule Commonly known as:  Colace Take 1 capsule (100 mg total) by mouth 2 (two) times daily.   ferrous sulfate 325 (65 FE) MG tablet Commonly known as:  FerrouSul Take 1 tablet (325 mg total) by mouth 3 (three) times daily with meals.   fluticasone 50 MCG/ACT nasal spray Commonly known as:  FLONASE Place 2 sprays into both nostrils daily as needed for allergies.   folic acid 1 MG tablet Commonly known as:  FOLVITE Take 1 mg by mouth daily.   gabapentin 300 MG capsule Commonly known as:  NEURONTIN Take 300 mg by mouth 2 (two) times daily.   HYDROcodone-acetaminophen 7.5-325 MG tablet Commonly known as:  Norco Take 1-2 tablets by mouth every 4 (four) hours as needed for moderate pain.   lubiprostone 24 MCG capsule Commonly known as:  AMITIZA Take 24 mcg by mouth daily as needed for constipation.   methocarbamol 500 MG tablet Commonly known as:  Robaxin Take 1 tablet (500 mg total) by mouth every 6 (six) hours as needed for muscle spasms.   metoprolol succinate 25 MG 24 hr tablet Commonly known as:  TOPROL-XL Take 1 tablet (25 mg total) by mouth daily.   multivitamins ther. w/minerals Tabs tablet Take 1 tablet by mouth daily.   polyethylene glycol packet Commonly known as:  MIRALAX / GLYCOLAX Take 17 g by mouth 2 (two) times daily.            Discharge Care Instructions  (From admission, onward)         Start     Ordered    04/04/18 0000  Change dressing    Comments:  Maintain surgical dressing until follow up in the clinic. If the edges start to pull up, may reinforce with tape. If the dressing is no longer working, may remove and cover with gauze and tape, but must keep the area dry and clean.  Call with any questions or concerns.   04/04/18 [id number]           Signed: Anastasio Auerbach. Babish   PA-C  04/08/2018, 11:34 PM

## 2018-04-10 ENCOUNTER — Ambulatory Visit: Payer: Self-pay | Admitting: *Deleted

## 2018-04-10 ENCOUNTER — Other Ambulatory Visit: Payer: Self-pay | Admitting: *Deleted

## 2018-04-10 ENCOUNTER — Encounter: Payer: Self-pay | Admitting: *Deleted

## 2018-04-10 ENCOUNTER — Other Ambulatory Visit: Payer: Self-pay

## 2018-04-10 NOTE — Patient Outreach (Signed)
Transition of care call   Referral received:  Initial outreach: 04/10/2018 Insurance: [employer] [place]   Subjective: Initial successful telephone call to patient's preferred number in order to complete transition of care assessment; 2 HIPAA identifiers verified. Explained purpose of call and completed transition of care assessment. Assessment completed.  Ms  Forren was hospitalized at [facility] from 3/3-04/04/2018 forTOTAL HIP ARTHROPLASTY ANTERIOR APPROACH (Left). She was discharged to home on 04/04/18 without the need for home health services or DME. States she is doing well, denies post op problems, states surgical pain has been intense but is managed with prescribed medications, tolerating  Diet, denies bowel or bladder problems.     Plan:  No ongoing care management needs identified so will close case to [facility] Care Management services and route successful outreach letter with [facility] Care Management pamphlet and 24 Hour Nurse [employer].  Zara Council. Burgess Estelle, MSN, [facility] Gerontological Nurse Practitioner [facility] Care Management [phone]

## 2018-04-18 MED FILL — CYCLOBENZAPRINE 10 MG TAB: 10 | 10 days supply | Qty: 30 | Fill #0 | Status: TO

## 2018-04-18 MED FILL — CIPROFLOXACIN HCL 500 MG TA: 500 | 5 days supply | Qty: 10 | Fill #0

## 2018-05-07 DIAGNOSIS — R7303 Prediabetes: Secondary | ICD-10-CM | POA: Diagnosis not present

## 2018-05-07 DIAGNOSIS — M12851 Other specific arthropathies, not elsewhere classified, right hip: Secondary | ICD-10-CM | POA: Diagnosis not present

## 2018-05-07 DIAGNOSIS — M609 Myositis, unspecified: Secondary | ICD-10-CM | POA: Diagnosis not present

## 2018-05-15 DIAGNOSIS — M545 Low back pain: Secondary | ICD-10-CM | POA: Diagnosis not present

## 2018-05-15 DIAGNOSIS — Z471 Aftercare following joint replacement surgery: Secondary | ICD-10-CM | POA: Diagnosis not present

## 2018-05-15 DIAGNOSIS — Z96642 Presence of left artificial hip joint: Secondary | ICD-10-CM | POA: Diagnosis not present

## 2018-05-15 DIAGNOSIS — M25562 Pain in left knee: Secondary | ICD-10-CM | POA: Diagnosis not present

## 2018-06-03 ENCOUNTER — Telehealth: Payer: Self-pay

## 2018-06-03 NOTE — Telephone Encounter (Signed)
Virtual Visit Pre-Appointment Phone Call  "(Laurie [name]), I am calling you today to discuss your upcoming appointment. We are currently trying to limit exposure to the virus that causes COVID-19 by seeing patients at home rather than in the office."  1. "What is the BEST phone number to call the day of the visit?" - [phone]  2. "Do you have or have access to (through a family member/friend) a smartphone with video capability that we can use for your visit?" a. If yes - list this number in appt notes as "cell" (if different from BEST phone #) and list the appointment type as a VIDEO visit in appointment notes  3. Confirm consent - "In the setting of the current Covid19 crisis, you are scheduled for a (video) visit with your provider on (May 13 ) at (10:20am).  Just as we do with many in-office visits, in order for you to participate in this visit, we must obtain consent.  If you'd like, I can send this to your mychart (if signed up) or email for you to review.  Otherwise, I can obtain your verbal consent now.  All virtual visits are billed to your insurance company just like a normal visit would be.  By agreeing to a virtual visit, we'd like you to understand that the technology does not allow for your provider to perform an examination, and thus may limit your provider's ability to fully assess your condition. If your provider identifies any concerns that need to be evaluated in person, we will make arrangements to do so.  Finally, though the technology is pretty good, we cannot assure that it will always work on either your or our end, and in the setting of a video visit, we may have to convert it to a phone-only visit.  In either situation, we cannot ensure that we have a secure connection.  Are you willing to proceed?" STAFF: Did the patient verbally acknowledge consent to telehealth visit? Document YES/NO here: YES  4. Advise patient to be prepared - "Two hours prior to your  appointment, go ahead and check your blood pressure, pulse, oxygen saturation, and your weight (if you have the equipment to check those) and write them all down. When your visit starts, your provider will ask you for this information. If you have an Apple Watch or Kardia device, please plan to have heart rate information ready on the day of your appointment. Please have a pen and paper handy nearby the day of the visit as well."  5. Give patient instructions for MyChart download to smartphone OR Doximity/Doxy.me as below if video visit (depending on what platform provider is using)  6. Inform patient they will receive a phone call 15 minutes prior to their appointment time (may be from unknown caller ID) so they should be prepared to answer    TELEPHONE CALL NOTE  Laurie [name] has [name] deemed a candidate for a follow-up tele-health visit to limit community exposure during the Covid-19 pandemic. I spoke with the patient via phone to ensure availability of phone/video source, confirm preferred email & phone number, and discuss instructions and expectations.  I reminded Laurie [name] to be prepared with any vital sign and/or heart rhythm information that could potentially be obtained via home monitoring, at the time of her visit. I reminded Laurie [name] to expect a phone call prior to her visit.  Benjamine Mola, CMA 06/03/2018 4:27 PM   INSTRUCTIONS FOR DOWNLOADING THE MYCHART APP  TO SMARTPHONE  - The patient must first make sure to have activated MyChart and know their login information - If Apple, go to [employer] and type in MyChart in the search bar and download the app. If Android, ask patient to go to [employer] and type in [place] in the search bar and download the app. The app is free but as with any other app downloads, their phone may require them to verify saved payment information or Apple/Android password.  - The patient will need to then log into the app with their  MyChart username and password, and select  as their healthcare provider to link the account. When it is time for your visit, go to the MyChart app, find appointments, and click Begin Video Visit. Be sure to Select Allow for your device to access the Microphone and Camera for your visit. You will then be connected, and your provider will be with you shortly.  **If they have any issues connecting, or need assistance please contact MyChart service desk [phone])**  **If using a computer, in order to ensure the best quality for their visit they will need to use either of the following Internet Browsers: [employer], or Google Chrome**  IF USING DOXIMITY or DOXY.ME - The patient will receive a link just prior to their visit by text.     FULL LENGTH CONSENT FOR TELE-HEALTH VISIT   I hereby voluntarily request, consent and authorize [facility] and its employed or contracted physicians, physician assistants, nurse practitioners or other licensed health care professionals (the Practitioner), to provide me with telemedicine health care services (the "Services") as deemed necessary by the treating Practitioner. I acknowledge and consent to receive the Services by the Practitioner via telemedicine. I understand that the telemedicine visit will involve communicating with the Practitioner through live audiovisual communication technology and the disclosure of certain medical information by electronic transmission. I acknowledge that I have [name] given the opportunity to request an in-person assessment or other available alternative prior to the telemedicine visit and am voluntarily participating in the telemedicine visit.  I understand that I have the right to withhold or withdraw my consent to the use of telemedicine in the course of my care at any time, without affecting my right to future care or treatment, and that the Practitioner or I may terminate the telemedicine visit at  any time. I understand that I have the right to inspect all information obtained and/or recorded in the course of the telemedicine visit and may receive copies of available information for a reasonable fee.  I understand that some of the potential risks of receiving the Services via telemedicine include:  Marland Kitchen Delay or interruption in medical evaluation due to technological equipment failure or disruption; . Information transmitted may not be sufficient (e.g. poor resolution of images) to allow for appropriate medical decision making by the Practitioner; and/or  . In rare instances, security protocols could fail, causing a breach of personal health information.  Furthermore, I acknowledge that it is my responsibility to provide information about my medical history, conditions and care that is complete and accurate to the best of my ability. I acknowledge that Practitioner's advice, recommendations, and/or decision may be based on factors not within their control, such as incomplete or inaccurate data provided by me or distortions of diagnostic images or specimens that may result from electronic transmissions. I understand that the practice of medicine is not an exact science and that Practitioner makes no warranties  or guarantees regarding treatment outcomes. I acknowledge that I will receive a copy of this consent concurrently upon execution via email to the email address I last provided but may also request a printed copy by calling the office of [facility].    I understand that my insurance will be billed for this visit.   I have read or had this consent read to me. . I understand the contents of this consent, which adequately explains the benefits and risks of the Services being provided via telemedicine.  . I have [name] provided ample opportunity to ask questions regarding this consent and the Services and have had my questions answered to my satisfaction. . I give my informed consent for the  services to be provided through the use of telemedicine in my medical care  By participating in this telemedicine visit I agree to the above.

## 2018-06-06 DIAGNOSIS — M12851 Other specific arthropathies, not elsewhere classified, right hip: Secondary | ICD-10-CM | POA: Diagnosis not present

## 2018-06-06 DIAGNOSIS — R7303 Prediabetes: Secondary | ICD-10-CM | POA: Diagnosis not present

## 2018-06-06 DIAGNOSIS — M609 Myositis, unspecified: Secondary | ICD-10-CM | POA: Diagnosis not present

## 2018-06-06 DIAGNOSIS — J302 Other seasonal allergic rhinitis: Secondary | ICD-10-CM | POA: Diagnosis not present

## 2018-06-12 ENCOUNTER — Encounter: Payer: Self-pay | Admitting: Cardiology

## 2018-06-12 ENCOUNTER — Telehealth: Payer: Self-pay | Admitting: *Deleted

## 2018-06-12 ENCOUNTER — Telehealth (INDEPENDENT_AMBULATORY_CARE_PROVIDER_SITE_OTHER): Payer: BLUE CROSS/BLUE SHIELD | Admitting: Cardiology

## 2018-06-12 DIAGNOSIS — R002 Palpitations: Secondary | ICD-10-CM

## 2018-06-12 DIAGNOSIS — I83893 Varicose veins of bilateral lower extremities with other complications: Secondary | ICD-10-CM

## 2018-06-12 DIAGNOSIS — R079 Chest pain, unspecified: Secondary | ICD-10-CM

## 2018-06-12 NOTE — Patient Instructions (Addendum)
Medication Instructions:  None  If you need a refill on your cardiac medications before your next appointment, please call your pharmacy.   Lab work: None If you have labs (blood work) drawn today and your tests are completely normal, you will receive your results only by: Marland Kitchen MyChart Message (if you have MyChart) OR . A paper copy in the mail If you have any lab test that is abnormal or we need to change your treatment, we will call you to review the results.  Testing/Procedures: None  Follow-Up: At [facility], you and your health needs are our priority.  As part of our continuing mission to provide you with exceptional heart care, we have created designated Provider Care Teams.  These Care Teams include your primary Cardiologist (physician) and Advanced Practice Providers (APPs -  Physician Assistants and Nurse Practitioners) who all work together to provide you with the care you need, when you need it. . You will need a follow up appointment in   12 months- MAY 2021.  Please call our office 2 months in advance to schedule this appointment.  You may see Bryan Lemma, MD or one of the following Advanced Practice Providers on your designated Care Team:   . Raffaella Rosenbloom, PA-C . [name], DNP, ANP  Any Other Special Instructions Will Be Listed Below (If Applicable).

## 2018-06-12 NOTE — Assessment & Plan Note (Signed)
Well-controlled on current dose of Toprol.  I explained to her how this works, and she is happy with the results. They do seem to be triggered by stress and anxiety making beta-blocker a good option.

## 2018-06-12 NOTE — Assessment & Plan Note (Signed)
Her swelling seems to be pretty well controlled with support stockings and foot elevation. At this point I do not think we need to consider using a diuretic.

## 2018-06-12 NOTE — Progress Notes (Signed)
Virtual Visit via Video Note   This visit type was conducted due to national recommendations for restrictions regarding the COVID-19 Pandemic (e.g. social distancing) in an effort to limit this patient's exposure and mitigate transmission in our community.  Due to her co-morbid illnesses, this patient is at least at moderate risk for complications without adequate follow up.  This format is felt to be most appropriate for this patient at this time.  All issues noted in this document were discussed and addressed.  A limited physical exam was performed with this format.  Please refer to the patient's chart for her consent to telehealth for [facility].   Patient has given verbal permission to conduct this visit via virtual appointment and to bill insurance 06/03/2018 4: 31 p.m.  Evaluation Performed:  Follow-up visit  Date:  06/12/2018   ID:  Laurie [name], DOB 03/15/1965, MRN [id number]  Patient Location: Home Provider Location: Home  PCP:  Fleet Contras, MD  Cardiologist:  Bryan Lemma, MD  Electrophysiologist:  None   Chief Complaint:  Post-OP f/u (~ 3 month fu)  History of Present Illness:    Laurie [name] is a 53 y.o. female with PMH notable for hypertension (hypertensive heart disease with diastolic dysfunction) as well as sinus tachycardia who presents via audio/video conferencing for a telehealth visit today.  Laurie [name] was last seen by me in June and July 2017.   She has had a several episodes of heart racing associate with shortness of breath and cough.  Drinking caffeine and cut down on tea.  There is no associated syncope or near syncope.  She was initially seen by Dr. Sherril Croon [facility]) & was given a diagnosis of hypertrophic cardiomyopathy based on echocardiogram showing mild to moderate concentric LVH with mild diffuse hypokinesis and was started on low-dose metoprolol.  She was not happy with this diagnosis and asked for second opinion.  I saw  her June 2017 --> we will order a treadmill stress echo that was negative.  EF was 55%.  No suggestion of LVH. --> She continued to note dyspnea with rest or exertion.  Noted that palpitations had improved with metoprolol.  Was concerned about large breast size and weight.  We discussed gastric bypass surgery and potential breast reduction surgery.  She was most recently seen by [name], NP for preop evaluation for total hip replacement by Dr. Charlann Boxer --> with no active symptoms and only exertion being limited by hip pain, she is felt to be low risk.  Interval History:  Laurie [name] is doing fairly well.  She is still not Recovered from her surgery that was on 3 March.  Unfortunately, because of COVID-19, they did not have rehab available so she is trying to do her rehabilitation at home.  She is starting to do a little more activity, but is noting the discomfort in her hip is keeping her from doing a lot.  She says is coming along but slowly.  Somewhat deconditioned and has gained maybe 10pounds because of inactivity.  She still has her episodic dyspnea but is really not associated with anything in particular.  She can wake up with it, she can notice it when sitting, she can notice it with walking.  She is not having the palpitations or rapid heart rate spells that she was having.  She thinks that the shortness of breath is probably related to congestion from allergies.    She said that she still has foot and ankle  swelling, but is pretty well controlled.  She likes her home support stockings better than the once in the hospital.  She wears them routinely and does do foot elevation.  Cardiovascular ROS: positive for - dyspnea on exertion, edema and She thinks this is probably more related to deconditioning, did not notice it pre-hip surgery. negative for - chest pain, irregular heartbeat, orthopnea, palpitations, paroxysmal nocturnal dyspnea, rapid heart rate, shortness of breath or Syncope/near  syncope, TIA shows amaurosis fugax.  Melena, hematochezia hematuria epistaxis.  The patient does not have symptoms concerning for COVID-19 infection (fever, chills, cough, or new shortness of breath).  The patient is practicing social distancing.  Has not been able to go back to work postop.  She thinks that the reduced anxiety from being out of work in the stressful environment and ER has helped her palpitations and shortness of breath spells.  ROS:  Please see the history of present illness.    Review of Systems  Constitutional: Positive for malaise/fatigue (has not started exercising much post-op -- still hurts;; No Rehab b/c COVID). Negative for chills, fever and weight loss (gain).  HENT: Positive for congestion.   Respiratory: Positive for shortness of breath (related to congestion from allergies). Negative for cough.   Cardiovascular: Positive for leg swelling (stable).  Gastrointestinal: Negative for abdominal pain, blood in stool, heartburn and melena.  Genitourinary: Negative for dysuria and hematuria.  Musculoskeletal: Positive for joint pain (Hip still hurts  - but less.  getting better.).  Neurological: Negative for dizziness and focal weakness.  Psychiatric/Behavioral: The patient is nervous/anxious.     Past Medical History:  Diagnosis Date   Acne vulgaris 01/19/15   Allergic rhinitis    Borderline diabetes 11/14/11   Eczematous dermatitis 11/08/2010   eczema   Endometriosis    Esophageal reflux    Fibromyalgia    Gout 02/08/12   idopathic gout, left ankle and foot   Gynecomastia 11/18/13   hypertrophy of breast   Obesity, unspecified 07/04/05   Osteoarthritis    Right hip and knee   Paresthesias/numbness 11/14/11   Pre-diabetes    Premature menopause 07/09/13   Sinus tachycardia    Varicose veins of bilateral lower extremities with other complications 04/26/2015   Spider veins of both lower extremities with edema.   Past Surgical History:    Procedure Laterality Date   Cardiac Event Monitor  03/2015   [facility]: Sinus rhythm with occasional sinus tachycardia. Rates as high as 122. No arrhythmias seen.   JOINT REPLACEMENT Right 05/23/2006   LIPOSUCTION  2014   TOTAL HIP ARTHROPLASTY Right 05/23/2006   TOTAL HIP ARTHROPLASTY Left 04/02/2018   Procedure: TOTAL HIP ARTHROPLASTY ANTERIOR APPROACH;  Surgeon: Durene Romans, MD;  Location: [facility];  Service: Orthopedics;  Laterality: Left;   TRANSTHORACIC ECHOCARDIOGRAM  03/02/2015   [facility], Dr. Sherril Croon: Normal LV size, mild-moderate concentric LVH with mild diffuse HK. EF~50%. Mild LA dilation. Could not exclude small ASD/PFO due to atrial septal dropout. Mild TR, mild MR.   TREADMILL STRESS ECHOCARDIOGRAM  07/2015   Exercised for 7 minutes-8.5 METS.  Terminated due to fatigue and reaching target heart rate -171 bpm which is under percent of expected).  No chest pain.  EKG negative for ischemia.  EF normal at baseline (55%) improved to 65% with exertion..  Baseline echo did not show evidence of LVH or R WMA.     Current Meds  Medication Sig   acetaminophen (TYLENOL) 500 MG tablet Take 2 tablets (  1,000 mg total) by mouth every 8 (eight) hours.   allopurinol (ZYLOPRIM) 100 MG tablet Take 100 mg by mouth daily.   Biotin (BIOTIN 5000) 5 MG CAPS Take 5 mg by mouth daily.    calcium-vitamin D (OSCAL WITH D) 500-200 MG-UNIT tablet Take 1 tablet by mouth daily with breakfast.   ferrous sulfate (FERROUSUL) 325 (65 FE) MG tablet Take 1 tablet (325 mg total) by mouth 3 (three) times daily with meals.   fluticasone (FLONASE) 50 MCG/ACT nasal spray Place 2 sprays into both nostrils daily as needed for allergies.   folic acid (FOLVITE) 1 MG tablet Take 1 mg by mouth daily.   gabapentin (NEURONTIN) 300 MG capsule Take 300 mg by mouth 2 (two) times daily.   lubiprostone (AMITIZA) 24 MCG capsule Take 24 mcg by mouth daily as needed for constipation.     metoprolol succinate (TOPROL-XL) 25 MG 24 hr tablet Take 1 tablet (25 mg total) by mouth daily.   Multiple Vitamins-Minerals (MULTIVITAMINS THER. W/MINERALS) TABS Take 1 tablet by mouth daily.     Wheat Dextrin (BENEFIBER DRINK MIX PO) Take 1 Scoop by mouth daily.     Allergies:   Sulfa antibiotics and Sulfasalazine   Social History   Tobacco Use   Smoking status: Never Smoker   Smokeless tobacco: Never Used  Substance Use Topics   Alcohol use: No    Alcohol/week: 0.0 standard drinks   Drug use: No     Family Hx: The patient's family history includes Breast cancer in her maternal aunt; Cancer in her mother; Healthy in her father; Heart failure in her maternal aunt.   Prior CV studies:   The following studies were reviewed today:  TREADMILL STRESS ECHOCARDIOGRAM July 2017: o Exercised for 7 minutes-8.5 METS.  Terminated due to fatigue and reaching target heart rate -171 bpm which is under percent of expected).  No chest pain.  EKG negative for ischemia.  EF normal at baseline (55%) improved to 65% with exertion. o Baseline echocardiogram did not show evidence of LVH or regional wall motion abnormality.  Labs/Other Tests and Data Reviewed:    EKG:  No ECG reviewed.  Recent Labs: 04/04/2018: BUN 18; Creatinine, Ser 0.81; Hemoglobin 9.8; Platelets 223; Potassium 4.0; Sodium 135   Recent Lipid Panel No results found for: CHOL, TRIG, HDL, CHOLHDL, LDLCALC, LDLDIRECT  Wt Readings from Last 3 Encounters:  04/02/18 220 lb 14.4 oz (100.2 kg)  03/25/18 221 lb (100.2 kg)  03/12/18 219 lb 6.4 oz (99.5 kg)   - probably has gained wgt since Hip Sgx.   Objective:    Vital Signs:  LMP 07/14/2013  - unable to check BP. VITAL SIGNS:  reviewed GEN:  Well nourished, well developed female in no acute distress. Obese RESPIRATORY:  normal respiratory effort, symmetric expansion CARDIOVASCULAR:  mild edema - controlled with foot elevation & support stockings NEURO:  alert and oriented  x 3, no obvious focal deficit PSYCH:  normal affect   ASSESSMENT & PLAN:    Problem List Items Addressed This Visit    Chest pain with minimal risk for cardiac etiology    No longer really having chest pain.  She still has the intermittent shortness of breath spells which I do not think are now related to cardiac etiology.      Palpitations - Primary (Chronic)    Well-controlled on current dose of Toprol.  I explained to her how this works, and she is happy with the results. They do seem  to be triggered by stress and anxiety making beta-blocker a good option.      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 At this point I do not think we need to consider using a diuretic.         COVID-19 Education: The signs and symptoms of COVID-19 were discussed with the patient and how to seek care for testing (follow up with PCP or arrange E-visit).   The importance of social distancing was discussed today.  Time:   Today, I have spent 18minutes with the patient with telehealth technology discussing the above problems.     Medication Adjustments/Labs and Tests Ordered: Current medicines are reviewed at length with the patient today.  Concerns regarding medicines are outlined above.  Medication Instructions:   No change  Tests Ordered: No orders of the defined types were placed in this encounter.  None  Medication Changes: No orders of the defined types were placed in this encounter.  Continue beta-blocker  Disposition:  Follow up in 1 year(s)    Signed, Bryan Lemmaavid Tricia Pledger, MD  06/12/2018 11:10 AM    [facility]

## 2018-06-12 NOTE — Assessment & Plan Note (Signed)
No longer really having chest pain.  She still has the intermittent shortness of breath spells which I do not think are now related to cardiac etiology.

## 2018-06-12 NOTE — Telephone Encounter (Signed)
Spoke to patient - instruction for tel visit 5/12 given. avs summary will be sent via mychart. Patient voiced understanding.

## 2018-06-26 DIAGNOSIS — Z471 Aftercare following joint replacement surgery: Secondary | ICD-10-CM | POA: Diagnosis not present

## 2018-06-26 DIAGNOSIS — Z96642 Presence of left artificial hip joint: Secondary | ICD-10-CM | POA: Diagnosis not present

## 2018-07-23 ENCOUNTER — Emergency Department
Admission: EM | Admit: 2018-07-23 | Discharge: 2018-07-23 | Disposition: A | Discharge status: Home / Self Care | Payer: BC Managed Care – PPO | Attending: Emergency Medicine | Admitting: Emergency Medicine

## 2018-07-23 ENCOUNTER — Encounter: Payer: Self-pay | Admitting: Intensive Care

## 2018-07-23 ENCOUNTER — Other Ambulatory Visit: Payer: Self-pay

## 2018-07-23 DIAGNOSIS — Z79899 Other long term (current) drug therapy: Secondary | ICD-10-CM | POA: Diagnosis not present

## 2018-07-23 DIAGNOSIS — Z96642 Presence of left artificial hip joint: Secondary | ICD-10-CM | POA: Diagnosis not present

## 2018-07-23 DIAGNOSIS — R220 Localized swelling, mass and lump, head: Secondary | ICD-10-CM | POA: Diagnosis not present

## 2018-07-23 DIAGNOSIS — T783XXA Angioneurotic edema, initial encounter: Secondary | ICD-10-CM | POA: Insufficient documentation

## 2018-07-23 DIAGNOSIS — R7303 Prediabetes: Secondary | ICD-10-CM | POA: Diagnosis not present

## 2018-07-23 DIAGNOSIS — Z96641 Presence of right artificial hip joint: Secondary | ICD-10-CM | POA: Diagnosis not present

## 2018-07-23 DIAGNOSIS — L509 Urticaria, unspecified: Secondary | ICD-10-CM | POA: Insufficient documentation

## 2018-07-23 DIAGNOSIS — L299 Pruritus, unspecified: Secondary | ICD-10-CM | POA: Diagnosis not present

## 2018-07-23 MED ORDER — FAMOTIDINE 20 MG PO TABS: 20.0000 mg | ORAL_TABLET | Freq: Two times a day (BID) | ORAL | 0 refills | Status: DC

## 2018-07-23 MED ORDER — FAMOTIDINE IN NACL 20-0.9 MG/50ML-% IV SOLN
20.0000 mg | Freq: Once | INTRAVENOUS | Status: AC
  Administered 2018-07-23: 20 mg via INTRAVENOUS
  Filled 2018-07-23: qty 50

## 2018-07-23 MED ORDER — DEXAMETHASONE SODIUM PHOSPHATE 10 MG/ML IJ SOLN
10.0000 mg | Freq: Once | INTRAMUSCULAR | Status: AC
  Administered 2018-07-23: 10 mg via INTRAVENOUS
  Filled 2018-07-23: qty 1

## 2018-07-23 MED ORDER — PREDNISONE 10 MG (21) PO TBPK: ORAL_TABLET | ORAL | 0 refills | Status: DC

## 2018-07-23 MED ORDER — DIPHENHYDRAMINE HCL 50 MG/ML IJ SOLN
25.0000 mg | Freq: Once | INTRAMUSCULAR | Status: AC
  Administered 2018-07-23: 25 mg via INTRAVENOUS
  Filled 2018-07-23: qty 1

## 2018-07-23 NOTE — Discharge Instructions (Addendum)
Call and make an appointment with Dr. Kathyrn Sheriff if any continued problems.  Return to the ED immediately if any severe worsening of your symptoms.  Begin taking medication as directed.  The prednisone is a tapering dose and Pepcid is twice a day.  You may take Benadryl with this medication if needed for itching.  Avoid places where you may be sweating as this will make the itching worse.  Also be aware that the prednisone may cause a temporary change in her sleep pattern, eating pattern, or cause irritability.

## 2018-07-23 NOTE — ED Triage Notes (Signed)
Patient has swelling to upper lip and whelps all over body. Patient is unsure what is causing reaction. Only new medicine is diclofenac last week and a half.

## 2018-07-23 NOTE — ED Provider Notes (Signed)
[facility] Emergency Department Provider Note  ____________________________________________   First MD Initiated Contact with Patient 07/23/18 1213     (approximate)  I have reviewed the triage vital signs and the nursing notes.   HISTORY  Chief Complaint Allergic Reaction   HPI Laurie [name] is a 53 y.o. female resents to the ED with complaint of "itching all over and lip swollen".  Patient has never had a reaction like this before and is unsure what is causing it presently.  Patient states that she started on Diclofenac  a week and a half ago and that is the only change that she has made.  She denies any difficulty speaking, swallowing or breathing.  He has not taken any medication prior to arrival.     Past Medical History:  Diagnosis Date  . Acne vulgaris 01/19/15  . Allergic rhinitis   . Borderline diabetes 11/14/11  . Eczematous dermatitis 11/08/2010   eczema  . Endometriosis   . Esophageal reflux   . Fibromyalgia   . Gout 02/08/12   idopathic gout, left ankle and foot  . Gynecomastia 11/18/13   hypertrophy of breast  . Obesity, unspecified 07/04/05  . Osteoarthritis    Right hip and knee  . Paresthesias/numbness 11/14/11  . Pre-diabetes   . Premature menopause 07/09/13  . Sinus tachycardia   . 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    Patient Active Problem List   Diagnosis Date Noted  . Obese 04/03/2018  . S/P left THA 04/02/2018  . Status post left hip replacement 04/02/2018  . Primary osteoarthritis of both hands 02/16/2017  . Primary osteoarthritis of both feet 02/16/2017  . Primary osteoarthritis of both knees 02/16/2017  . 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 Unilateral primary osteoarthritis, left hip 02/16/2017  . Polyarthralgia 09/07/2016  . Other fatigue 09/07/2016  . Other insomnia 09/07/2016  .  Gout of left foot 09/07/2016  . ANA positive 09/07/2016  . Chest pain with minimal risk for cardiac etiology 08/28/2015  . Palpitations 08/28/2015  . Allergic rhinitis   . Endometriosis   . Arthritis of right hip   . Fluid overload   . Fibromyalgia   . Sinus tachycardia   . Obesity, unspecified   . Eczematous dermatitis   . Paresthesias/numbness   . Esophageal reflux   . Edema of both legs   . Shortness of breath 06/21/2015  . Varicose veins of bilateral lower extremities with other complications 04/26/2015  . Spider veins of both lower extremities 04/12/2015  . Gynecomastia 11/18/2013  . Premature menopause 07/09/2013  . Gout 02/08/2012  . Menopause 11/14/2011    Past Surgical History:  Procedure Laterality Date  . Cardiac Event Monitor  03/2015   [facility]: Sinus rhythm with occasional sinus tachycardia. Rates as high as 122. No arrhythmias seen.  [facility] Kitchen. JOINT REPLACEMENT Right 05/23/2006  . LIPOSUCTION  2014  . TOTAL HIP ARTHROPLASTY Right 05/23/2006  . TOTAL HIP ARTHROPLASTY Left 04/02/2018   Procedure: TOTAL HIP ARTHROPLASTY ANTERIOR APPROACH;  Surgeon: Durene Romanslin, Matthew, MD;  Location: [facility];  Service: Orthopedics;  Laterality: Left;  . TRANSTHORACIC ECHOCARDIOGRAM  03/02/2015   [facility], Dr. Sherril CroonVyas: Normal LV size, mild-moderate concentric LVH with mild diffuse HK. EF~50%. Mild LA dilation. Could not exclude small ASD/PFO due to atrial septal dropout. Mild TR, mild MR.  [facility]land Kitchen. TREADMILL STRESS ECHOCARDIOGRAM  07/2015   Exercised  for 7 minutes-8.5 METS.  Terminated due to fatigue and reaching target heart rate -171 bpm which is under percent of expected).  No chest pain.  EKG negative for ischemia.  EF normal at baseline (55%) improved to 65% with exertion..  Baseline echo did not show evidence of LVH or R WMA.    Prior to Admission medications   Medication Sig Start Date End Date Taking? Authorizing Provider  diclofenac (VOLTAREN) 75 MG EC tablet Take 75 mg  by mouth 2 (two) times daily.   Yes [provider]  acetaminophen (TYLENOL) 500 MG tablet Take 2 tablets (1,000 mg total) by mouth every 8 (eight) hours. 04/03/18   Lanney GinsBabish, Matthew, PA-C  allopurinol (ZYLOPRIM) 100 MG tablet Take 100 mg by mouth daily. 07/28/14   [provider]  Biotin (BIOTIN 5000) 5 MG CAPS Take 5 mg by mouth daily.     [provider]  calcium-vitamin D (OSCAL WITH D) 500-200 MG-UNIT tablet Take 1 tablet by mouth daily with breakfast.    [provider]  famotidine (PEPCID) 20 MG tablet Take 1 tablet (20 mg total) by mouth 2 (two) times daily. 07/23/18 07/23/19  Tommi RumpsSummers, Natasja L, PA-C  ferrous sulfate (FERROUSUL) 325 (65 FE) MG tablet Take 1 tablet (325 mg total) by mouth 3 (three) times daily with meals. 04/03/18   Lanney GinsBabish, Matthew, PA-C  fluticasone (FLONASE) 50 MCG/ACT nasal spray Place 2 sprays into both nostrils daily as needed for allergies. 01/01/18   [provider]  folic acid (FOLVITE) 1 MG tablet Take 1 mg by mouth daily.    [provider]  gabapentin (NEURONTIN) 300 MG capsule Take 300 mg by mouth 2 (two) times daily. 02/13/18   [provider]  lubiprostone (AMITIZA) 24 MCG capsule Take 24 mcg by mouth daily as needed for constipation.     [provider]  metoprolol succinate (TOPROL-XL) 25 MG 24 hr tablet Take 1 tablet (25 mg total) by mouth daily. 08/26/15   Marykay LexHarding, David W, MD  Multiple Vitamins-Minerals (MULTIVITAMINS THER. W/MINERALS) TABS Take 1 tablet by mouth daily.      [provider]  predniSONE (STERAPRED UNI-PAK 21 TAB) 10 MG (21) TBPK tablet Take 6 tablets for 2 days, then decrease to 5 tablets for 2 days, 4 tablets for 2 days, 3 tablets for 2 days, 2 tablets for 2 days, 1 tablet x 2 days 07/23/18   Bridget HartshornSummers, Neelam L, PA-C  Wheat Dextrin (BENEFIBER DRINK MIX PO) Take 1 Scoop by mouth daily.    [provider]    Allergies Sulfa antibiotics and Sulfasalazine  Family  History  Problem Relation Age of Onset  . Healthy Father   . Cancer Mother        leukemia   . Heart failure Maternal Aunt   . Breast cancer Maternal Aunt     Social History Social History   Tobacco Use  . Smoking status: Never Smoker  . Smokeless tobacco: Never Used  Substance Use Topics  . Alcohol use: No    Alcohol/week: 0.0 standard drinks  . Drug use: No    Review of Systems Constitutional: No fever/chills Eyes: No visual changes. ENT: No sore throat.  Upper lip swelling positive. Cardiovascular: Denies chest pain. Respiratory: Denies shortness of breath. Gastrointestinal: No abdominal pain.  No nausea, no vomiting.  Musculoskeletal: Negative for back pain. Skin: Negative for rash. Neurological: Negative for headaches, focal weakness or numbness. ____________________________________________   PHYSICAL EXAM:  VITAL SIGNS: ED Triage  Vitals  Enc Vitals Group     BP 07/23/18 1202 111/73     Pulse Rate 07/23/18 1201 93     Resp 07/23/18 1201 16     Temp 07/23/18 1201 99.1 F (37.3 C)     Temp Source 07/23/18 1201 Oral     SpO2 07/23/18 1201 98 %     Weight 07/23/18 1202 200 lb (90.7 kg)     Height 07/23/18 1202 5\' 7"  (1.702 m)     Head Circumference --      Peak Flow --      Pain Score 07/23/18 1202 6     Pain Loc --      Pain Edu? --      Excl. in [place]? --    Constitutional: Alert and oriented. Well appearing and in no acute distress.  Patient is talking and answering questions appropriately and without any difficulty. Eyes: Conjunctivae are normal.  Head: Atraumatic. Nose: No congestion/rhinnorhea. Mouth/Throat: Mucous membranes are moist.  Oropharynx non-erythematous.  No edema is noted.  Uvula is midline.  Patient is able to swallow saliva without any difficulty.  Patient has no muffled voice. Neck: No stridor.   Cardiovascular: Normal rate, regular rhythm. Grossly normal heart sounds.  Good peripheral circulation. Respiratory: Normal respiratory  effort.  No retractions. Lungs CTAB. Musculoskeletal: Moves upper and lower extremities that any difficulty. Neurologic:  Normal speech and language. No gross focal neurologic deficits are appreciated. No gait instability. Skin:  Skin is warm, dry and intact.  Urticarial rash noted to the trunk and extremities. Psychiatric: Mood and affect are normal. Speech and behavior are normal.  ____________________________________________   LABS (all labs ordered are listed, but only abnormal results are displayed)  Labs Reviewed - No data to display  PROCEDURES  Procedure(s) performed (including Critical Care):  Procedures   ____________________________________________   INITIAL IMPRESSION / ASSESSMENT AND PLAN / ED COURSE  As part of my medical decision making, I reviewed the following data within the electronic MEDICAL RECORD NUMBER Notes from prior ED visits and [place] Controlled Substance Database  52 year old female presents to the ED with onset this morning of rash and upper lip swelling.  Patient denies any previous problems and states that the only medication that is new to her is Voltaren for the last week and a half.  Patient denies any difficulty breathing and is able to speak without difficulty.  Patient was given Pepcid 20 mg IV, Benadryl 25 mg IV and Decadron 10 mg.  Patient was monitored over the course of her ED stay and did not show any respiratory difficulties or wheezing.  At the time of discharge urticarial rash was improving and lips had slightly improved.  Patient was also seen by Dr. Corky Downs.  Patient agrees with discharge planning of prednisone taper, Pepcid twice daily and to continue taking Benadryl as needed for itching.  She is aware that she will need to see [facility].  She was also instructed to return to the ED if any severe worsening of her symptoms or urgent concerns.  ____________________________________________   FINAL CLINICAL IMPRESSION(S) / ED DIAGNOSES  Final  diagnoses:  Urticaria  Angioedema, initial encounter     ED Discharge Orders         Ordered    predniSONE (STERAPRED UNI-PAK 21 TAB) 10 MG (21) TBPK tablet     07/23/18 1556    famotidine (PEPCID) 20 MG tablet  2 times daily     07/23/18 1556  Note:  This document was prepared using Dragon voice recognition software and may include unintentional dictation errors.    Tommi RumpsSummers, Quinita L, PA-C 07/23/18 1621    Jene EveryKinner, Robert, MD 07/23/18 [phone]

## 2018-08-01 MED FILL — MOMETASONE FUROATE 0.1% CRM: 0.1 | 25 days supply | Qty: 60 | Fill #1

## 2018-08-01 MED FILL — CLOTRIMAZOLE-BETAMETHASONE: 1-0.05 | 25 days supply | Qty: 60 | Fill #1

## 2018-08-07 DIAGNOSIS — Z96642 Presence of left artificial hip joint: Secondary | ICD-10-CM | POA: Diagnosis not present

## 2018-08-07 DIAGNOSIS — Z471 Aftercare following joint replacement surgery: Secondary | ICD-10-CM | POA: Diagnosis not present

## 2018-08-16 ENCOUNTER — Other Ambulatory Visit: Payer: Self-pay | Admitting: Internal Medicine

## 2018-08-16 DIAGNOSIS — Z1231 Encounter for screening mammogram for malignant neoplasm of breast: Secondary | ICD-10-CM

## 2018-08-20 MED FILL — CLOTRIMAZOLE-BETAMETHASONE: 1-0.05 | 25 days supply | Qty: 60 | Fill #1

## 2018-08-20 MED FILL — MOMETASONE FUROATE 0.1% CRM: 0.1 | 25 days supply | Qty: 60 | Fill #1

## 2018-08-28 DIAGNOSIS — Z1239 Encounter for other screening for malignant neoplasm of breast: Secondary | ICD-10-CM | POA: Diagnosis not present

## 2018-08-28 DIAGNOSIS — Z01419 Encounter for gynecological examination (general) (routine) without abnormal findings: Secondary | ICD-10-CM | POA: Diagnosis not present

## 2018-09-17 DIAGNOSIS — M9905 Segmental and somatic dysfunction of pelvic region: Secondary | ICD-10-CM | POA: Diagnosis not present

## 2018-09-17 DIAGNOSIS — M5431 Sciatica, right side: Secondary | ICD-10-CM | POA: Diagnosis not present

## 2018-09-17 DIAGNOSIS — M9903 Segmental and somatic dysfunction of lumbar region: Secondary | ICD-10-CM | POA: Diagnosis not present

## 2018-09-17 DIAGNOSIS — M5386 Other specified dorsopathies, lumbar region: Secondary | ICD-10-CM | POA: Diagnosis not present

## 2018-09-23 DIAGNOSIS — M9905 Segmental and somatic dysfunction of pelvic region: Secondary | ICD-10-CM | POA: Diagnosis not present

## 2018-09-23 DIAGNOSIS — M9903 Segmental and somatic dysfunction of lumbar region: Secondary | ICD-10-CM | POA: Diagnosis not present

## 2018-09-23 DIAGNOSIS — M5431 Sciatica, right side: Secondary | ICD-10-CM | POA: Diagnosis not present

## 2018-09-23 DIAGNOSIS — M5386 Other specified dorsopathies, lumbar region: Secondary | ICD-10-CM | POA: Diagnosis not present

## 2018-09-25 DIAGNOSIS — Z471 Aftercare following joint replacement surgery: Secondary | ICD-10-CM | POA: Diagnosis not present

## 2018-09-25 DIAGNOSIS — M545 Low back pain: Secondary | ICD-10-CM | POA: Diagnosis not present

## 2018-09-25 DIAGNOSIS — Z96642 Presence of left artificial hip joint: Secondary | ICD-10-CM | POA: Diagnosis not present

## 2018-09-30 ENCOUNTER — Ambulatory Visit: Payer: BC Managed Care – PPO

## 2018-10-02 ENCOUNTER — Ambulatory Visit
Admission: RE | Admit: 2018-10-02 | Discharge: 2018-10-02 | Disposition: A | Discharge status: Home / Self Care | Payer: BC Managed Care – PPO | Source: Ambulatory Visit | Attending: Internal Medicine | Admitting: Internal Medicine

## 2018-10-02 ENCOUNTER — Other Ambulatory Visit: Payer: Self-pay

## 2018-10-02 DIAGNOSIS — Z1231 Encounter for screening mammogram for malignant neoplasm of breast: Secondary | ICD-10-CM

## 2018-10-04 DIAGNOSIS — M5416 Radiculopathy, lumbar region: Secondary | ICD-10-CM | POA: Diagnosis not present

## 2018-10-16 DIAGNOSIS — M5416 Radiculopathy, lumbar region: Secondary | ICD-10-CM | POA: Diagnosis not present

## 2018-10-18 DIAGNOSIS — M5416 Radiculopathy, lumbar region: Secondary | ICD-10-CM | POA: Diagnosis not present

## 2018-10-21 DIAGNOSIS — M5416 Radiculopathy, lumbar region: Secondary | ICD-10-CM | POA: Diagnosis not present

## 2018-10-23 DIAGNOSIS — M5416 Radiculopathy, lumbar region: Secondary | ICD-10-CM | POA: Diagnosis not present

## 2018-10-28 DIAGNOSIS — M5416 Radiculopathy, lumbar region: Secondary | ICD-10-CM | POA: Diagnosis not present

## 2018-10-30 DIAGNOSIS — M5416 Radiculopathy, lumbar region: Secondary | ICD-10-CM | POA: Diagnosis not present

## 2018-11-13 DIAGNOSIS — M545 Low back pain: Secondary | ICD-10-CM | POA: Diagnosis not present

## 2018-11-13 DIAGNOSIS — Z471 Aftercare following joint replacement surgery: Secondary | ICD-10-CM | POA: Diagnosis not present

## 2018-11-13 DIAGNOSIS — M5416 Radiculopathy, lumbar region: Secondary | ICD-10-CM | POA: Diagnosis not present

## 2018-11-13 DIAGNOSIS — Z96642 Presence of left artificial hip joint: Secondary | ICD-10-CM | POA: Diagnosis not present

## 2018-11-25 DIAGNOSIS — M5416 Radiculopathy, lumbar region: Secondary | ICD-10-CM | POA: Diagnosis not present

## 2018-12-04 DIAGNOSIS — M5136 Other intervertebral disc degeneration, lumbar region: Secondary | ICD-10-CM | POA: Diagnosis not present

## 2018-12-04 DIAGNOSIS — M48062 Spinal stenosis, lumbar region with neurogenic claudication: Secondary | ICD-10-CM | POA: Diagnosis not present

## 2018-12-04 DIAGNOSIS — Z96642 Presence of left artificial hip joint: Secondary | ICD-10-CM | POA: Diagnosis not present

## 2018-12-19 DIAGNOSIS — M545 Low back pain: Secondary | ICD-10-CM | POA: Diagnosis not present

## 2018-12-19 DIAGNOSIS — M5416 Radiculopathy, lumbar region: Secondary | ICD-10-CM | POA: Diagnosis not present

## 2018-12-19 DIAGNOSIS — M4310 Spondylolisthesis, site unspecified: Secondary | ICD-10-CM | POA: Diagnosis not present

## 2018-12-24 DIAGNOSIS — L2089 Other atopic dermatitis: Secondary | ICD-10-CM | POA: Diagnosis not present

## 2018-12-24 DIAGNOSIS — J302 Other seasonal allergic rhinitis: Secondary | ICD-10-CM | POA: Diagnosis not present

## 2018-12-24 DIAGNOSIS — M609 Myositis, unspecified: Secondary | ICD-10-CM | POA: Diagnosis not present

## 2018-12-24 DIAGNOSIS — M169 Osteoarthritis of hip, unspecified: Secondary | ICD-10-CM | POA: Diagnosis not present

## 2019-01-07 DIAGNOSIS — L011 Impetiginization of other dermatoses: Secondary | ICD-10-CM | POA: Diagnosis not present

## 2019-01-07 DIAGNOSIS — L700 Acne vulgaris: Secondary | ICD-10-CM | POA: Diagnosis not present

## 2019-01-07 DIAGNOSIS — L304 Erythema intertrigo: Secondary | ICD-10-CM | POA: Diagnosis not present

## 2019-01-07 DIAGNOSIS — L2089 Other atopic dermatitis: Secondary | ICD-10-CM | POA: Diagnosis not present

## 2019-01-07 DIAGNOSIS — L0889 Other specified local infections of the skin and subcutaneous tissue: Secondary | ICD-10-CM | POA: Diagnosis not present

## 2019-01-30 DIAGNOSIS — Z03818 Encounter for observation for suspected exposure to other biological agents ruled out: Secondary | ICD-10-CM | POA: Diagnosis not present

## 2019-01-30 DIAGNOSIS — R21 Rash and other nonspecific skin eruption: Secondary | ICD-10-CM | POA: Diagnosis not present

## 2019-01-30 DIAGNOSIS — T50995A Adverse effect of other drugs, medicaments and biological substances, initial encounter: Secondary | ICD-10-CM | POA: Diagnosis not present

## 2019-02-17 DIAGNOSIS — M609 Myositis, unspecified: Secondary | ICD-10-CM | POA: Diagnosis not present

## 2019-02-17 DIAGNOSIS — L2089 Other atopic dermatitis: Secondary | ICD-10-CM | POA: Diagnosis not present

## 2019-02-17 DIAGNOSIS — M4310 Spondylolisthesis, site unspecified: Secondary | ICD-10-CM | POA: Diagnosis not present

## 2019-02-17 DIAGNOSIS — J302 Other seasonal allergic rhinitis: Secondary | ICD-10-CM | POA: Diagnosis not present

## 2019-02-17 DIAGNOSIS — M169 Osteoarthritis of hip, unspecified: Secondary | ICD-10-CM | POA: Diagnosis not present

## 2019-02-27 DIAGNOSIS — R000 Tachycardia, unspecified: Secondary | ICD-10-CM | POA: Diagnosis not present

## 2019-02-27 DIAGNOSIS — R7303 Prediabetes: Secondary | ICD-10-CM | POA: Diagnosis not present

## 2019-02-27 DIAGNOSIS — M609 Myositis, unspecified: Secondary | ICD-10-CM | POA: Diagnosis not present

## 2019-02-27 DIAGNOSIS — Z13220 Encounter for screening for lipoid disorders: Secondary | ICD-10-CM | POA: Diagnosis not present

## 2019-02-27 DIAGNOSIS — Z79899 Other long term (current) drug therapy: Secondary | ICD-10-CM | POA: Diagnosis not present

## 2019-02-27 DIAGNOSIS — E559 Vitamin D deficiency, unspecified: Secondary | ICD-10-CM | POA: Diagnosis not present

## 2019-02-27 DIAGNOSIS — N3001 Acute cystitis with hematuria: Secondary | ICD-10-CM | POA: Diagnosis not present

## 2019-02-27 DIAGNOSIS — H0012 Chalazion right lower eyelid: Secondary | ICD-10-CM | POA: Diagnosis not present

## 2019-02-27 DIAGNOSIS — Z0000 Encounter for general adult medical examination without abnormal findings: Secondary | ICD-10-CM | POA: Diagnosis not present

## 2019-03-07 DIAGNOSIS — M5416 Radiculopathy, lumbar region: Secondary | ICD-10-CM | POA: Diagnosis not present

## 2019-03-13 DIAGNOSIS — M609 Myositis, unspecified: Secondary | ICD-10-CM | POA: Diagnosis not present

## 2019-03-13 DIAGNOSIS — M169 Osteoarthritis of hip, unspecified: Secondary | ICD-10-CM | POA: Diagnosis not present

## 2019-03-13 DIAGNOSIS — K219 Gastro-esophageal reflux disease without esophagitis: Secondary | ICD-10-CM | POA: Diagnosis not present

## 2019-03-13 DIAGNOSIS — M179 Osteoarthritis of knee, unspecified: Secondary | ICD-10-CM | POA: Diagnosis not present

## 2019-03-25 DIAGNOSIS — M5416 Radiculopathy, lumbar region: Secondary | ICD-10-CM | POA: Diagnosis not present

## 2019-03-26 DIAGNOSIS — Z96643 Presence of artificial hip joint, bilateral: Secondary | ICD-10-CM | POA: Diagnosis not present

## 2019-03-26 DIAGNOSIS — M5136 Other intervertebral disc degeneration, lumbar region: Secondary | ICD-10-CM | POA: Diagnosis not present

## 2019-03-26 DIAGNOSIS — M47817 Spondylosis without myelopathy or radiculopathy, lumbosacral region: Secondary | ICD-10-CM | POA: Diagnosis not present

## 2019-03-26 DIAGNOSIS — M545 Low back pain: Secondary | ICD-10-CM | POA: Diagnosis not present

## 2019-04-01 DIAGNOSIS — M79609 Pain in unspecified limb: Secondary | ICD-10-CM | POA: Diagnosis not present

## 2019-04-01 DIAGNOSIS — M545 Low back pain: Secondary | ICD-10-CM | POA: Diagnosis not present

## 2019-04-01 DIAGNOSIS — M5416 Radiculopathy, lumbar region: Secondary | ICD-10-CM | POA: Diagnosis not present

## 2019-04-02 DIAGNOSIS — J301 Allergic rhinitis due to pollen: Secondary | ICD-10-CM | POA: Diagnosis not present

## 2019-04-02 DIAGNOSIS — L309 Dermatitis, unspecified: Secondary | ICD-10-CM | POA: Diagnosis not present

## 2019-04-29 DIAGNOSIS — M5386 Other specified dorsopathies, lumbar region: Secondary | ICD-10-CM | POA: Diagnosis not present

## 2019-04-29 DIAGNOSIS — M9905 Segmental and somatic dysfunction of pelvic region: Secondary | ICD-10-CM | POA: Diagnosis not present

## 2019-04-29 DIAGNOSIS — M9903 Segmental and somatic dysfunction of lumbar region: Secondary | ICD-10-CM | POA: Diagnosis not present

## 2019-04-29 DIAGNOSIS — M5431 Sciatica, right side: Secondary | ICD-10-CM | POA: Diagnosis not present

## 2019-05-05 DIAGNOSIS — M5386 Other specified dorsopathies, lumbar region: Secondary | ICD-10-CM | POA: Diagnosis not present

## 2019-05-05 DIAGNOSIS — M9903 Segmental and somatic dysfunction of lumbar region: Secondary | ICD-10-CM | POA: Diagnosis not present

## 2019-05-05 DIAGNOSIS — M9905 Segmental and somatic dysfunction of pelvic region: Secondary | ICD-10-CM | POA: Diagnosis not present

## 2019-05-05 DIAGNOSIS — M5431 Sciatica, right side: Secondary | ICD-10-CM | POA: Diagnosis not present

## 2019-05-12 DIAGNOSIS — M25511 Pain in right shoulder: Secondary | ICD-10-CM | POA: Diagnosis not present

## 2019-05-12 DIAGNOSIS — M179 Osteoarthritis of knee, unspecified: Secondary | ICD-10-CM | POA: Diagnosis not present

## 2019-05-12 DIAGNOSIS — M609 Myositis, unspecified: Secondary | ICD-10-CM | POA: Diagnosis not present

## 2019-05-12 DIAGNOSIS — J302 Other seasonal allergic rhinitis: Secondary | ICD-10-CM | POA: Diagnosis not present

## 2019-05-26 DIAGNOSIS — M9905 Segmental and somatic dysfunction of pelvic region: Secondary | ICD-10-CM | POA: Diagnosis not present

## 2019-05-26 DIAGNOSIS — M5431 Sciatica, right side: Secondary | ICD-10-CM | POA: Diagnosis not present

## 2019-05-26 DIAGNOSIS — M5386 Other specified dorsopathies, lumbar region: Secondary | ICD-10-CM | POA: Diagnosis not present

## 2019-05-26 DIAGNOSIS — M9903 Segmental and somatic dysfunction of lumbar region: Secondary | ICD-10-CM | POA: Diagnosis not present

## 2019-06-06 ENCOUNTER — Ambulatory Visit (INDEPENDENT_AMBULATORY_CARE_PROVIDER_SITE_OTHER): Payer: BC Managed Care – PPO | Admitting: Plastic Surgery

## 2019-06-06 ENCOUNTER — Other Ambulatory Visit: Payer: Self-pay

## 2019-06-06 ENCOUNTER — Encounter: Payer: Self-pay | Admitting: Plastic Surgery

## 2019-06-06 VITALS — BP 155/89 | HR 91 | Temp 97.3°F | Ht 67.0 in | Wt 224.0 lb

## 2019-06-06 DIAGNOSIS — M546 Pain in thoracic spine: Secondary | ICD-10-CM

## 2019-06-06 DIAGNOSIS — M549 Dorsalgia, unspecified: Secondary | ICD-10-CM | POA: Insufficient documentation

## 2019-06-06 DIAGNOSIS — M542 Cervicalgia: Secondary | ICD-10-CM | POA: Diagnosis not present

## 2019-06-06 DIAGNOSIS — N62 Hypertrophy of breast: Secondary | ICD-10-CM

## 2019-06-06 DIAGNOSIS — G8929 Other chronic pain: Secondary | ICD-10-CM

## 2019-06-06 NOTE — Progress Notes (Signed)
Patient ID: Laurie [name], Laurie [name]    DOB: [birth year], 54 y.o.   MRN: [id number]   Chief Complaint  Patient presents with  . Advice Only    for (B) breast reduction    Mammary Hyperplasia: The patient is a 54 y.o. Laurie [name] with a history of mammary hyperplasia for several years.  She has extremely large breasts causing symptoms that include the following: Back pain in the upper and lower back, including neck pain. She pulls or pins her bra straps to provide better lift and relief of the pressure and pain. She notices relief by holding her breast up manually.  Her shoulder straps cause grooves and pain and pressure that requires padding for relief. Pain medication is sometimes required with motrin and tylenol.  Activities that are hindered by enlarged breasts include: exercise and running.  She has severe sciatic problems and back pain.  She has been to a chiropractor and is currently in physical therapy.  So far there is temporary improvement but nothing that is long-lasting in her neck and back pain.  Her breasts are extremely large and fairly symmetric.  She has hyperpigmentation of the inframammary area on both sides.  The sternal to nipple distance on the right is 37 cm and the left is 37 cm.  The IMF distance is 17 cm.  She is 5 feet 7 inches tall and weighs 224 pounds.  Preoperative bra size = 40H cup.  She would like to be either a C or D cup size. the estimated excess breast tissue to be removed at the time of surgery = 700 grams on the left and 700 grams on the right.  Mammogram history: Last mammogram was September 2020 and was negative.  She has had surgery in the past but not on her breast.  She has bilateral hip replacements.  She has a history of obesity and osteoarthritis as well as gout.  She occasionally takes steroids for her pain.  She is not a smoker.   Review of Systems  Constitutional: Positive for activity change. Negative for appetite change.  Hematological:  Positive for adenopathy.    Past Medical History:  Diagnosis Date  . Acne vulgaris 01/19/15  . Allergic rhinitis   . Borderline diabetes 11/14/11  . Eczematous dermatitis 11/08/2010   eczema  . Endometriosis   . Esophageal reflux   . Fibromyalgia   . Gout 02/08/12   idopathic gout, left ankle and foot  . Gynecomastia 11/18/13   hypertrophy of breast  . Obesity, unspecified 07/04/05  . Osteoarthritis    Right hip and knee  . Paresthesias/numbness 11/14/11  . Pre-diabetes   . Premature menopause 07/09/13  . Sinus tachycardia   . 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    Past Surgical History:  Procedure Laterality Date  . Cardiac Event Monitor  03/2015   [facility]: Sinus rhythm with occasional sinus tachycardia. Rates as high as 122. No arrhythmias seen.  Marland Kitchen JOINT REPLACEMENT Right 05/23/2006  . LIPOSUCTION  2014  . TOTAL HIP ARTHROPLASTY Right 05/23/2006  . TOTAL HIP ARTHROPLASTY Left 04/02/2018   Procedure: TOTAL HIP ARTHROPLASTY ANTERIOR APPROACH;  Surgeon: Durene Romans, MD;  Location: [facility];  Service: Orthopedics;  Laterality: Left;  . TRANSTHORACIC ECHOCARDIOGRAM  03/02/2015   [facility], Dr. Sherril Croon: Normal LV size, mild-moderate concentric LVH with mild diffuse HK. EF~50%. Mild LA dilation. Could not exclude small  ASD/PFO due to atrial septal dropout. Mild TR, mild MR.  Marland Kitchen TREADMILL STRESS ECHOCARDIOGRAM  07/2015   Exercised for 7 minutes-8.5 METS.  Terminated due to fatigue and reaching target heart rate -171 bpm which is under percent of expected).  No chest pain.  EKG negative for ischemia.  EF normal at baseline (55%) improved to 65% with exertion..  Baseline echo did not show evidence of LVH or R WMA.      Current Outpatient Medications:  .  acetaminophen (TYLENOL) 500 MG tablet, Take 2 tablets (1,000 mg total) by mouth every 8 (eight) hours.,  Disp: 30 tablet, Rfl: 0 .  allopurinol (ZYLOPRIM) 100 MG tablet, Take 100 mg by mouth daily., Disp: , Rfl:  .  Biotin (BIOTIN 5000) 5 MG CAPS, Take 5 mg by mouth daily. , Disp: , Rfl:  .  calcium-vitamin D (OSCAL WITH D) 500-200 MG-UNIT tablet, Take 1 tablet by mouth daily with breakfast., Disp: , Rfl:  .  fluticasone (FLONASE) 50 MCG/ACT nasal spray, Place 2 sprays into both nostrils daily as needed for allergies., Disp: , Rfl:  .  folic acid (FOLVITE) 1 MG tablet, Take 1 mg by mouth daily., Disp: , Rfl:  .  gabapentin (NEURONTIN) 300 MG capsule, Take 300 mg by mouth 2 (two) times daily., Disp: , Rfl:  .  lubiprostone (AMITIZA) 24 MCG capsule, Take 24 mcg by mouth daily as needed for constipation. , Disp: , Rfl:  .  metoprolol succinate (TOPROL-XL) 25 MG 24 hr tablet, Take 1 tablet (25 mg total) by mouth daily., Disp: 90 tablet, Rfl: 3 .  Multiple Vitamins-Minerals (MULTIVITAMINS THER. W/MINERALS) TABS, Take 1 tablet by mouth daily.  , Disp: , Rfl:  .  Wheat Dextrin (BENEFIBER DRINK MIX PO), Take 1 Scoop by mouth daily., Disp: , Rfl:    Objective:   Vitals:   06/06/19 0908  BP: (!) 155/89  Pulse: 91  Temp: (!) 97.3 F (36.3 C)  SpO2: 99%    Physical Exam Vitals and nursing note reviewed.  Constitutional:      Appearance: Normal appearance.  HENT:     Head: Normocephalic and atraumatic.  Eyes:     Extraocular Movements: Extraocular movements intact.  Cardiovascular:     Rate and Rhythm: Normal rate.     Pulses: Normal pulses.  Pulmonary:     Effort: Pulmonary effort is normal. No respiratory distress.     Breath sounds: No stridor.  Abdominal:     General: Abdomen is flat. There is no distension.     Tenderness: There is no abdominal tenderness.  Skin:    General: Skin is warm.     Capillary Refill: Capillary refill takes less than 2 seconds.  Neurological:     General: No focal deficit present.     Mental Status: She is alert and oriented to person, place, and time.    Psychiatric:        Mood and Affect: Mood normal.        Behavior: Behavior normal.        Thought Content: Thought content normal.     Assessment & Plan:  Symptomatic mammary hypertrophy  Chronic bilateral thoracic back pain  Neck pain  Recommend bilateral breast reduction with lateral liposuction.  We will do a release of information for her physical therapy, chiropractor and mammogram results.  Highly recommend that she not do any steroid treatment between now and surgery.  Also recommend maximizing her nutritional status for improved wound healing.  We discussed some of the risks and complications which include loss of nipple areola sensation, change in pigmentation and asymmetries.  [place], DO

## 2019-06-13 ENCOUNTER — Telehealth: Payer: Self-pay | Admitting: Plastic Surgery

## 2019-06-13 NOTE — Telephone Encounter (Signed)
Left message on patient's voicemail advising that the insurance authorization request has been submitted. We are waiting on her insurance to render a decision, but guidelines allow for up to 30 days for a response. We will contact the patient once a determination has been made on the request.

## 2019-06-22 NOTE — Progress Notes (Signed)
Cardiology Office Note   Date:  06/23/2019   ID:  Charlcie Prisco [place], DOB 1965/02/27, MRN [id number]  PCP:  Fleet Contras, MD  Cardiologist:  Dr. Herbie Baltimore  CC: Pre-op Evaluation    History of Present Illness: Laurie [name] is a 54 y.o. female who presents for ongoing assessment and management of hypertensive heart disease with diastolic dysfunction, hypertension, and sinus tachycardia.  When last evaluated by Dr. Herbie Baltimore via virtual visit on 06/12/2018 the patient complained of several episodes of heart racing with shortness of breath and cough.    Dr. Herbie Baltimore noted that she was initially seen by Dr. Sherril Croon of [facility] and was given diagnosis of hypertrophic cardiomyopathy based on echocardiogram showing mild to moderate concentric LVH with mild diffuse hypokinesis.    At that time she was started on low-dose metoprolol.  However a second opinion by Dr. Herbie Baltimore did not find that the patient had hypertrophic cardiomyopathy with no suggestion of LVH per stress echo in June 2017.  On the last encounter, she was recovering well from her hip surgery and still had some mild episodic dyspnea on exertion.  She was probably deconditioned and gained approximately 10 pounds as she had not been as active with postoperative pain.  But was moving forward with physical therapy.  She continues to wear support hose due to some chronic dependent foot and ankle swelling.  No new labs or testing was planned at that time.  She was seen in the emergency room on 07/23/2018 for an allergic reaction after taking Diclofenac with associated itching all over and swollen lips.  She did not have any issues with breathing swallowing or speaking.  She was placed on H2 blocker twice daily, Benadryl, and on a steroid taper.  She was to follow-up with PCP.  She comes today for cardiac evaluation with possible breast reduction surgery planned.  She has been having a lot of back pain, shortness of breath, neck and  shoulder pain.  Is being followed by orthopedics who have recommended breast reduction surgery.  She is going through the process of insurance approval.  While waiting she is been asked to be seen by cardiology and other specialists prior to being seen by surgery.  Other than generalized fatigue and shortness of breath from inactivity due to chronic back pain shoulder pain related to enlarged breasts, she is without significant complaints.  Blood pressure is well controlled.  She is medically compliant.  Metoprolol is provided by PCP.  She complains of being in a lot of pain with her back and neck and is anxious to have surgery.  She states she went from a cup D to a cup H on steroid therapy.  Past Medical History:  Diagnosis Date  . Acne vulgaris 01/19/15  . Allergic rhinitis   . Borderline diabetes 11/14/11  . Eczematous dermatitis 11/08/2010   eczema  . Endometriosis   . Esophageal reflux   . Fibromyalgia   . Gout 02/08/12   idopathic gout, left ankle and foot  . Gynecomastia 11/18/13   hypertrophy of breast  . Obesity, unspecified 07/04/05  . Osteoarthritis    Right hip and knee  . Paresthesias/numbness 11/14/11  . Pre-diabetes   . Premature menopause 07/09/13  . Sinus tachycardia   . 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    Past Surgical History:  Procedure Laterality Date  . Cardiac Event Monitor  03/2015  [facility]: Sinus rhythm with occasional sinus tachycardia. Rates as high as 122. No arrhythmias seen.  Marland Kitchen JOINT REPLACEMENT Right 05/23/2006  . LIPOSUCTION  2014  . TOTAL HIP ARTHROPLASTY Right 05/23/2006  . TOTAL HIP ARTHROPLASTY Left 04/02/2018   Procedure: TOTAL HIP ARTHROPLASTY ANTERIOR APPROACH;  Surgeon: Durene Romans, MD;  Location: [facility];  Service: Orthopedics;  Laterality: Left;  . TRANSTHORACIC ECHOCARDIOGRAM  03/02/2015   [facility], Dr.  Sherril Croon: Normal LV size, mild-moderate concentric LVH with mild diffuse HK. EF~50%. Mild LA dilation. Could not exclude small ASD/PFO due to atrial septal dropout. Mild TR, mild MR.  Marland Kitchen TREADMILL STRESS ECHOCARDIOGRAM  07/2015   Exercised for 7 minutes-8.5 METS.  Terminated due to fatigue and reaching target heart rate -171 bpm which is under percent of expected).  No chest pain.  EKG negative for ischemia.  EF normal at baseline (55%) improved to 65% with exertion..  Baseline echo did not show evidence of LVH or R WMA.     Current Outpatient Medications  Medication Sig Dispense Refill  . acetaminophen (TYLENOL) 500 MG tablet Take 2 tablets (1,000 mg total) by mouth every 8 (eight) hours. 30 tablet 0  . allopurinol (ZYLOPRIM) 100 MG tablet Take 100 mg by mouth daily.    . Biotin (BIOTIN 5000) 5 MG CAPS Take 5 mg by mouth daily.     . calcium-vitamin D (OSCAL WITH D) 500-200 MG-UNIT tablet Take 1 tablet by mouth daily with breakfast.    . fluticasone (FLONASE) 50 MCG/ACT nasal spray Place 2 sprays into both nostrils daily as needed for allergies.    . folic acid (FOLVITE) 1 MG tablet Take 1 mg by mouth daily.    Marland Kitchen gabapentin (NEURONTIN) 300 MG capsule Take 300 mg by mouth 2 (two) times daily.    Marland Kitchen lubiprostone (AMITIZA) 24 MCG capsule Take 24 mcg by mouth daily as needed for constipation.     . metoprolol succinate (TOPROL-XL) 25 MG 24 hr tablet Take 1 tablet (25 mg total) by mouth daily. 90 tablet 3  . Multiple Vitamins-Minerals (MULTIVITAMINS THER. W/MINERALS) TABS Take 1 tablet by mouth daily.      . Wheat Dextrin (BENEFIBER DRINK MIX PO) Take 1 Scoop by mouth daily.     No current facility-administered medications for this visit.    Allergies:   Sulfa antibiotics and Sulfasalazine    Social History:  The patient  reports that she has never smoked. She has never used smokeless tobacco. She reports that she does not drink alcohol or use drugs.   Family History:  The patient's family history  includes Breast cancer in her maternal aunt; Cancer in her mother; Healthy in her father; Heart failure in her maternal aunt.    ROS: All other systems are reviewed and negative. Unless otherwise mentioned in H&P    PHYSICAL EXAM: VS:  BP 122/72   Pulse 84   Ht 5\' 7"  (1.702 m)   Wt 224 lb 6.4 oz (101.8 kg)   LMP 07/14/2013   BMI 35.15 kg/m  , BMI Body mass index is 35.15 kg/m. GEN: Well nourished, well developed, in no acute distress HEENT: normal Neck: no JVD, carotid bruits, or masses Cardiac: RRR, tachycardic; no murmurs, rubs, or gallops,no edema  Respiratory:  Clear to auscultation bilaterally, normal work of breathing GI: soft, nontender, nondistended, + BS MS: no deformity or atrophy Skin: warm and dry, no rash Neuro:  Strength and sensation are intact Psych: euthymic mood, full affect  EKG: Normal sinus rhythm, heart rate 93 bpm.    Recent Labs: No results found for requested labs within last 8760 hours.    Lipid Panel No results found for: CHOL, TRIG, HDL, CHOLHDL, VLDL, LDLCALC, LDLDIRECT    Wt Readings from Last 3 Encounters:  06/23/19 224 lb 6.4 oz (101.8 kg)  06/06/19 224 lb (101.6 kg)  07/23/18 200 lb (90.7 kg)      Other studies Reviewed: Stress echo 08/18/2015 Stress echo results:   Left ventricular ejection fraction was  normal at rest and with stress. There was no echocardiographic  evidence for stress-induced ischemia.   ASSESSMENT AND PLAN:  1.  Frequent palpitations with heart racing: Remains on metoprolol 25 mg daily.  This has been helpful to her.  Blood pressures well controlled.  She appears stable from a cardiovascular standpoint.  No changes in her regimen.  2.  Preoperative cardiac evaluation Chart reviewed as part of pre-operative protocol coverage. Given past medical history and time since last visit, based on ACC/AHA guidelines, she would be at acceptable risk for the planned procedure without further cardiovascular testing.    3.  Hypertension: Excellent control today.  No changes in her regimen.  4.  Obesity: Patient has been inactive due to chronic back pain and shoulder pain.  She is hopeful that once she has breast reduction surgery she will feel better and begin an exercise regimen.  She is advised on low-sodium low-cholesterol diet.  Current medicines are reviewed at length with the patient today.  I have spent 25 minutes dedicated to the care of this patient on the date of this encounter to include pre-visit review of records, assessment, management and diagnostic testing,with shared decision making.  Labs/ tests ordered today include: None   Phill Myron. [place] Pugh, ANP, AACC   06/23/2019 5:06 PM    [facility] [place] [address] Office [phone] Fax [phone]  Notice: This dictation was prepared with Dragon dictation along with smaller phrase technology. Any transcriptional errors that result from this process are unintentional and may not be corrected upon review.

## 2019-06-23 ENCOUNTER — Other Ambulatory Visit: Payer: Self-pay

## 2019-06-23 ENCOUNTER — Ambulatory Visit (INDEPENDENT_AMBULATORY_CARE_PROVIDER_SITE_OTHER): Payer: BC Managed Care – PPO | Admitting: Adult Health

## 2019-06-23 ENCOUNTER — Encounter: Payer: Self-pay | Admitting: Adult Health

## 2019-06-23 VITALS — BP 122/72 | HR 84 | Ht 67.0 in | Wt 224.4 lb

## 2019-06-23 DIAGNOSIS — R000 Tachycardia, unspecified: Secondary | ICD-10-CM | POA: Diagnosis not present

## 2019-06-23 DIAGNOSIS — M609 Myositis, unspecified: Secondary | ICD-10-CM | POA: Diagnosis not present

## 2019-06-23 DIAGNOSIS — Z01810 Encounter for preprocedural cardiovascular examination: Secondary | ICD-10-CM

## 2019-06-23 DIAGNOSIS — J302 Other seasonal allergic rhinitis: Secondary | ICD-10-CM | POA: Diagnosis not present

## 2019-06-23 DIAGNOSIS — Z6834 Body mass index (BMI) 34.0-34.9, adult: Secondary | ICD-10-CM | POA: Diagnosis not present

## 2019-06-23 DIAGNOSIS — E669 Obesity, unspecified: Secondary | ICD-10-CM | POA: Diagnosis not present

## 2019-06-23 DIAGNOSIS — I10 Essential (primary) hypertension: Secondary | ICD-10-CM | POA: Diagnosis not present

## 2019-06-23 DIAGNOSIS — R7303 Prediabetes: Secondary | ICD-10-CM | POA: Diagnosis not present

## 2019-06-23 NOTE — Patient Instructions (Signed)
Medication Instructions:  Continue current medications  *If you need a refill on your cardiac medications before your next appointment, please call your pharmacy*   Lab Work: None Ordered  Testing/Procedures: None ordered   Follow-Up: At [facility], you and your health needs are our priority.  As part of our continuing mission to provide you with exceptional heart care, we have created designated Provider Care Teams.  These Care Teams include your primary Cardiologist (physician) and Advanced Practice Providers (APPs -  Physician Assistants and Nurse Practitioners) who all work together to provide you with the care you need, when you need it.  We recommend signing up for the patient portal called "MyChart".  Sign up information is provided on this After Visit Summary.  MyChart is used to connect with patients for Virtual Visits (Telemedicine).  Patients are able to view lab/test results, encounter notes, upcoming appointments, etc.  Non-urgent messages can be sent to your provider as well.   To learn more about what you can do with MyChart, go to [email or web address].    Your next appointment:   1 year(s)  The format for your next appointment:   In Person  Provider:   You may see David Harding, MD or one of the following Advanced Practice Providers on your designated Care Team:    Auria Barrett, PA-C  Kathryn Lawrence, DNP, ANP  Cadence Furth, NP    

## 2019-07-31 ENCOUNTER — Encounter: Payer: Self-pay | Admitting: Plastic Surgery

## 2019-07-31 ENCOUNTER — Ambulatory Visit (INDEPENDENT_AMBULATORY_CARE_PROVIDER_SITE_OTHER): Payer: BC Managed Care – PPO | Admitting: Plastic Surgery

## 2019-07-31 ENCOUNTER — Other Ambulatory Visit: Payer: Self-pay

## 2019-07-31 VITALS — BP 128/98 | HR 87 | Temp 97.1°F

## 2019-07-31 DIAGNOSIS — N62 Hypertrophy of breast: Secondary | ICD-10-CM

## 2019-07-31 DIAGNOSIS — M546 Pain in thoracic spine: Secondary | ICD-10-CM

## 2019-07-31 DIAGNOSIS — M542 Cervicalgia: Secondary | ICD-10-CM

## 2019-07-31 DIAGNOSIS — G8929 Other chronic pain: Secondary | ICD-10-CM

## 2019-07-31 MED ORDER — CEPHALEXIN 500 MG PO CAPS: 500.0000 mg | ORAL_CAPSULE | Freq: Four times a day (QID) | ORAL | 0 refills | Status: AC

## 2019-07-31 MED ORDER — ONDANSETRON HCL 4 MG PO TABS: 4.0000 mg | ORAL_TABLET | Freq: Three times a day (TID) | ORAL | 0 refills | Status: DC | PRN

## 2019-07-31 MED ORDER — HYDROCODONE-ACETAMINOPHEN 5-325 MG PO TABS: 1.0000 | ORAL_TABLET | Freq: Three times a day (TID) | ORAL | 0 refills | Status: AC | PRN

## 2019-07-31 NOTE — H&P (View-Only) (Signed)
ICD-10-CM   1. Symptomatic mammary hypertrophy  N62   2. Chronic bilateral thoracic back pain  M54.6    G89.29   3. Neck pain  M54.2       Patient ID: Laurie [name] [facility], female    DOB: 09-Jul-1965, 54 y.o.   MRN: [id number]   History of Present Illness: Laurie [name] is a 54 y.o.  female  with a history of symptomatic mammary hyperplasia.  She presents for preoperative evaluation for upcoming procedure, bilateral breast reduction with liposuction, scheduled for 08/20/2019 with Dr. Ulice Bold.   Summary from previous visit: Patient has large but fairly symmetric breasts.  Sternal to nipple distance on the right is 37 cm and the left is 37 cm.  The IMF distance is 17 cm.  She is 5 feet 7 inches tall and weighs 224 pounds.  Preoperative bra size is a 40H.  She would like to be around a C or D cup.  The estimated excess tissue to be removed at time of surgery equals 700 g from each side.  Job: N/A - disability  PMH Significant for: Bilateral hip replacements, OA, gout, prediabetes, fibromyalgia  The patient has not had problems with anesthesia.   Past Medical History: Allergies: Allergies  Allergen Reactions  . Sulfa Antibiotics Swelling  . Sulfasalazine Swelling    Current Medications:  Current Outpatient Medications:  .  allopurinol (ZYLOPRIM) 100 MG tablet, Take 100 mg by mouth daily., Disp: , Rfl:  .  folic acid (FOLVITE) 1 MG tablet, Take 1 mg by mouth daily. , Disp: , Rfl:  .  gabapentin (NEURONTIN) 300 MG capsule, Take 300 mg by mouth 2 (two) times daily., Disp: , Rfl:  .  lubiprostone (AMITIZA) 24 MCG capsule, Take 24 mcg by mouth daily as needed for constipation. , Disp: , Rfl:  .  metoprolol succinate (TOPROL-XL) 25 MG 24 hr tablet, Take 1 tablet (25 mg total) by mouth daily., Disp: 90 tablet, Rfl: 3 .  Wheat Dextrin (BENEFIBER DRINK MIX PO), Take 1 Scoop by mouth daily., Disp: , Rfl:  .  acetaminophen (TYLENOL) 500 MG tablet, Take 2 tablets (1,000 mg total)  by mouth every 8 (eight) hours. (Patient not taking: Reported on 07/31/2019), Disp: 30 tablet, Rfl: 0 .  Biotin (BIOTIN 5000) 5 MG CAPS, Take 5 mg by mouth daily.  (Patient not taking: Reported on 07/31/2019), Disp: , Rfl:  .  calcium-vitamin D (OSCAL WITH D) 500-200 MG-UNIT tablet, Take 1 tablet by mouth daily with breakfast. (Patient not taking: Reported on 07/31/2019), Disp: , Rfl:  .  fluticasone (FLONASE) 50 MCG/ACT nasal spray, Place 2 sprays into both nostrils daily as needed for allergies. (Patient not taking: Reported on 07/31/2019), Disp: , Rfl:  .  Multiple Vitamins-Minerals (MULTIVITAMINS THER. W/MINERALS) TABS, Take 1 tablet by mouth daily.   (Patient not taking: Reported on 07/31/2019), Disp: , Rfl:   Past Medical Problems: Past Medical History:  Diagnosis Date  . Acne vulgaris 01/19/15  . Allergic rhinitis   . Borderline diabetes 11/14/11  . Eczematous dermatitis 11/08/2010   eczema  . Endometriosis   . Esophageal reflux   . Fibromyalgia   . Gout 02/08/12   idopathic gout, left ankle and foot  . Gynecomastia 11/18/13   hypertrophy of breast  . Obesity, unspecified 07/04/05  . Osteoarthritis    Right hip and knee  . Paresthesias/numbness 11/14/11  . Pre-diabetes   . Premature menopause 07/09/13  . Sinus tachycardia   .  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    Past Surgical History: Past Surgical History:  Procedure Laterality Date  . Cardiac Event Monitor  03/2015   [facility]: Sinus rhythm with occasional sinus tachycardia. Rates as high as 122. No arrhythmias seen.  Marland Kitchen JOINT REPLACEMENT Right 05/23/2006  . LIPOSUCTION  2014  . TOTAL HIP ARTHROPLASTY Right 05/23/2006  . TOTAL HIP ARTHROPLASTY Left 04/02/2018   Procedure: TOTAL HIP ARTHROPLASTY ANTERIOR APPROACH;  Surgeon: Durene Romans, MD;  Location: [facility];  Service: Orthopedics;  Laterality: Left;  .  TRANSTHORACIC ECHOCARDIOGRAM  03/02/2015   [facility], Dr. Sherril Croon: Normal LV size, mild-moderate concentric LVH with mild diffuse HK. EF~50%. Mild LA dilation. Could not exclude small ASD/PFO due to atrial septal dropout. Mild TR, mild MR.  Marland Kitchen TREADMILL STRESS ECHOCARDIOGRAM  07/2015   Exercised for 7 minutes-8.5 METS.  Terminated due to fatigue and reaching target heart rate -171 bpm which is under percent of expected).  No chest pain.  EKG negative for ischemia.  EF normal at baseline (55%) improved to 65% with exertion..  Baseline echo did not show evidence of LVH or R WMA.    Social History: Social History   Socioeconomic History  . Marital status: Married    Spouse name: Not on file  . Number of children: Not on file  . Years of education: Not on file  . Highest education level: Not on file  Occupational History  . Not on file  Tobacco Use  . Smoking status: Never Smoker  . Smokeless tobacco: Never Used  Vaping Use  . Vaping Use: Never used  Substance and Sexual Activity  . Alcohol use: No    Alcohol/week: 0.0 standard drinks  . Drug use: No  . Sexual activity: Not on file  Other Topics Concern  . Not on file  Social History Narrative   Married now for 3 years. Mother of one.   This with her spouse. Is a Estate manager/land agent for [employer]   Does not drink does not smoke and does not use illicit drugs. Does not exercise.   Social Determinants of Health   Financial Resource Strain:   . Difficulty of Paying Living Expenses:   Food Insecurity:   . Worried About Programme researcher, broadcasting/film/video in the Last Year:   . Barista in the Last Year:   Transportation Needs:   . Freight forwarder (Medical):   Marland Kitchen Lack of Transportation (Non-Medical):   Physical Activity:   . Days of Exercise per Week:   . Minutes of Exercise per Session:   Stress:   . Feeling of Stress :   Social Connections:   . Frequency of Communication with Friends and Family:   . Frequency of  Social Gatherings with Friends and Family:   . Attends Religious Services:   . Active Member of Clubs or Organizations:   . Attends Banker Meetings:   Marland Kitchen Marital Status:   Intimate Partner Violence:   . Fear of Current or Ex-Partner:   . Emotionally Abused:   Marland Kitchen Physically Abused:   . Sexually Abused:     Family History: Family History  Problem Relation Age of Onset  . Healthy Father   . Cancer Mother        leukemia   . Heart failure Maternal Aunt   . Breast cancer Maternal Aunt  Review of Systems: Review of Systems  Constitutional: Negative for chills and fever.  HENT: Negative for congestion and sore throat.   Respiratory: Negative for cough and shortness of breath.   Cardiovascular: Negative for chest pain and palpitations.  Gastrointestinal: Negative for abdominal pain, nausea and vomiting.  Musculoskeletal: Positive for back pain, myalgias and neck pain. Negative for joint pain.  Skin: Negative for itching and rash.    Physical Exam: Vital Signs BP (!) 128/98 (BP Location: Left Arm, Patient Position: Sitting, Cuff Size: Large)   Pulse 87   Temp (!) 97.1 F (36.2 C) (Temporal)   LMP 07/14/2013   SpO2 97%  Physical Exam Constitutional:      General: She is not in acute distress.    Appearance: Normal appearance. She is obese.  HENT:     Head: Normocephalic and atraumatic.  Eyes:     Extraocular Movements: Extraocular movements intact.  Cardiovascular:     Rate and Rhythm: Normal rate and regular rhythm.     Pulses: Normal pulses.     Heart sounds: Normal heart sounds.  Pulmonary:     Effort: Pulmonary effort is normal.     Breath sounds: Normal breath sounds. No wheezing, rhonchi or rales.  Abdominal:     General: Bowel sounds are normal.     Palpations: Abdomen is soft.  Musculoskeletal:        General: No swelling. Normal range of motion.     Cervical back: Normal range of motion.  Skin:    General: Skin is warm and dry.      Coloration: Skin is not pale.     Findings: No erythema or rash.  Neurological:     General: No focal deficit present.     Mental Status: She is alert and oriented to person, place, and time.  Psychiatric:        Mood and Affect: Mood normal.        Behavior: Behavior normal.        Thought Content: Thought content normal.        Judgment: Judgment normal.     Assessment/Plan:  Ms. Inda Merlin scheduled for bilateral breast reduction with liposuction with Dr. Ulice Bold.  Risks, benefits, and alternatives of procedure discussed, questions answered and consent obtained.    Cardiology Visit 06/23/19: Preoperative cardiac evaluation [name], NP) Chart reviewed as part of pre-operative protocol coverage. Given past medical history and time since last visit, based on ACC/AHA guidelines, she would be at acceptable risk for the planned procedure without further cardiovascular testing.   Smoking Status: non-smoker; Counseling Given? N/A Last Mammogram: 10/02/2018; Results: No findings suspicious for malignancy No personal Hx of breast cancer; Has a family Hx of breast cancer  Caprini Score: 5 High; Risk Factors include: 54 year old female, varicose veins, BMI > 25, and length of planned surgery. Recommendation for mechanical and pharmacological prophylaxis during surgery. Encourage early ambulation.   Pictures obtained: 06/06/2019  Post-op Rx sent to pharmacy: Norco, Zofran, Keflex Pain management plan: 1) Ibuprofen 600 mg every 6 hours 2) Tylenol 500 mg every 6-8 hours 3) RX pain medication up to every 8 hours. She should avoid steroid use until after healed from surgery.   Patient was provided with the Breast Reduction Risks and General Surgical Risks consent documents and Pain Medication Agreement prior to their appointment.  They had adequate time to read through the risk consent documents and Pain Medication Agreement. We also discussed them in person together during this preop  appointment. All of their questions were answered to their satisfaction.  Recommended calling if they have any further questions.  Risk consent form and Pain Medication Agreement to be scanned into patient's chart.  The risk that can be encountered with breast reduction were discussed and include the following but not limited to these:  Breast asymmetry, fluid accumulation, firmness of the breast, inability to breast feed, loss of nipple or areola, skin loss, decrease or no nipple sensation, fat necrosis of the breast tissue, bleeding, infection, healing delay.  There are risks of anesthesia, changes to skin sensation and injury to nerves or blood vessels.  The muscle can be temporarily or permanently injured.  You may have an allergic reaction to tape, suture, glue, blood products which can result in skin discoloration, swelling, pain, skin lesions, poor healing.  Any of these can lead to the need for revisonal surgery or stage procedures.  A reduction has potential to interfere with diagnostic procedures.  Nipple or breast piercing can increase risks of infection.  This procedure is best done when the breast is fully developed.  Changes in the breast will continue to occur over time.  Pregnancy can alter the outcomes of previous breast reduction surgery, weight gain and weigh loss can also effect the long term appearance.   The risks that can be encountered with and after liposuction were discussed and include the following but no limited to these:  Asymmetry, fluid accumulation, firmness of the area, fat necrosis with death of fat tissue, bleeding, infection, delayed healing, anesthesia risks, skin sensation changes, injury to structures including nerves, blood vessels, and muscles which may be temporary or permanent, allergies to tape, suture materials and glues, blood products, topical preparations or injected agents, skin and contour irregularities, skin discoloration and swelling, deep vein thrombosis,  cardiac and pulmonary complications, pain, which may persist, persistent pain, recurrence of the lesion, poor healing of the incision, possible need for revisional surgery or staged procedures. Thiere can also be persistent swelling, poor wound healing, rippling or loose skin, worsening of cellulite, swelling, and thermal burn or heat injury from ultrasound with the ultrasound-assisted lipoplasty technique. Any change in weight fluctuations can alter the outcome.  The 21st Century Cures Act was signed into law in 2016 which includes the topic of electronic health records.  This provides immediate access to information in MyChart.  This includes consultation notes, operative notes, office notes, lab results and pathology reports.  If you have any questions about what you read please let us know at your next visit or call us at the office.  We are right here with you.   Electronically signed by: Eldridge Abrahams, PA-C 07/31/2019 11:44 AM

## 2019-07-31 NOTE — Progress Notes (Addendum)
ICD-10-CM   1. Symptomatic mammary hypertrophy  N62   2. Chronic bilateral thoracic back pain  M54.6    G89.29   3. Neck pain  M54.2       Patient ID: Laurie [name] [facility], female    DOB: 09-Jul-1965, 54 y.o.   MRN: [id number]   History of Present Illness: Laurie [name] is a 54 y.o.  female  with a history of symptomatic mammary hyperplasia.  She presents for preoperative evaluation for upcoming procedure, bilateral breast reduction with liposuction, scheduled for 08/20/2019 with Dr. Ulice Bold.   Summary from previous visit: Patient has large but fairly symmetric breasts.  Sternal to nipple distance on the right is 37 cm and the left is 37 cm.  The IMF distance is 17 cm.  She is 5 feet 7 inches tall and weighs 224 pounds.  Preoperative bra size is a 40H.  She would like to be around a C or D cup.  The estimated excess tissue to be removed at time of surgery equals 700 g from each side.  Job: N/A - disability  PMH Significant for: Bilateral hip replacements, OA, gout, prediabetes, fibromyalgia  The patient has not had problems with anesthesia.   Past Medical History: Allergies: Allergies  Allergen Reactions  . Sulfa Antibiotics Swelling  . Sulfasalazine Swelling    Current Medications:  Current Outpatient Medications:  .  allopurinol (ZYLOPRIM) 100 MG tablet, Take 100 mg by mouth daily., Disp: , Rfl:  .  folic acid (FOLVITE) 1 MG tablet, Take 1 mg by mouth daily. , Disp: , Rfl:  .  gabapentin (NEURONTIN) 300 MG capsule, Take 300 mg by mouth 2 (two) times daily., Disp: , Rfl:  .  lubiprostone (AMITIZA) 24 MCG capsule, Take 24 mcg by mouth daily as needed for constipation. , Disp: , Rfl:  .  metoprolol succinate (TOPROL-XL) 25 MG 24 hr tablet, Take 1 tablet (25 mg total) by mouth daily., Disp: 90 tablet, Rfl: 3 .  Wheat Dextrin (BENEFIBER DRINK MIX PO), Take 1 Scoop by mouth daily., Disp: , Rfl:  .  acetaminophen (TYLENOL) 500 MG tablet, Take 2 tablets (1,000 mg total)  by mouth every 8 (eight) hours. (Patient not taking: Reported on 07/31/2019), Disp: 30 tablet, Rfl: 0 .  Biotin (BIOTIN 5000) 5 MG CAPS, Take 5 mg by mouth daily.  (Patient not taking: Reported on 07/31/2019), Disp: , Rfl:  .  calcium-vitamin D (OSCAL WITH D) 500-200 MG-UNIT tablet, Take 1 tablet by mouth daily with breakfast. (Patient not taking: Reported on 07/31/2019), Disp: , Rfl:  .  fluticasone (FLONASE) 50 MCG/ACT nasal spray, Place 2 sprays into both nostrils daily as needed for allergies. (Patient not taking: Reported on 07/31/2019), Disp: , Rfl:  .  Multiple Vitamins-Minerals (MULTIVITAMINS THER. W/MINERALS) TABS, Take 1 tablet by mouth daily.   (Patient not taking: Reported on 07/31/2019), Disp: , Rfl:   Past Medical Problems: Past Medical History:  Diagnosis Date  . Acne vulgaris 01/19/15  . Allergic rhinitis   . Borderline diabetes 11/14/11  . Eczematous dermatitis 11/08/2010   eczema  . Endometriosis   . Esophageal reflux   . Fibromyalgia   . Gout 02/08/12   idopathic gout, left ankle and foot  . Gynecomastia 11/18/13   hypertrophy of breast  . Obesity, unspecified 07/04/05  . Osteoarthritis    Right hip and knee  . Paresthesias/numbness 11/14/11  . Pre-diabetes   . Premature menopause 07/09/13  . Sinus tachycardia   .  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    Past Surgical History: Past Surgical History:  Procedure Laterality Date  . Cardiac Event Monitor  03/2015   [facility]: Sinus rhythm with occasional sinus tachycardia. Rates as high as 122. No arrhythmias seen.  Marland Kitchen JOINT REPLACEMENT Right 05/23/2006  . LIPOSUCTION  2014  . TOTAL HIP ARTHROPLASTY Right 05/23/2006  . TOTAL HIP ARTHROPLASTY Left 04/02/2018   Procedure: TOTAL HIP ARTHROPLASTY ANTERIOR APPROACH;  Surgeon: Durene Romans, MD;  Location: [facility];  Service: Orthopedics;  Laterality: Left;  .  TRANSTHORACIC ECHOCARDIOGRAM  03/02/2015   [facility], Dr. Sherril Croon: Normal LV size, mild-moderate concentric LVH with mild diffuse HK. EF~50%. Mild LA dilation. Could not exclude small ASD/PFO due to atrial septal dropout. Mild TR, mild MR.  Marland Kitchen TREADMILL STRESS ECHOCARDIOGRAM  07/2015   Exercised for 7 minutes-8.5 METS.  Terminated due to fatigue and reaching target heart rate -171 bpm which is under percent of expected).  No chest pain.  EKG negative for ischemia.  EF normal at baseline (55%) improved to 65% with exertion..  Baseline echo did not show evidence of LVH or R WMA.    Social History: Social History   Socioeconomic History  . Marital status: Married    Spouse name: Not on file  . Number of children: Not on file  . Years of education: Not on file  . Highest education level: Not on file  Occupational History  . Not on file  Tobacco Use  . Smoking status: Never Smoker  . Smokeless tobacco: Never Used  Vaping Use  . Vaping Use: Never used  Substance and Sexual Activity  . Alcohol use: No    Alcohol/week: 0.0 standard drinks  . Drug use: No  . Sexual activity: Not on file  Other Topics Concern  . Not on file  Social History Narrative   Married now for 3 years. Mother of one.   This with her spouse. Is a Estate manager/land agent for [employer]   Does not drink does not smoke and does not use illicit drugs. Does not exercise.   Social Determinants of Health   Financial Resource Strain:   . Difficulty of Paying Living Expenses:   Food Insecurity:   . Worried About Programme researcher, broadcasting/film/video in the Last Year:   . Barista in the Last Year:   Transportation Needs:   . Freight forwarder (Medical):   Marland Kitchen Lack of Transportation (Non-Medical):   Physical Activity:   . Days of Exercise per Week:   . Minutes of Exercise per Session:   Stress:   . Feeling of Stress :   Social Connections:   . Frequency of Communication with Friends and Family:   . Frequency of  Social Gatherings with Friends and Family:   . Attends Religious Services:   . Active Member of Clubs or Organizations:   . Attends Banker Meetings:   Marland Kitchen Marital Status:   Intimate Partner Violence:   . Fear of Current or Ex-Partner:   . Emotionally Abused:   Marland Kitchen Physically Abused:   . Sexually Abused:     Family History: Family History  Problem Relation Age of Onset  . Healthy Father   . Cancer Mother        leukemia   . Heart failure Maternal Aunt   . Breast cancer Maternal Aunt  Review of Systems: Review of Systems  Constitutional: Negative for chills and fever.  HENT: Negative for congestion and sore throat.   Respiratory: Negative for cough and shortness of breath.   Cardiovascular: Negative for chest pain and palpitations.  Gastrointestinal: Negative for abdominal pain, nausea and vomiting.  Musculoskeletal: Positive for back pain, myalgias and neck pain. Negative for joint pain.  Skin: Negative for itching and rash.    Physical Exam: Vital Signs BP (!) 128/98 (BP Location: Left Arm, Patient Position: Sitting, Cuff Size: Large)   Pulse 87   Temp (!) 97.1 F (36.2 C) (Temporal)   LMP 07/14/2013   SpO2 97%  Physical Exam Constitutional:      General: She is not in acute distress.    Appearance: Normal appearance. She is obese.  HENT:     Head: Normocephalic and atraumatic.  Eyes:     Extraocular Movements: Extraocular movements intact.  Cardiovascular:     Rate and Rhythm: Normal rate and regular rhythm.     Pulses: Normal pulses.     Heart sounds: Normal heart sounds.  Pulmonary:     Effort: Pulmonary effort is normal.     Breath sounds: Normal breath sounds. No wheezing, rhonchi or rales.  Abdominal:     General: Bowel sounds are normal.     Palpations: Abdomen is soft.  Musculoskeletal:        General: No swelling. Normal range of motion.     Cervical back: Normal range of motion.  Skin:    General: Skin is warm and dry.      Coloration: Skin is not pale.     Findings: No erythema or rash.  Neurological:     General: No focal deficit present.     Mental Status: She is alert and oriented to person, place, and time.  Psychiatric:        Mood and Affect: Mood normal.        Behavior: Behavior normal.        Thought Content: Thought content normal.        Judgment: Judgment normal.     Assessment/Plan:  Ms. Hickman scheduled for bilateral breast reduction with liposuction with Dr. Dillingham.  Risks, benefits, and alternatives of procedure discussed, questions answered and consent obtained.    Cardiology Visit 06/23/19: Preoperative cardiac evaluation (Kathryn Lawrence, NP) Chart reviewed as part of pre-operative protocol coverage. Given past medical history and time since last visit, based on ACC/AHA guidelines, she would be at acceptable risk for the planned procedure without further cardiovascular testing.   Smoking Status: non-smoker; Counseling Given? N/A Last Mammogram: 10/02/2018; Results: No findings suspicious for malignancy No personal Hx of breast cancer; Has a family Hx of breast cancer  Caprini Score: 5 High; Risk Factors include: 54-year-old female, varicose veins, BMI > 25, and length of planned surgery. Recommendation for mechanical and pharmacological prophylaxis during surgery. Encourage early ambulation.   Pictures obtained: 06/06/2019  Post-op Rx sent to pharmacy: Norco, Zofran, Keflex Pain management plan: 1) Ibuprofen 600 mg every 6 hours 2) Tylenol 500 mg every 6-8 hours 3) RX pain medication up to every 8 hours. She should avoid steroid use until after healed from surgery.   Patient was provided with the Breast Reduction Risks and General Surgical Risks consent documents and Pain Medication Agreement prior to their appointment.  They had adequate time to read through the risk consent documents and Pain Medication Agreement. We also discussed them in person together during this preop    appointment. All of their questions were answered to their satisfaction.  Recommended calling if they have any further questions.  Risk consent form and Pain Medication Agreement to be scanned into patient's chart.  The risk that can be encountered with breast reduction were discussed and include the following but not limited to these:  Breast asymmetry, fluid accumulation, firmness of the breast, inability to breast feed, loss of nipple or areola, skin loss, decrease or no nipple sensation, fat necrosis of the breast tissue, bleeding, infection, healing delay.  There are risks of anesthesia, changes to skin sensation and injury to nerves or blood vessels.  The muscle can be temporarily or permanently injured.  You may have an allergic reaction to tape, suture, glue, blood products which can result in skin discoloration, swelling, pain, skin lesions, poor healing.  Any of these can lead to the need for revisonal surgery or stage procedures.  A reduction has potential to interfere with diagnostic procedures.  Nipple or breast piercing can increase risks of infection.  This procedure is best done when the breast is fully developed.  Changes in the breast will continue to occur over time.  Pregnancy can alter the outcomes of previous breast reduction surgery, weight gain and weigh loss can also effect the long term appearance.   The risks that can be encountered with and after liposuction were discussed and include the following but no limited to these:  Asymmetry, fluid accumulation, firmness of the area, fat necrosis with death of fat tissue, bleeding, infection, delayed healing, anesthesia risks, skin sensation changes, injury to structures including nerves, blood vessels, and muscles which may be temporary or permanent, allergies to tape, suture materials and glues, blood products, topical preparations or injected agents, skin and contour irregularities, skin discoloration and swelling, deep vein thrombosis,  cardiac and pulmonary complications, pain, which may persist, persistent pain, recurrence of the lesion, poor healing of the incision, possible need for revisional surgery or staged procedures. Thiere can also be persistent swelling, poor wound healing, rippling or loose skin, worsening of cellulite, swelling, and thermal burn or heat injury from ultrasound with the ultrasound-assisted lipoplasty technique. Any change in weight fluctuations can alter the outcome.  The 21st Century Cures Act was signed into law in 2016 which includes the topic of electronic health records.  This provides immediate access to information in MyChart.  This includes consultation notes, operative notes, office notes, lab results and pathology reports.  If you have any questions about what you read please let us know at your next visit or call us at the office.  We are right here with you.   Electronically signed by: Eldridge Abrahams, PA-C 07/31/2019 11:44 AM

## 2019-08-05 ENCOUNTER — Telehealth: Payer: Self-pay

## 2019-08-05 NOTE — Telephone Encounter (Signed)
Patient called with questions about her upcoming surgery and has requested a call back. She would like to discuss medications/supplements relating to her anemia.

## 2019-08-05 NOTE — Telephone Encounter (Signed)
Spoke with patient. Ok for her to continue taking her regular medications at this time and drink beet juice.  The pre-op department will call her ~ a week before surgery to go over medications and supplements and give her specific guidance of any she should hold/take the day of surgery.

## 2019-08-08 DIAGNOSIS — M609 Myositis, unspecified: Secondary | ICD-10-CM | POA: Diagnosis not present

## 2019-08-08 DIAGNOSIS — K219 Gastro-esophageal reflux disease without esophagitis: Secondary | ICD-10-CM | POA: Diagnosis not present

## 2019-08-08 DIAGNOSIS — I83813 Varicose veins of bilateral lower extremities with pain: Secondary | ICD-10-CM | POA: Diagnosis not present

## 2019-08-08 DIAGNOSIS — R7303 Prediabetes: Secondary | ICD-10-CM | POA: Diagnosis not present

## 2019-08-11 ENCOUNTER — Telehealth (INDEPENDENT_AMBULATORY_CARE_PROVIDER_SITE_OTHER): Payer: BC Managed Care – PPO

## 2019-08-11 DIAGNOSIS — N62 Hypertrophy of breast: Secondary | ICD-10-CM

## 2019-08-11 MED ORDER — FLUCONAZOLE 150 MG PO TABS: 150.0000 mg | ORAL_TABLET | Freq: Once | ORAL | 0 refills | Status: AC

## 2019-08-11 NOTE — Telephone Encounter (Signed)
Called patient and advised the medication was sent to [facility]. She stated that was in correct and should be sent to [facility] on [address] in [place].  This has been corrected in her chart.

## 2019-08-11 NOTE — Telephone Encounter (Cosign Needed)
Fluconazole sent to pharmacy per patient request.

## 2019-08-11 NOTE — Telephone Encounter (Signed)
Patient called as she realized she has been prescribed antibiotics postop. She has requested a prescription for fluconazole.

## 2019-08-13 DIAGNOSIS — M9903 Segmental and somatic dysfunction of lumbar region: Secondary | ICD-10-CM | POA: Diagnosis not present

## 2019-08-13 DIAGNOSIS — M9905 Segmental and somatic dysfunction of pelvic region: Secondary | ICD-10-CM | POA: Diagnosis not present

## 2019-08-13 DIAGNOSIS — M5431 Sciatica, right side: Secondary | ICD-10-CM | POA: Diagnosis not present

## 2019-08-13 DIAGNOSIS — M5386 Other specified dorsopathies, lumbar region: Secondary | ICD-10-CM | POA: Diagnosis not present

## 2019-08-14 ENCOUNTER — Telehealth: Payer: Self-pay

## 2019-08-14 ENCOUNTER — Encounter (HOSPITAL_BASED_OUTPATIENT_CLINIC_OR_DEPARTMENT_OTHER): Payer: Self-pay | Admitting: Plastic Surgery

## 2019-08-14 ENCOUNTER — Other Ambulatory Visit: Payer: Self-pay

## 2019-08-14 NOTE — Telephone Encounter (Signed)
Patient called to follow up on request for her fluconazole prescription which should be sent to her pharmacy: [id number] [place], [place] - [address] It was sent to a different pharmacy initially and she would like it sent to her preferred pharmacy today.

## 2019-08-14 NOTE — Telephone Encounter (Signed)
[facility] did not receive medication that was sent in. Called [facility], cancelled the Fluconazole. Called  [facility] on [address], [place] and spoke with Melina Schools to dispense 1 tablet of Fluconazole 150mg  with no refills. Called patient back and advised prescription has been sent to the correct pharmacy.

## 2019-08-16 ENCOUNTER — Other Ambulatory Visit (HOSPITAL_COMMUNITY)
Admission: RE | Admit: 2019-08-16 | Discharge: 2019-08-16 | Disposition: A | Discharge status: Home / Self Care | Payer: BC Managed Care – PPO | Source: Ambulatory Visit | Attending: Plastic Surgery | Admitting: Plastic Surgery

## 2019-08-16 DIAGNOSIS — Z01812 Encounter for preprocedural laboratory examination: Secondary | ICD-10-CM | POA: Diagnosis not present

## 2019-08-16 DIAGNOSIS — Z20822 Contact with and (suspected) exposure to covid-19: Secondary | ICD-10-CM | POA: Insufficient documentation

## 2019-08-16 LAB — SARS CORONAVIRUS 2 (TAT 6-24 HRS): SARS Coronavirus 2: NEGATIVE

## 2019-08-18 ENCOUNTER — Other Ambulatory Visit (HOSPITAL_COMMUNITY): Payer: BC Managed Care – PPO

## 2019-08-19 NOTE — Anesthesia Preprocedure Evaluation (Addendum)
Anesthesia Evaluation  Patient identified by MRN, date of birth, ID band Patient awake    Reviewed: Allergy & Precautions, NPO status , Patient's Chart, lab work & pertinent test results  Airway Mallampati: II  TM Distance: >3 FB Neck ROM: Full    Dental  (+) Edentulous Upper, Partial Lower, Dental Advisory Given   Pulmonary shortness of breath,    Pulmonary exam normal breath sounds clear to auscultation       Cardiovascular Exercise Tolerance: Good hypertension, Pt. on home beta blockers Normal cardiovascular exam Rhythm:Regular Rate:Normal  Stress echo 08/18/15  Stress echo results:     Left ventricular ejection fraction was normal at rest and with stress. There was no echocardiographic evidence for stress-induced ischemia.   Neuro/Psych Anxiety  Neuromuscular disease negative psych ROS   GI/Hepatic Neg liver ROS, GERD  Medicated,  Endo/Other    Renal/GU Cr.083     Musculoskeletal  (+) Arthritis , Fibromyalgia -  Abdominal (+) + obese,   Peds  Hematology  (+) anemia , Hgb 12.0 Plts 253   Anesthesia Other Findings   Reproductive/Obstetrics                            Anesthesia Physical  Anesthesia Plan  ASA: III  Anesthesia Plan: General   Post-op Pain Management:    Induction:   PONV Risk Score and Plan: 4 or greater and Treatment may vary due to age or medical condition, Ondansetron, Dexamethasone, Midazolam and Scopolamine patch - Pre-op  Airway Management Planned: Oral ETT  Additional Equipment: None  Intra-op Plan:   Post-operative Plan: Extubation in OR  Informed Consent: I have reviewed the patients History and Physical, chart, labs and discussed the procedure including the risks, benefits and alternatives for the proposed anesthesia with the patient or authorized representative who has indicated his/her understanding and acceptance.     Dental advisory  given  Plan Discussed with: CRNA  Anesthesia Plan Comments:        Anesthesia Quick Evaluation

## 2019-08-20 ENCOUNTER — Ambulatory Visit (HOSPITAL_BASED_OUTPATIENT_CLINIC_OR_DEPARTMENT_OTHER): Payer: BC Managed Care – PPO | Admitting: Anesthesiology

## 2019-08-20 ENCOUNTER — Encounter (HOSPITAL_BASED_OUTPATIENT_CLINIC_OR_DEPARTMENT_OTHER)
Admission: RE | Disposition: A | Discharge status: Home / Self Care | Payer: Self-pay | Source: Home / Self Care | Attending: Plastic Surgery

## 2019-08-20 ENCOUNTER — Other Ambulatory Visit: Payer: Self-pay

## 2019-08-20 ENCOUNTER — Encounter (HOSPITAL_BASED_OUTPATIENT_CLINIC_OR_DEPARTMENT_OTHER): Payer: Self-pay | Admitting: Plastic Surgery

## 2019-08-20 ENCOUNTER — Observation Stay (HOSPITAL_BASED_OUTPATIENT_CLINIC_OR_DEPARTMENT_OTHER)
Admission: RE | Admit: 2019-08-20 | Discharge: 2019-08-21 | Disposition: A | Discharge status: Home / Self Care | Payer: BC Managed Care – PPO | Attending: Plastic Surgery | Admitting: Plastic Surgery

## 2019-08-20 DIAGNOSIS — G8929 Other chronic pain: Secondary | ICD-10-CM | POA: Insufficient documentation

## 2019-08-20 DIAGNOSIS — N62 Hypertrophy of breast: Secondary | ICD-10-CM | POA: Diagnosis not present

## 2019-08-20 DIAGNOSIS — N6081 Other benign mammary dysplasias of right breast: Secondary | ICD-10-CM | POA: Diagnosis not present

## 2019-08-20 DIAGNOSIS — Z96641 Presence of right artificial hip joint: Secondary | ICD-10-CM | POA: Diagnosis not present

## 2019-08-20 DIAGNOSIS — M542 Cervicalgia: Secondary | ICD-10-CM | POA: Insufficient documentation

## 2019-08-20 DIAGNOSIS — M546 Pain in thoracic spine: Secondary | ICD-10-CM | POA: Diagnosis not present

## 2019-08-20 DIAGNOSIS — I10 Essential (primary) hypertension: Secondary | ICD-10-CM | POA: Diagnosis not present

## 2019-08-20 DIAGNOSIS — M545 Low back pain: Secondary | ICD-10-CM | POA: Diagnosis not present

## 2019-08-20 DIAGNOSIS — N6082 Other benign mammary dysplasias of left breast: Secondary | ICD-10-CM | POA: Diagnosis not present

## 2019-08-20 DIAGNOSIS — N6011 Diffuse cystic mastopathy of right breast: Secondary | ICD-10-CM | POA: Diagnosis not present

## 2019-08-20 DIAGNOSIS — Z96643 Presence of artificial hip joint, bilateral: Secondary | ICD-10-CM | POA: Insufficient documentation

## 2019-08-20 DIAGNOSIS — Z79899 Other long term (current) drug therapy: Secondary | ICD-10-CM | POA: Insufficient documentation

## 2019-08-20 DIAGNOSIS — N6012 Diffuse cystic mastopathy of left breast: Secondary | ICD-10-CM | POA: Diagnosis not present

## 2019-08-20 HISTORY — PX: BREAST REDUCTION SURGERY: SHX8

## 2019-08-20 HISTORY — DX: Constipation, unspecified: K59.00

## 2019-08-20 HISTORY — DX: Anemia, unspecified: D64.9

## 2019-08-20 HISTORY — DX: Essential (primary) hypertension: I10

## 2019-08-20 SURGERY — MAMMOPLASTY, REDUCTION
Anesthesia: General | Site: Breast

## 2019-08-20 MED ORDER — CHLORHEXIDINE GLUCONATE CLOTH 2 % EX PADS: 6.0000 | MEDICATED_PAD | Freq: Once | CUTANEOUS | Status: DC

## 2019-08-20 MED ORDER — LACTATED RINGERS IV SOLN: INTRAVENOUS | Status: DC

## 2019-08-20 MED ORDER — CEFAZOLIN SODIUM-DEXTROSE 2-4 GM/100ML-% IV SOLN
2.0000 g | Freq: Three times a day (TID) | INTRAVENOUS | Status: DC
  Administered 2019-08-20 (×2): 2 g via INTRAVENOUS
  Filled 2019-08-20 (×2): qty 100

## 2019-08-20 MED ORDER — BUPIVACAINE HCL (PF) 0.25 % IJ SOLN
INTRAMUSCULAR | Status: AC
  Filled 2019-08-20: qty 30

## 2019-08-20 MED ORDER — LACTATED RINGERS IV SOLN
INTRAVENOUS | Status: AC | PRN
  Administered 2019-08-20: 1000 mL

## 2019-08-20 MED ORDER — LIDOCAINE HCL 1 % IJ SOLN
INTRAMUSCULAR | Status: DC | PRN
  Administered 2019-08-20: 50 mL

## 2019-08-20 MED ORDER — SODIUM CHLORIDE 0.9 % IV SOLN
INTRAVENOUS | Status: DC | PRN
  Administered 2019-08-20: 500 mL

## 2019-08-20 MED ORDER — DEXAMETHASONE SODIUM PHOSPHATE 10 MG/ML IJ SOLN
INTRAMUSCULAR | Status: AC
  Filled 2019-08-20: qty 3

## 2019-08-20 MED ORDER — PROPOFOL 500 MG/50ML IV EMUL
INTRAVENOUS | Status: AC
  Filled 2019-08-20: qty 50

## 2019-08-20 MED ORDER — DIPHENHYDRAMINE HCL 12.5 MG/5ML PO ELIX: 12.5000 mg | ORAL_SOLUTION | Freq: Four times a day (QID) | ORAL | Status: DC | PRN

## 2019-08-20 MED ORDER — PROPOFOL 500 MG/50ML IV EMUL
INTRAVENOUS | Status: DC | PRN
Start: 2019-08-20 — End: 2019-08-20
  Administered 2019-08-20: 25 ug/kg/min via INTRAVENOUS

## 2019-08-20 MED ORDER — SODIUM CHLORIDE 0.9 % IV SOLN
INTRAVENOUS | Status: AC
  Filled 2019-08-20: qty 500000

## 2019-08-20 MED ORDER — MEPERIDINE HCL 25 MG/ML IJ SOLN
INTRAMUSCULAR | Status: AC
  Filled 2019-08-20: qty 1

## 2019-08-20 MED ORDER — LORAZEPAM 1 MG PO TABS
1.0000 mg | ORAL_TABLET | Freq: Four times a day (QID) | ORAL | Status: DC | PRN
  Administered 2019-08-20 (×2): 1 mg via ORAL
  Filled 2019-08-20 (×2): qty 1

## 2019-08-20 MED ORDER — TRAMADOL HCL 50 MG PO TABS: 50.0000 mg | ORAL_TABLET | Freq: Four times a day (QID) | ORAL | Status: DC | PRN

## 2019-08-20 MED ORDER — LIDOCAINE HCL (PF) 1 % IJ SOLN
INTRAMUSCULAR | Status: AC
  Filled 2019-08-20: qty 60

## 2019-08-20 MED ORDER — OXYCODONE HCL 5 MG PO TABS: 5.0000 mg | ORAL_TABLET | ORAL | Status: DC | PRN

## 2019-08-20 MED ORDER — LIDOCAINE 2% (20 MG/ML) 5 ML SYRINGE
INTRAMUSCULAR | Status: DC | PRN
  Administered 2019-08-20: 100 mg via INTRAVENOUS

## 2019-08-20 MED ORDER — ACETAMINOPHEN 325 MG RE SUPP: 650.0000 mg | RECTAL | Status: DC | PRN

## 2019-08-20 MED ORDER — ONDANSETRON 4 MG PO TBDP: 4.0000 mg | ORAL_TABLET | Freq: Four times a day (QID) | ORAL | Status: DC | PRN

## 2019-08-20 MED ORDER — EPHEDRINE SULFATE 50 MG/ML IJ SOLN
INTRAMUSCULAR | Status: DC | PRN
  Administered 2019-08-20: 10 mg via INTRAVENOUS

## 2019-08-20 MED ORDER — FENTANYL CITRATE (PF) 100 MCG/2ML IJ SOLN
INTRAMUSCULAR | Status: AC
  Filled 2019-08-20: qty 2

## 2019-08-20 MED ORDER — SODIUM CHLORIDE 0.9% FLUSH: 3.0000 mL | Freq: Two times a day (BID) | INTRAVENOUS | Status: DC

## 2019-08-20 MED ORDER — ACETAMINOPHEN 325 MG PO TABS: 650.0000 mg | ORAL_TABLET | ORAL | Status: DC | PRN

## 2019-08-20 MED ORDER — SENNA 8.6 MG PO TABS
1.0000 | ORAL_TABLET | Freq: Two times a day (BID) | ORAL | Status: DC
  Administered 2019-08-20: 8.6 mg via ORAL
  Filled 2019-08-20: qty 1

## 2019-08-20 MED ORDER — PROMETHAZINE HCL 25 MG/ML IJ SOLN: 6.2500 mg | INTRAMUSCULAR | Status: DC | PRN

## 2019-08-20 MED ORDER — SODIUM CHLORIDE 0.9 % IV SOLN: 250.0000 mL | INTRAVENOUS | Status: DC | PRN

## 2019-08-20 MED ORDER — OXYCODONE HCL 5 MG PO TABS: 5.0000 mg | ORAL_TABLET | Freq: Once | ORAL | Status: DC | PRN

## 2019-08-20 MED ORDER — CEFAZOLIN SODIUM-DEXTROSE 2-4 GM/100ML-% IV SOLN
2.0000 g | INTRAVENOUS | Status: AC
  Administered 2019-08-20: 2 g via INTRAVENOUS

## 2019-08-20 MED ORDER — MEPERIDINE HCL 25 MG/ML IJ SOLN
6.2500 mg | INTRAMUSCULAR | Status: DC | PRN
  Administered 2019-08-20: 12.5 mg via INTRAVENOUS

## 2019-08-20 MED ORDER — KCL IN DEXTROSE-NACL 20-5-0.45 MEQ/L-%-% IV SOLN
INTRAVENOUS | Status: DC
  Filled 2019-08-20: qty 1000

## 2019-08-20 MED ORDER — SODIUM CHLORIDE 0.9% FLUSH: 3.0000 mL | INTRAVENOUS | Status: DC | PRN

## 2019-08-20 MED ORDER — CEFAZOLIN SODIUM-DEXTROSE 2-4 GM/100ML-% IV SOLN
INTRAVENOUS | Status: AC
  Filled 2019-08-20: qty 100

## 2019-08-20 MED ORDER — HYDROMORPHONE HCL 1 MG/ML IJ SOLN
INTRAMUSCULAR | Status: AC
  Filled 2019-08-20: qty 0.5

## 2019-08-20 MED ORDER — HYDROCODONE-ACETAMINOPHEN 5-325 MG PO TABS
1.0000 | ORAL_TABLET | Freq: Four times a day (QID) | ORAL | Status: DC | PRN
  Administered 2019-08-20: 1 via ORAL
  Filled 2019-08-20: qty 1

## 2019-08-20 MED ORDER — ACETAMINOPHEN 325 MG PO TABS
325.0000 mg | ORAL_TABLET | Freq: Four times a day (QID) | ORAL | Status: DC
  Administered 2019-08-20: 325 mg via ORAL
  Filled 2019-08-20 (×2): qty 1

## 2019-08-20 MED ORDER — DIPHENHYDRAMINE HCL 50 MG/ML IJ SOLN: 12.5000 mg | Freq: Four times a day (QID) | INTRAMUSCULAR | Status: DC | PRN

## 2019-08-20 MED ORDER — ONDANSETRON HCL 4 MG/2ML IJ SOLN: 4.0000 mg | Freq: Four times a day (QID) | INTRAMUSCULAR | Status: DC | PRN

## 2019-08-20 MED ORDER — HYDROMORPHONE HCL 1 MG/ML IJ SOLN: 1.0000 mg | INTRAMUSCULAR | Status: DC | PRN

## 2019-08-20 MED ORDER — EPHEDRINE 5 MG/ML INJ
INTRAVENOUS | Status: AC
  Filled 2019-08-20: qty 10

## 2019-08-20 MED ORDER — EPINEPHRINE PF 1 MG/ML IJ SOLN
INTRAMUSCULAR | Status: AC
  Filled 2019-08-20: qty 1

## 2019-08-20 MED ORDER — SUCCINYLCHOLINE CHLORIDE 20 MG/ML IJ SOLN
INTRAMUSCULAR | Status: DC | PRN
  Administered 2019-08-20: 100 mg via INTRAVENOUS

## 2019-08-20 MED ORDER — ONDANSETRON HCL 4 MG/2ML IJ SOLN
INTRAMUSCULAR | Status: DC | PRN
  Administered 2019-08-20: 4 mg via INTRAVENOUS

## 2019-08-20 MED ORDER — MIDAZOLAM HCL 5 MG/5ML IJ SOLN
INTRAMUSCULAR | Status: DC | PRN
  Administered 2019-08-20: 2 mg via INTRAVENOUS

## 2019-08-20 MED ORDER — PHENYLEPHRINE 40 MCG/ML (10ML) SYRINGE FOR IV PUSH (FOR BLOOD PRESSURE SUPPORT)
PREFILLED_SYRINGE | INTRAVENOUS | Status: AC
  Filled 2019-08-20: qty 10

## 2019-08-20 MED ORDER — MORPHINE SULFATE (PF) 4 MG/ML IV SOLN: 2.0000 mg | INTRAVENOUS | Status: DC | PRN

## 2019-08-20 MED ORDER — MIDAZOLAM HCL 2 MG/2ML IJ SOLN
INTRAMUSCULAR | Status: AC
  Filled 2019-08-20: qty 2

## 2019-08-20 MED ORDER — FENTANYL CITRATE (PF) 100 MCG/2ML IJ SOLN
INTRAMUSCULAR | Status: DC | PRN
  Administered 2019-08-20: 25 ug via INTRAVENOUS
  Administered 2019-08-20: 100 ug via INTRAVENOUS
  Administered 2019-08-20: 50 ug via INTRAVENOUS
  Administered 2019-08-20: 25 ug via INTRAVENOUS

## 2019-08-20 MED ORDER — PHENOL 1.4 % MT LIQD
1.0000 | OROMUCOSAL | Status: DC | PRN
  Administered 2019-08-20 (×2): 1 via OROMUCOSAL
  Filled 2019-08-20 (×2): qty 177

## 2019-08-20 MED ORDER — OXYCODONE HCL 5 MG/5ML PO SOLN: 5.0000 mg | Freq: Once | ORAL | Status: DC | PRN

## 2019-08-20 MED ORDER — PROPOFOL 10 MG/ML IV BOLUS
INTRAVENOUS | Status: DC | PRN
  Administered 2019-08-20: 140 mg via INTRAVENOUS

## 2019-08-20 MED ORDER — DEXAMETHASONE SODIUM PHOSPHATE 4 MG/ML IJ SOLN
INTRAMUSCULAR | Status: DC | PRN
  Administered 2019-08-20: 10 mg via INTRAVENOUS

## 2019-08-20 MED ORDER — PHENYLEPHRINE HCL (PRESSORS) 10 MG/ML IV SOLN
INTRAVENOUS | Status: DC | PRN
  Administered 2019-08-20 (×7): 80 ug via INTRAVENOUS

## 2019-08-20 MED ORDER — EPINEPHRINE PF 1 MG/ML IJ SOLN
INTRAMUSCULAR | Status: DC | PRN
  Administered 2019-08-20: 1 mg

## 2019-08-20 MED ORDER — POLYETHYLENE GLYCOL 3350 17 G PO PACK: 17.0000 g | PACK | Freq: Every day | ORAL | Status: DC | PRN

## 2019-08-20 MED ORDER — HYDROMORPHONE HCL 1 MG/ML IJ SOLN
0.2500 mg | INTRAMUSCULAR | Status: DC | PRN
  Administered 2019-08-20: 0.5 mg via INTRAVENOUS

## 2019-08-20 SURGICAL SUPPLY — 67 items
ADH SKN CLS APL DERMABOND .7 (GAUZE/BANDAGES/DRESSINGS)
BAG DECANTER FOR FLEXI CONT (MISCELLANEOUS) ×3 IMPLANT
BINDER BREAST LRG (GAUZE/BANDAGES/DRESSINGS) IMPLANT
BINDER BREAST MEDIUM (GAUZE/BANDAGES/DRESSINGS) IMPLANT
BINDER BREAST XLRG (GAUZE/BANDAGES/DRESSINGS) IMPLANT
BINDER BREAST XXLRG (GAUZE/BANDAGES/DRESSINGS) IMPLANT
BIOPATCH RED 1 DISK 7.0 (GAUZE/BANDAGES/DRESSINGS) IMPLANT
BLADE HEX COATED 2.75 (ELECTRODE) ×3 IMPLANT
BLADE KNIFE PERSONA 10 (BLADE) ×6 IMPLANT
BLADE SURG 15 STRL LF DISP TIS (BLADE) IMPLANT
BLADE SURG 15 STRL SS (BLADE)
BNDG GAUZE ELAST 4 BULKY (GAUZE/BANDAGES/DRESSINGS) IMPLANT
CANISTER SUCT 1200ML W/VALVE (MISCELLANEOUS) ×3 IMPLANT
COVER BACK TABLE 60X90IN (DRAPES) ×3 IMPLANT
COVER MAYO STAND STRL (DRAPES) ×3 IMPLANT
COVER WAND RF STERILE (DRAPES) IMPLANT
DECANTER SPIKE VIAL GLASS SM (MISCELLANEOUS) IMPLANT
DERMABOND ADVANCED (GAUZE/BANDAGES/DRESSINGS)
DERMABOND ADVANCED .7 DNX12 (GAUZE/BANDAGES/DRESSINGS) IMPLANT
DRAIN CHANNEL 19F RND (DRAIN) IMPLANT
DRAPE LAPAROSCOPIC ABDOMINAL (DRAPES) ×3 IMPLANT
DRSG OPSITE POSTOP 4X6 (GAUZE/BANDAGES/DRESSINGS) ×3 IMPLANT
DRSG PAD ABDOMINAL 8X10 ST (GAUZE/BANDAGES/DRESSINGS) ×6 IMPLANT
ELECT BLADE 4.0 EZ CLEAN MEGAD (MISCELLANEOUS)
ELECT REM PT RETURN 9FT ADLT (ELECTROSURGICAL) ×3
ELECTRODE BLDE 4.0 EZ CLN MEGD (MISCELLANEOUS) IMPLANT
ELECTRODE REM PT RTRN 9FT ADLT (ELECTROSURGICAL) ×2 IMPLANT
EVACUATOR SILICONE 100CC (DRAIN) IMPLANT
GLOVE BIO SURGEON STRL SZ 6.5 (GLOVE) ×8 IMPLANT
GLOVE BIOGEL PI IND STRL 6.5 (GLOVE) ×1 IMPLANT
GLOVE BIOGEL PI INDICATOR 6.5 (GLOVE) ×1
GOWN STRL REUS W/ TWL LRG LVL3 (GOWN DISPOSABLE) ×4 IMPLANT
GOWN STRL REUS W/TWL LRG LVL3 (GOWN DISPOSABLE) ×6
NDL HYPO 25X1 1.5 SAFETY (NEEDLE) ×1 IMPLANT
NDL SAFETY ECLIPSE 18X1.5 (NEEDLE) IMPLANT
NEEDLE HYPO 18GX1.5 SHARP (NEEDLE)
NEEDLE HYPO 25X1 1.5 SAFETY (NEEDLE) ×3 IMPLANT
NS IRRIG 1000ML POUR BTL (IV SOLUTION) ×3 IMPLANT
PACK BASIN DAY SURGERY FS (CUSTOM PROCEDURE TRAY) ×3 IMPLANT
PAD ALCOHOL SWAB (MISCELLANEOUS) IMPLANT
PENCIL SMOKE EVACUATOR (MISCELLANEOUS) ×3 IMPLANT
SLEEVE SCD COMPRESS KNEE MED (MISCELLANEOUS) ×3 IMPLANT
SPONGE LAP 18X18 RF (DISPOSABLE) ×6 IMPLANT
STRIP SUTURE WOUND CLOSURE 1/2 (MISCELLANEOUS) ×6 IMPLANT
SUT MNCRL AB 4-0 PS2 18 (SUTURE) ×16 IMPLANT
SUT MON AB 3-0 SH 27 (SUTURE) ×9
SUT MON AB 3-0 SH27 (SUTURE) ×4 IMPLANT
SUT MON AB 5-0 PS2 18 (SUTURE) ×6 IMPLANT
SUT PDS 3-0 CT2 (SUTURE)
SUT PDS AB 2-0 CT2 27 (SUTURE) ×4 IMPLANT
SUT PDS II 3-0 CT2 27 ABS (SUTURE) IMPLANT
SUT SILK 3 0 PS 1 (SUTURE) ×4 IMPLANT
SUT VIC AB 3-0 SH 27 (SUTURE)
SUT VIC AB 3-0 SH 27X BRD (SUTURE) IMPLANT
SUT VICRYL 4-0 PS2 18IN ABS (SUTURE) IMPLANT
SYR 3ML 23GX1 SAFETY (SYRINGE) ×3 IMPLANT
SYR 50ML LL SCALE MARK (SYRINGE) IMPLANT
SYR BULB IRRIG 60ML STRL (SYRINGE) ×3 IMPLANT
SYR CONTROL 10ML LL (SYRINGE) ×3 IMPLANT
TAPE MEASURE VINYL STERILE (MISCELLANEOUS) IMPLANT
TOWEL GREEN STERILE FF (TOWEL DISPOSABLE) ×6 IMPLANT
TRAY DSU PREP LF (CUSTOM PROCEDURE TRAY) ×3 IMPLANT
TUBE CONNECTING 20X1/4 (TUBING) ×3 IMPLANT
TUBING INFILTRATION IT-10001 (TUBING) IMPLANT
TUBING SET GRADUATE ASPIR 12FT (MISCELLANEOUS) IMPLANT
UNDERPAD 30X36 HEAVY ABSORB (UNDERPADS AND DIAPERS) ×6 IMPLANT
YANKAUER SUCT BULB TIP NO VENT (SUCTIONS) ×3 IMPLANT

## 2019-08-20 NOTE — Interval H&P Note (Signed)
History and Physical Interval Note:  08/20/2019 8:14 AM  Laurie [name]  has presented today for surgery, with the diagnosis of mammary hypertrophy.  The various methods of treatment have been discussed with the patient and family. After consideration of risks, benefits and other options for treatment, the patient has consented to  Procedure(s): BREAST REDUCTION WITH LIPOSUCTION (Bilateral) as a surgical intervention.  The patient's history has been reviewed, patient examined, no change in status, stable for surgery.  I have reviewed the patient's chart and labs.  Questions were answered to the patient's satisfaction.     Laurie [name]

## 2019-08-20 NOTE — Transfer of Care (Signed)
Immediate Anesthesia Transfer of Care Note  Patient: Laurie [name]  Procedure(s) Performed: MAMMARY REDUCTION  (BREAST) (Breast)  Patient Location: PACU  Anesthesia Type:General  Level of Consciousness: sedated and patient cooperative  Airway & Oxygen Therapy: Patient Spontanous Breathing and Patient connected to face mask oxygen  Post-op Assessment: Report given to RN and Post -op Vital signs reviewed and stable  Post vital signs: Reviewed and stable  Last Vitals:  Vitals Value Taken Time  BP    Temp    Pulse 93 08/20/19 1127  Resp 16 08/20/19 1127  SpO2 100 % 08/20/19 1127  Vitals shown include unvalidated device data.  Last Pain:  Vitals:   08/20/19 0715  TempSrc: Oral  PainSc: 0-No pain         Complications: No complications documented.

## 2019-08-20 NOTE — Op Note (Signed)
Breast Reduction Op note:    DATE OF PROCEDURE: 08/20/2019  LOCATION: [facility]  SURGEON: Alan Ripper Sanger Oakes Mccready, DO  ASSISTANT: Joni Fears, PA  PREOPERATIVE DIAGNOSIS 1. Macromastia 2. Neck Pain 3. Back Pain  POSTOPERATIVE DIAGNOSIS 1. Macromastia 2. Neck Pain 3. Back Pain  PROCEDURES 1. Bilateral breast reduction.  Right reduction 762 g, Left reduction 799 g  COMPLICATIONS: None.  DRAINS: bilateral  INDICATIONS FOR PROCEDURE Laurie [name] is a 54 y.o. year-old female born on Jun 16, 1965,with a history of symptomatic macromastia with concominant back pain, neck pain, shoulder grooving from her bra.   MRN: [id number]  CONSENT Informed consent was obtained directly from the patient. The risks, benefits and alternatives were fully discussed. Specific risks including but not limited to bleeding, infection, hematoma, seroma, scarring, pain, nipple necrosis, asymmetry, poor cosmetic results, and need for further surgery were discussed. The patient had ample opportunity to have her questions answered to her satisfaction.  DESCRIPTION OF PROCEDURE  Patient was brought into the operating room and placed in a supine position.  SCDs were placed and appropriate padding was performed.  Antibiotics were given. The patient underwent general anesthesia and the chest was prepped and draped in a sterile fashion.  A timeout was performed and all information was confirmed to be correct.  Right side: Preoperative markings were confirmed.  Incision lines were injected with 1% Xylocaine with epinephrine.  After waiting for vasoconstriction, the marked lines were incised.  A Wise-pattern superomedial breast reduction was performed by de-epithelializing the pedicle, using bovie to create the superomedial pedicle, and removing breast tissue from the inferior portion of the breast with a small amount from the lateral and superior lateral area.  Care was taken to not  undermine the breast pedicle. Hemostasis was achieved.  The nipple was gently rotated into position and the soft tissue closed with 4-0 Monocryl.   The pocket was irrigated and hemostasis confirmed.  The deep tissues were approximated with 3-0 Monocryl sutures and the skin was closed with deep dermal and subcuticular 4-0 Monocryl sutures.  The nipple and skin flaps had good capillary refill at the end of the procedure.    Left side: Preoperative markings were confirmed.  Incision lines were injected with 1% Xylocaine with epinephrine.  After waiting for vasoconstriction, the marked lines were incised.  A Wise-pattern superomedial breast reduction was performed by de-epithelializing the pedicle, using bovie to create the superomedial pedicle, and removing breast tissue from the inferior portion of the breast with a small amount from the lateral and superior lateral area.  Care was taken to not undermine the breast pedicle. Hemostasis was achieved.  The nipple was gently rotated into position and the soft tissue was closed with 4-0 Monocryl.  The patient was sat upright and size and shape symmetry was confirmed.  The pocket was irrigated and hemostasis confirmed.  The deep tissues were approximated with 3-0 Monocryl sutures and the skin was closed with deep dermal and subcuticular 4-0 Monocryl sutures.  Dermabond was applied.  A breast binder and ABDs were placed.  The nipple and skin flaps had good capillary refill at the end of the procedure.  The patient tolerated the procedure well. The patient was allowed to wake from anesthesia and taken to the recovery room in satisfactory condition.  The advanced practice practitioner (APP) assisted throughout the case.  The APP was essential in retraction and counter traction when needed to make the case progress smoothly.  This retraction and assistance made  it possible to see the tissue plans for the procedure.  The assistance was needed for blood control, tissue  re-approximation and assisted with closure of the incision site.

## 2019-08-20 NOTE — Discharge Instructions (Addendum)
INSTRUCTIONS FOR AFTER SURGERY   You will likely have some questions about what to expect following your operation.  The following information will help you and your family understand what to expect when you are discharged from the hospital.  Following these guidelines will help ensure a smooth recovery and reduce risks of complications.  Postoperative instructions include information on: diet, wound care, medications and physical activity.  AFTER SURGERY Expect to go home after the procedure.  In some cases, you may need to spend one night in the hospital for observation.  DIET This surgery does not require a specific diet.  However, I have to mention that the healthier you eat the better your body can start healing. It is important to increasing your protein intake.  This means limiting the foods with added sugar.  Focus on fruits and vegetables and some meat.  If you have any liposuction during your procedure be sure to drink water.  If your urine is bright yellow, then it is concentrated, and you need to drink more water.  As a general rule after surgery, you should have 8 ounces of water every hour while awake.  If you find you are persistently nauseated or unable to take in liquids let us know.  NO TOBACCO USE or EXPOSURE.  This will slow your healing process and increase the risk of a wound.  WOUND CARE If you don't have a drain: You can shower the day after surgery.  Use fragrance free soap.  Dial, Dove, Ivory and Cetaphil are usually mild on the skin.  If you have a drain: Clean with baby wipes until the drain is removed.   If you have steri-strips / tape directly attached to your skin leave them in place. It is OK to get these wet.  No baths, pools or hot tubs for two weeks. We close your incision to leave the smallest and best-looking scar. No ointment or creams on your incisions until given the go ahead.  Especially not Neosporin (Too many skin reactions with this one).  A few weeks after  surgery you can use Mederma and start massaging the scar. We ask you to wear your binder or sports bra for the first 6 weeks around the clock, including while sleeping. This provides added comfort and helps reduce the fluid accumulation at the surgery site.  ACTIVITY No heavy lifting until cleared by the doctor.  It is OK to walk and climb stairs. In fact, moving your legs is very important to decrease your risk of a blood clot.  It will also help keep you from getting deconditioned.  Every 1 to 2 hours get up and walk for 5 minutes. This will help with a quicker recovery back to normal.  Let pain be your guide so you don't do too much.  NO, you cannot do the spring cleaning and don't plan on taking care of anyone else.  This is your time for TLC.   WORK Everyone returns to work at different times. As a rough guide, most people take at least 1 - 2 weeks off prior to returning to work. If you need documentation for your job, bring the forms to your postoperative follow up visit.  DRIVING Arrange for someone to bring you home from the hospital.  You may be able to drive a few days after surgery but not while taking any narcotics or valium.  BOWEL MOVEMENTS Constipation can occur after anesthesia and while taking pain medication.  It is important   to stay ahead for your comfort.  We recommend taking Milk of Magnesia (2 tablespoons; twice a day) while taking the pain pills.  SEROMA This is fluid your body tried to put in the surgical site.  This is normal but if it creates excessive pain and swelling let us know.  It usually decreases in a few weeks.  MEDICATIONS and PAIN CONTROL At your preoperative visit for you history and physical you were given the following medications: 1. An antibiotic: Start this medication when you get home and take according to the instructions on the bottle. 2. Zofran 4 mg:  This is to treat nausea and vomiting.  You can take this every 6 hours as needed and only if  needed. 3. Norco (hydrocodone/acetaminophen) 5/325 mg:  This is only to be used after you have taken the motrin or the tylenol. Every 8 hours as needed. Over the counter Medication to take: 4. Ibuprofen (Motrin) 600 mg:  Take this every 6 hours.  If you have additional pain then take 500 mg of the tylenol.  Only take the Norco after you have tried these two. 5. Miralax or stool softener of choice: Take this according to the bottle if you take the Norco.  WHEN TO CALL Call your surgeon's office if any of the following occur: . Fever 101 degrees F or greater . Excessive bleeding or fluid from the incision site. . Pain that increases over time without aid from the medications . Redness, warmth, or pus draining from incision sites . Persistent nausea or inability to take in liquids . Severe misshapen area that underwent the operation.     Post Anesthesia Home Care Instructions  Activity: Get plenty of rest for the remainder of the day. A responsible individual must stay with you for 24 hours following the procedure.  For the next 24 hours, DO NOT: -Drive a car -Advertising copywriter -Drink alcoholic beverages -Take any medication unless instructed by your physician -Make any legal decisions or sign important papers.  Meals: Start with liquid foods such as gelatin or soup. Progress to regular foods as tolerated. Avoid greasy, spicy, heavy foods. If nausea and/or vomiting occur, drink only clear liquids until the nausea and/or vomiting subsides. Call your physician if vomiting continues.  Special Instructions/Symptoms: Your throat may feel dry or sore from the anesthesia or the breathing tube placed in your throat during surgery. If this causes discomfort, gargle with warm salt water. The discomfort should disappear within 24 hours.      About my Jackson-Pratt Bulb Drain  What is a Jackson-Pratt bulb? A Jackson-Pratt is a soft, round device used to collect drainage. It is connected to a  long, thin drainage catheter, which is held in place by one or two small stiches near your surgical incision site. When the bulb is squeezed, it forms a vacuum, forcing the drainage to empty into the bulb.  Emptying the Jackson-Pratt bulb- To empty the bulb: 1. Release the plug on the top of the bulb. 2. Pour the bulb's contents into a measuring container which your nurse will provide. 3. Record the time emptied and amount of drainage. Empty the drain(s) as often as your     doctor or nurse recommends.  Date                  Time                    Amount (Drain 1)  Amount (Drain 2)  _____________________________________________________________________  _____________________________________________________________________  _____________________________________________________________________  _____________________________________________________________________  _____________________________________________________________________  _____________________________________________________________________  _____________________________________________________________________  _____________________________________________________________________  Squeezing the Jackson-Pratt Bulb- To squeeze the bulb: 1. Make sure the plug at the top of the bulb is open. 2. Squeeze the bulb tightly in your fist. You will hear air squeezing from the bulb. 3. Replace the plug while the bulb is squeezed. 4. Use a safety pin to attach the bulb to your clothing. This will keep the catheter from     pulling at the bulb insertion site.  When to call your doctor- Call your doctor if:  Drain site becomes red, swollen or hot.  You have a fever greater than 101 degrees F.  There is oozing at the drain site.  Drain falls out (apply a guaze bandage over the drain hole and secure it with tape).  Drainage increases daily not related to activity patterns. (You will usually have more drainage when  you are active than when you are resting.)  Drainage has a bad odor.

## 2019-08-20 NOTE — Anesthesia Postprocedure Evaluation (Signed)
Anesthesia Post Note  Patient: Laurie [name]  Procedure(s) Performed: MAMMARY REDUCTION  (BREAST) (Breast)     Patient location during evaluation: PACU Anesthesia Type: General Level of consciousness: awake and alert and oriented Pain management: pain level controlled Vital Signs Assessment: post-procedure vital signs reviewed and stable Respiratory status: spontaneous breathing, nonlabored ventilation and respiratory function stable Cardiovascular status: blood pressure returned to baseline Postop Assessment: no apparent nausea or vomiting Anesthetic complications: no   No complications documented.  Last Vitals:  Vitals:   08/20/19 1230 08/20/19 1314  BP: 120/72 115/84  Pulse: 76 91  Resp: 15 16  Temp:  36.7 C  SpO2: 99% 98%    Last Pain:  Vitals:   08/20/19 1314  TempSrc:   PainSc: 5                  Kaylyn Layer

## 2019-08-20 NOTE — Anesthesia Procedure Notes (Signed)
Procedure Name: Intubation Date/Time: 08/20/2019 8:39 AM Performed by: [place] [facility], CRNA Pre-anesthesia Checklist: Patient identified, Emergency Drugs available, Suction available, Patient being monitored and Timeout performed Patient Re-evaluated:Patient Re-evaluated prior to induction Oxygen Delivery Method: Circle system utilized Preoxygenation: Pre-oxygenation with 100% oxygen Induction Type: IV induction Ventilation: Mask ventilation without difficulty Laryngoscope Size: Miller and 5 Grade View: Grade II Tube type: Oral Tube size: 7.0 mm Number of attempts: 1 Airway Equipment and Method: Stylet and Oral airway Placement Confirmation: ETT inserted through vocal cords under direct vision,  positive ETCO2 and breath sounds checked- equal and bilateral Secured at: 20 cm Tube secured with: Tape Dental Injury: Teeth and Oropharynx as per pre-operative assessment

## 2019-08-21 ENCOUNTER — Telehealth: Payer: Self-pay

## 2019-08-21 ENCOUNTER — Encounter (HOSPITAL_BASED_OUTPATIENT_CLINIC_OR_DEPARTMENT_OTHER): Payer: Self-pay | Admitting: Plastic Surgery

## 2019-08-21 LAB — SURGICAL PATHOLOGY

## 2019-08-21 NOTE — Telephone Encounter (Signed)
Spoke with patient. Patient was able to work the clot out of the drain tube and into the drain bulb, so it is not blocking the drain.  Answered all her questions to her satisfaction.

## 2019-08-21 NOTE — Discharge Summary (Signed)
Physician Discharge Summary  Patient ID: Laurie [name] MRN: [id number] DOB/AGE: 53-15-1967 53 y.o.  Admit date: 08/20/2019 Discharge date: 08/21/2019  Admission Diagnoses: Symptomatic mammary hypertrophy  Discharge Diagnoses:  Active Problems:   Symptomatic mammary hypertrophy   Discharged Condition: good  Hospital Course: Patient is sitting up in bed with friend at her bedside. No overnight events. Overall she reports she's doing well. Incisions are clean, dry, and intact. Some sanguinous drainage on the right bandage. Honeycomb and steristrips in place.  Bilateral Drains in place with serosanguinous fluid present.  Patient denies CP, SOB, F, N/V.   Consults: None  Significant Diagnostic Studies: none  Treatments: surgery: bilateral breast reduction  Discharge Exam: Blood pressure 129/78, pulse 91, temperature 99.2 F (37.3 C), resp. rate 18, height 5\' 7"  (1.702 m), weight 103 kg, last menstrual period 07/14/2013, SpO2 100 %. General appearance: alert, cooperative and no distress Head: Normocephalic, without obvious abnormality, atraumatic Eyes: EOMs intact Resp: nonlabored Chest wall: symmetric rise and fall. Bilateral incision clean, dry, and intact; dried snaguinous drainage on gauze of right breast. Bilateral drains in place with serosanguinous fluid. No signs of infection, redness, seroma/hematoma. NAC viable with good cap refill.  Disposition: Discharge disposition: 01-Home or Self Care       Discharge Instructions    Call MD for:  difficulty breathing, headache or visual disturbances   Complete by: As directed    Call MD for:  extreme fatigue   Complete by: As directed    Call MD for:  hives   Complete by: As directed    Call MD for:  persistant dizziness or light-headedness   Complete by: As directed    Call MD for:  persistant nausea and vomiting   Complete by: As directed    Call MD for:  redness, tenderness, or signs of infection (pain, swelling,  redness, odor or green/yellow discharge around incision site)   Complete by: As directed    Call MD for:  severe uncontrolled pain   Complete by: As directed    Call MD for:  temperature >100.4   Complete by: As directed    Diet - low sodium heart healthy   Complete by: As directed    Increase activity slowly   Complete by: As directed      Allergies as of 08/21/2019      Reactions   Sulfa Antibiotics Swelling   Sulfasalazine Swelling   Diclofenac Hives      Medication List    TAKE these medications   acetaminophen 500 MG tablet Commonly known as: TYLENOL Take 2 tablets (1,000 mg total) by mouth every 8 (eight) hours.   allopurinol 100 MG tablet Commonly known as: ZYLOPRIM Take 100 mg by mouth daily.   BENEFIBER DRINK MIX PO Take 1 Scoop by mouth daily.   Biotin 5000 5 MG Caps Generic drug: Biotin Take 5 mg by mouth daily.   calcium-vitamin D 500-200 MG-UNIT tablet Commonly known as: OSCAL WITH D Take 1 tablet by mouth daily with breakfast.   fluticasone 50 MCG/ACT nasal spray Commonly known as: FLONASE Place 2 sprays into both nostrils daily as needed for allergies.   folic acid 1 MG tablet Commonly known as: FOLVITE Take 1 mg by mouth daily.   gabapentin 300 MG capsule Commonly known as: NEURONTIN Take 300 mg by mouth 2 (two) times daily.   lubiprostone 24 MCG capsule Commonly known as: AMITIZA Take 24 mcg by mouth daily as needed for constipation.   metoprolol succinate  25 MG 24 hr tablet Commonly known as: TOPROL-XL Take 1 tablet (25 mg total) by mouth daily.   multivitamins ther. w/minerals Tabs tablet Take 1 tablet by mouth daily.   ondansetron 4 MG tablet Commonly known as: Zofran Take 1 tablet (4 mg total) by mouth every 8 (eight) hours as needed for nausea or vomiting.   senna 8.6 MG tablet Commonly known as: SENOKOT Take 3 tablets by mouth daily.       Follow-up Information    Dillingham, Alena Bills, DO In 1 week.   Specialty: Plastic  Surgery Contact information: [address] [place] [address] [phone]              Signed: Eldridge Abrahams 08/21/2019, 7:06 AM

## 2019-08-21 NOTE — Telephone Encounter (Signed)
Patient called to state her drains are blocked due to a blood clot. She would like a call back.

## 2019-08-22 ENCOUNTER — Telehealth: Payer: Self-pay

## 2019-08-22 NOTE — Telephone Encounter (Signed)
Spoke with patient.  Pain is mostly around the drain site. She can put some gauze under the drain line to provide some cushion to make it more comfortable.  She can also apply ice to the drain area. She should wait until Sunday to shower.

## 2019-08-22 NOTE — Telephone Encounter (Signed)
Patient called requesting call back. She is having some pain and has questions about showering over the weekend.

## 2019-08-28 DIAGNOSIS — Z9889 Other specified postprocedural states: Secondary | ICD-10-CM | POA: Insufficient documentation

## 2019-08-28 NOTE — Progress Notes (Signed)
Subjective:     Patient ID: Laurie [name], female    DOB: 10-Oct-1965, 54 y.o.   MRN: [id number]  Chief Complaint  Patient presents with  . Post-op Follow-up    HPI: The patient is a 54 y.o. female here for follow-up after undergoing bilateral breast reduction on 08/20/2019 with Dr. Ulice Bold.  (Right reduction: 762 g; left reduction: 799 g)  ~ 1 week PO Patient is doing well.  Incisions are feeling very nicely, C/D/I.  No signs of infection, redness, drainage, seroma/hematoma.  Breasts are soft.  Bilateral drains in place with output just slightly over 20 cc/day.  Patient denies fever, chest pain, shortness of breath, nausea/vomiting. +BM.  Patient reports soreness but overall pain is well controlled.  Review of Systems  Constitutional: Negative for chills and fever.  Respiratory: Negative for shortness of breath.   Cardiovascular: Negative for chest pain and leg swelling.  Gastrointestinal: Negative for constipation, diarrhea, nausea and vomiting.  Skin: Negative for color change, pallor and rash.     Objective:   Vital Signs BP (!) 143/86 (BP Location: Right Arm, Patient Position: Sitting, Cuff Size: Normal)   Pulse 88   LMP 07/14/2013 Comment: no periods >77yrs  SpO2 100%  Vital Signs and Nursing Note Reviewed  Physical Exam Constitutional:      General: She is not in acute distress.    Appearance: Normal appearance. She is not ill-appearing.  HENT:     Head: Normocephalic and atraumatic.  Eyes:     Extraocular Movements: Extraocular movements intact.  Cardiovascular:     Rate and Rhythm: Normal rate.  Pulmonary:     Effort: Pulmonary effort is normal.  Chest:     Comments: Incisions are healing very nicely, C/D/I.  No signs of infection, redness, drainage, seroma/hematoma.  Breasts are soft.  Bilateral drains in place with slightly over 20 cc per 24-hour period.  Mild bilateral diffuse swelling. Musculoskeletal:        General: Normal range of motion.      Cervical back: Normal range of motion.  Skin:    General: Skin is warm and dry.     Coloration: Skin is not pale.     Findings: No erythema or rash.  Neurological:     General: No focal deficit present.     Mental Status: She is alert and oriented to person, place, and time.     Gait: Gait is intact.  Psychiatric:        Mood and Affect: Mood and affect normal.        Cognition and Memory: Memory normal.        Judgment: Judgment normal.       Assessment/Plan:     ICD-10-CM   1. S/P bilateral breast reduction  Z98.890    Patient is doing very well.  Her incisions are healing very nicely, C/D/I.  No signs of infection, redness, drainage, seroma/hematoma.  Pain is well controlled.  Bilateral drains in place with greater than 20 cc per 24-hours.  Mild bilateral diffuse swelling.  Patient reports she did feel better leaving the drains and just a little bit longer.  We will plan to pull drains on Monday.  Continue to wear sports bra 24/7.  Continue to avoid heavy lifting.  May shower placing soap on shoulders and allowing water to hit the shoulders and just run down across the chest.  Follow-up on Monday to pull the drains.  Call office with any questions/concerns.  The 21st Century  Cures Act was signed into law in 2016 which includes the topic of electronic health records.  This provides immediate access to information in MyChart.  This includes consultation notes, operative notes, office notes, lab results and pathology reports.  If you have any questions about what you read please let us know at your next visit or call us at the office.  We are right here with you.   Eldridge Abrahams, PA-C 08/29/2019, 2:34 PM

## 2019-08-29 ENCOUNTER — Other Ambulatory Visit: Payer: Self-pay

## 2019-08-29 ENCOUNTER — Ambulatory Visit (INDEPENDENT_AMBULATORY_CARE_PROVIDER_SITE_OTHER): Payer: BC Managed Care – PPO | Admitting: Plastic Surgery

## 2019-08-29 ENCOUNTER — Encounter: Payer: Self-pay | Admitting: Plastic Surgery

## 2019-08-29 DIAGNOSIS — Z9889 Other specified postprocedural states: Secondary | ICD-10-CM

## 2019-08-31 NOTE — Progress Notes (Signed)
Patient is a 54 yr-old female here for follow-up after undergoing bilateral breast reduction on 08/20/19 with Dr. Ulice Bold. (Right: [phone], Left: 799g)  ~ 2 weeks PO Patient is doing very well today.  Incisions are healing nicely, C/D/I.  No signs of infection, redness, drainage, or seroma/hematoma.  Mild bilateral diffuse swelling present.  Steri-Strips in place along vertical limbs.  Honeycomb dressings removed today bilaterally.  Bilateral drains in place with serosanguineous fluid in the bulb.  Patient reports 20 cc or less over 24 hours for the last several days.  Bilateral drains removed today.  Do daily dressing changes to drain sites consisting of Vaseline and gauze or Band-Aid.  Continue to wear compression garment 24/7 until 6 weeks postop.  Continue to avoid heavy lifting.  Keep follow-up appointment scheduled with Dr. Ulice Bold on August 13.  Call office with any questions/concerns.  The 21st Century Cures Act was signed into law in 2016 which includes the topic of electronic health records.  This provides immediate access to information in MyChart.  This includes consultation notes, operative notes, office notes, lab results and pathology reports.  If you have any questions about what you read please let us know at your next visit or call us at the office.  We are right here with you.

## 2019-09-01 ENCOUNTER — Other Ambulatory Visit: Payer: Self-pay

## 2019-09-01 ENCOUNTER — Encounter: Payer: Self-pay | Admitting: Plastic Surgery

## 2019-09-01 ENCOUNTER — Telehealth: Payer: Self-pay | Admitting: Plastic Surgery

## 2019-09-01 ENCOUNTER — Ambulatory Visit (INDEPENDENT_AMBULATORY_CARE_PROVIDER_SITE_OTHER): Payer: BC Managed Care – PPO | Admitting: Plastic Surgery

## 2019-09-01 VITALS — BP 134/84 | HR 90 | Temp 98.3°F

## 2019-09-01 DIAGNOSIS — Z9889 Other specified postprocedural states: Secondary | ICD-10-CM

## 2019-09-01 NOTE — Telephone Encounter (Signed)
Patient called to advise she is only draining 20 and Laurie [name] wanted her to be draining 10 for each breast. Does she need to reschedule her appt to allow for more time. Patient currently scheduled for today at 1pm. Please call to advise

## 2019-09-01 NOTE — Telephone Encounter (Signed)
Returned patients call. She is now draining in 24 hour period.  She wanted to make sure her they were coming out today since she had another follow up 09/12/19. Read Laurie [name]'s notes from 08/29/19 which indicated her drains will come out today if she is drain or less with in 24 hour period.  She is coming in at her 1:00 appointment as originally planned

## 2019-09-08 ENCOUNTER — Other Ambulatory Visit: Payer: Self-pay | Admitting: Internal Medicine

## 2019-09-08 DIAGNOSIS — Z1231 Encounter for screening mammogram for malignant neoplasm of breast: Secondary | ICD-10-CM

## 2019-09-12 ENCOUNTER — Other Ambulatory Visit: Payer: Self-pay

## 2019-09-12 ENCOUNTER — Ambulatory Visit (INDEPENDENT_AMBULATORY_CARE_PROVIDER_SITE_OTHER): Payer: BC Managed Care – PPO | Admitting: Plastic Surgery

## 2019-09-12 ENCOUNTER — Encounter: Payer: Self-pay | Admitting: Plastic Surgery

## 2019-09-12 VITALS — BP 152/92 | HR 88 | Temp 97.8°F

## 2019-09-12 DIAGNOSIS — Z9889 Other specified postprocedural states: Secondary | ICD-10-CM

## 2019-09-12 NOTE — Progress Notes (Signed)
The patient is a 54 year old female here for follow-up on her breast reduction.  Surgery was done July 21.  Overall she is doing very well.  She still has a little bit of swelling at the inframammary fold.  She also thinks that the left breast looks a little smaller than the right.  The left nipple areola is a little bit lower by half a centimeter on the left compared to the right.  There is no sign of seroma or hematoma.  The incisions are healing well.  Keep Steri-Strips in place.  Continue with the Motrin to help reduce the swelling.  I like to see her back in few weeks.  Continue with the sports bra.  We took pictures with her permission and placed them in her chart.  We also reviewed her preop and her picture from today.  There is a significant improvement.  If we need to do a slight revision this most likely could be done in the office depending on how things settle and the swelling resolves.

## 2019-09-25 NOTE — Progress Notes (Signed)
Patient is a 54 year old female here for follow-up after undergoing bilateral breast reduction on 08/20/2019 with Dr. Ulice Bold. (Right reduction 762 g; left reduction 799 g)  ~ 5 weeks PO Patient's incisions are healing very nicely, C/D/I.  Remaining Steri-Strips removed.  No signs of infection, redness, drainage, seroma/hematoma.  Mild diffuse bilateral swelling present, > on the lateral aspects.  Patient is concerned that left NAC is slightly lower and more lateral than the right.  She is also concerned with fullness at the lateral aspects of her bilateral incisions.  Recommend allowing some more time for healing before making a determination if any revisions are needed.  Patient denies fever/chills, nausea/vomiting.  Continue to wear sports bra 24/7 for 1 more week.  Then she can just wear sports bra during the day.  May gradually increase activity as tolerated.  Patient would like to continue with follow-up in 2 weeks.  Call office with any questions/concerns.  Patient is also interested in setting up a consultation for skin care to address hyperpigmentation and some uneven texture of her skin.  She is also concerned with come skin reactions after waxing facial hair. Sh would also be a good candidate to explore laser hair removal for her cheeks and chin.  The 21st Century Cures Act was signed into law in 2016 which includes the topic of electronic health records.  This provides immediate access to information in MyChart.  This includes consultation notes, operative notes, office notes, lab results and pathology reports.  If you have any questions about what you read please let us know at your next visit or call us at the office.  We are right here with you.

## 2019-09-26 ENCOUNTER — Ambulatory Visit (INDEPENDENT_AMBULATORY_CARE_PROVIDER_SITE_OTHER): Payer: BC Managed Care – PPO | Admitting: Plastic Surgery

## 2019-09-26 ENCOUNTER — Other Ambulatory Visit: Payer: Self-pay

## 2019-09-26 ENCOUNTER — Encounter: Payer: Self-pay | Admitting: Plastic Surgery

## 2019-09-26 VITALS — BP 129/84 | HR 85 | Temp 98.5°F

## 2019-09-26 DIAGNOSIS — Z9889 Other specified postprocedural states: Secondary | ICD-10-CM

## 2019-09-28 NOTE — Progress Notes (Signed)
Subjective:     Patient ID: Laurie [name], female    DOB: [birth year], 54 y.o.   MRN: [id number]  Chief Complaint  Patient presents with  . Consult    HPI: The patient is a 54 y.o. female who presents for skin care consultation.  Her primary concern is hyperpigmentation on forehead, cheeks, and back of neck.  She also reports breakouts on her chin and cheeks after waxing.  Patient reports her skin is oily.  Her daily skin care routine consists of cleansing, exfoliation, toner and sometimes various other products.  She reports occasional use of sunscreen.  She currently waxes her cheeks and chin to remove undesired hair.  Review of Systems  Constitutional: Negative for appetite change, chills, fatigue and fever.  Gastrointestinal: Negative for nausea and vomiting.  Skin: Positive for color change (hyperpigmentation on forehead, cheeks, and neck). Negative for pallor, rash and wound.       Undesired facial hair on cheeks/chin. Breakouts after waxing.     Objective:   Vital Signs BP 139/90 (BP Location: Left Wrist, Patient Position: Sitting, Cuff Size: Normal)   Pulse 98   Temp 98.7 F (37.1 C) (Oral)   LMP 07/14/2013 Comment: no periods >16yr  SpO2 100%  Vital Signs and Nursing Note Reviewed  Physical Exam Constitutional:      General: She is not in acute distress.    Appearance: Normal appearance. She is not ill-appearing.  HENT:     Head: Normocephalic and atraumatic.  Eyes:     Extraocular Movements: Extraocular movements intact.  Neck:     Comments: Hyperpigmentation along inferior back of neck Pulmonary:     Effort: Pulmonary effort is normal.  Musculoskeletal:     Cervical back: Normal range of motion.  Skin:    General: Skin is warm and dry.     Coloration: Skin is not jaundiced or pale.     Findings: No erythema or rash.     Comments: Significant hyperpigmentation on forehead and cheeks. Facial hair present on cheeks/chin.  Minimal fine lines and  wrinkles. Dark circles under eyes. General signs of aging of face.  Neurological:     General: No focal deficit present.     Mental Status: She is alert and oriented to person, place, and time. Mental status is at baseline.     Gait: Gait is intact.  Psychiatric:        Mood and Affect: Mood and affect normal.        Behavior: Behavior normal.        Thought Content: Thought content normal.        Cognition and Memory: Memory normal.        Judgment: Judgment normal.       Assessment/Plan:     ICD-10-CM   1. Hyperpigmentation of skin  L81.9    Ms. Strahan has severe melasma on her forehead and cheeks along with unwanted facial hair on her cheeks and chin that she currently waxes. She has good texture to her skin with minimal fine lines and wrinkles. Experiences occasional acne, especially after waxing. We discussed various options for melasma treatment and the anticipated reactions associated with each. Ms. HRhinesreports she is willing to tolerate severe anticipated reactions. Discussed that she must utilize sunscreen daily both to protect her skin from future damage and to protect her skin while she uses stronger products that will increase her risk of sunburn. Discussed that hyperpigmentation on her neck may be a sign  of diabetes, acanthosis nigricans. Patient reports she is pre-diabetic. Discussed the importance of keeping her sugars/pre-diabetes well controlled.   Recommendations: 1) ZO Brightening Protocol - Hydroquinone Rx - For maximum of 5 months and then transition to Moderate Melasma Protocol for maintenance.  2) Laser Hair Removal  - Recommend she consider laser hair removal for her face as waxing can increase hyperpigmentation. She cannot wax when using hydroquinone products. For laser treatments, she will need to stop using the hydroquinone and tretinoin 3-5 days prior to laser treatment and resume once any laser reaction subsides.  Recommended ZO Products: 1) Gentle  or Exfoliating Cleanser (am & pm) 2) Exfoliating Polish (am or pm) 3) Complexion Renewal Pads (am & pm)  4) Daily Power Defense (am or pm) 5) Pigment Control Cream - Rx (am) 6) Pigment Control + Blending Cream - Rx (pm) - mixed with tretinoin (1 pump cream + 1/4 inch strip of tretinoin) 7) Tretinion -Rx (pm) - mixed with Pigment Control + Blending Cream 8 ) Eye Brightening Cream (am & pm) 9) Broad Spectrum Sunscreen SPF 50 or Sunscreen + Powder Broad Spectrum SPF 40 - Dark (am) 10) Hydrating Cream (am & pm)  Pigment Control Program + Hydroquinone Kit contains items 1-6. Items 4-7 will target the hyperpigmentation.  Patient was provided with pricing on the items and laser treatments. She will review and decide if she wants to try a kit or order individual full size items. Follow up 4 weeks after she begins using products. Call office with any questions/concerns.   The [place] was signed into law in 2016 which includes the topic of electronic health records.  This provides immediate access to information in MyChart.  This includes consultation notes, operative notes, office notes, lab results and pathology reports.  If you have any questions about what you read please let us know at your next visit or call us at the office.  We are right here with you.   Threasa Heads, PA-C 09/29/2019, 8:15 PM

## 2019-09-29 ENCOUNTER — Encounter: Payer: Self-pay | Admitting: Plastic Surgery

## 2019-09-29 ENCOUNTER — Ambulatory Visit (INDEPENDENT_AMBULATORY_CARE_PROVIDER_SITE_OTHER): Payer: BC Managed Care – PPO | Admitting: Plastic Surgery

## 2019-09-29 ENCOUNTER — Other Ambulatory Visit: Payer: Self-pay

## 2019-09-29 VITALS — BP 139/90 | HR 98 | Temp 98.7°F

## 2019-09-29 DIAGNOSIS — R7303 Prediabetes: Secondary | ICD-10-CM | POA: Diagnosis not present

## 2019-09-29 DIAGNOSIS — F419 Anxiety disorder, unspecified: Secondary | ICD-10-CM | POA: Diagnosis not present

## 2019-09-29 DIAGNOSIS — E669 Obesity, unspecified: Secondary | ICD-10-CM | POA: Diagnosis not present

## 2019-09-29 DIAGNOSIS — M169 Osteoarthritis of hip, unspecified: Secondary | ICD-10-CM | POA: Diagnosis not present

## 2019-09-29 DIAGNOSIS — Z6835 Body mass index (BMI) 35.0-35.9, adult: Secondary | ICD-10-CM | POA: Diagnosis not present

## 2019-09-29 DIAGNOSIS — L819 Disorder of pigmentation, unspecified: Secondary | ICD-10-CM

## 2019-10-09 NOTE — Progress Notes (Signed)
Patient is a 54 yr-old female here for follow-up after undergoing bilateral breast reduction on 08/20/19 with Dr. Ulice Bold (Right reduction: 762 g; Left Redution: 799 g) Patient is concerned that left NAC is slightly lower and more lateral than the right.  She is also concerned with fullness at the lateral aspects of her bilateral incisions.  ~ 7 weeks PO Patient is healing very nicely.  Her incisions are healing well, C/D/I.  No signs of infection, redness, drainage, seroma/hematoma.  She denies fever/chills, nausea/vomiting.  Patient expresses concern that her nipples have not " popped out" yet and are numb.  She also expresses concern about fullness in the lateral area under her arms which she feels are " dog ears".  Discussed with patient that nerves are slow to heal and it may be several months up to a year until final sensation is achieved.  She should continue to give it some time and apply Vaseline or other unscented moisturizer to the NAC and surrounding breast tissue.  Recommend scar cream along her incisions at this point.  Follow-up in 4 to 5 weeks.  Call office with any questions/concerns.  Pictures were obtained of the patient and placed in the chart with the patient's or guardian's permission.  The 21st Century Cures Act was signed into law in 2016 which includes the topic of electronic health records.  This provides immediate access to information in MyChart.  This includes consultation notes, operative notes, office notes, lab results and pathology reports.  If you have any questions about what you read please let us know at your next visit or call us at the office.  We are right here with you.

## 2019-10-10 ENCOUNTER — Ambulatory Visit (INDEPENDENT_AMBULATORY_CARE_PROVIDER_SITE_OTHER): Payer: BC Managed Care – PPO | Admitting: Plastic Surgery

## 2019-10-10 ENCOUNTER — Other Ambulatory Visit: Payer: Self-pay

## 2019-10-10 ENCOUNTER — Encounter: Payer: Self-pay | Admitting: Plastic Surgery

## 2019-10-10 VITALS — BP 137/99 | HR 93 | Temp 99.0°F

## 2019-10-10 DIAGNOSIS — Z719 Counseling, unspecified: Secondary | ICD-10-CM

## 2019-10-10 DIAGNOSIS — Z9889 Other specified postprocedural states: Secondary | ICD-10-CM

## 2019-10-29 DIAGNOSIS — Z23 Encounter for immunization: Secondary | ICD-10-CM | POA: Diagnosis not present

## 2019-10-29 DIAGNOSIS — Z113 Encounter for screening for infections with a predominantly sexual mode of transmission: Secondary | ICD-10-CM | POA: Diagnosis not present

## 2019-10-29 DIAGNOSIS — Z1231 Encounter for screening mammogram for malignant neoplasm of breast: Secondary | ICD-10-CM | POA: Diagnosis not present

## 2019-10-29 DIAGNOSIS — Z01419 Encounter for gynecological examination (general) (routine) without abnormal findings: Secondary | ICD-10-CM | POA: Diagnosis not present

## 2019-10-30 DIAGNOSIS — K5904 Chronic idiopathic constipation: Secondary | ICD-10-CM | POA: Diagnosis not present

## 2019-10-30 DIAGNOSIS — K219 Gastro-esophageal reflux disease without esophagitis: Secondary | ICD-10-CM | POA: Diagnosis not present

## 2019-10-30 DIAGNOSIS — M609 Myositis, unspecified: Secondary | ICD-10-CM | POA: Diagnosis not present

## 2019-10-30 DIAGNOSIS — N62 Hypertrophy of breast: Secondary | ICD-10-CM | POA: Diagnosis not present

## 2019-11-18 ENCOUNTER — Telehealth: Payer: Self-pay

## 2019-11-18 ENCOUNTER — Other Ambulatory Visit: Payer: Self-pay

## 2019-11-18 ENCOUNTER — Ambulatory Visit (INDEPENDENT_AMBULATORY_CARE_PROVIDER_SITE_OTHER): Payer: BC Managed Care – PPO | Admitting: Plastic Surgery

## 2019-11-18 ENCOUNTER — Ambulatory Visit: Payer: BC Managed Care – PPO | Admitting: Plastic Surgery

## 2019-11-18 ENCOUNTER — Encounter: Payer: Self-pay | Admitting: Plastic Surgery

## 2019-11-18 DIAGNOSIS — Z9889 Other specified postprocedural states: Secondary | ICD-10-CM

## 2019-11-18 NOTE — Progress Notes (Signed)
   Subjective:    Patient ID: Laurie [name], female    DOB: Oct 19, 1965, 54 y.o.   MRN: [id number]  The patient is a 54 yrs old bf here for follow up after her breast reduction in July.  The incisions are healing very nicely.  There is no sign of infection.  She is pleased with her results.  She would like to have a little bit further reduction on the lateral aspect where she has a little bit of excess tissue.  She also wants to have full body liposuction with an abdominoplasty and a [place] butt lift.  I made it clear that I do not do a [place] butt lift.       Review of Systems  Constitutional: Negative.  Negative for activity change and appetite change.  HENT: Negative.   Eyes: Negative.   Respiratory: Negative.   Cardiovascular: Negative.  Negative for leg swelling.  Gastrointestinal: Negative for abdominal distention.  Endocrine: Negative.   Genitourinary: Negative.   Musculoskeletal: Negative for gait problem.  Hematological: Negative.        Objective:   Physical Exam Vitals and nursing note reviewed.  Constitutional:      Appearance: Normal appearance.  HENT:     Head: Normocephalic and atraumatic.  Cardiovascular:     Rate and Rhythm: Normal rate.     Pulses: Normal pulses.  Pulmonary:     Effort: Pulmonary effort is normal.  Abdominal:     General: Abdomen is flat. There is no distension.  Neurological:     General: No focal deficit present.     Mental Status: She is alert and oriented to person, place, and time.  Psychiatric:        Mood and Affect: Mood normal.        Behavior: Behavior normal.        Assessment & Plan:     ICD-10-CM   1. S/P bilateral breast reduction  Z98.890     I would like to see the patient in January and we can discuss revision and any further surgery at that time.  Pictures were obtained of the patient and placed in the chart with the patient's or guardian's permission.

## 2019-11-18 NOTE — Telephone Encounter (Signed)
Patient just saw Dr. Ulice Bold today and would like for her to call please her about her breasts.  She said that her breasts are still aching and she has a question about her annual breast exam.

## 2019-11-19 NOTE — Telephone Encounter (Addendum)
Dr. Ulice Bold will call patient back.   Returned patients call. She is still having sharp BL breast pain as well as inquiring  when can have a MMG. She already has an appointment made in November. Under the advise of Johanna, PA-C, she can have the sharp pain in her breast several months to a year.  Laurie [name] will need to wait until at least 6 months from day of her surgery. Patient understood and agreed.

## 2019-11-29 IMAGING — MG DIGITAL SCREENING BILATERAL MAMMOGRAM WITH TOMO AND CAD
8 series · 8 of 24 positions shown · non-contrast
Comparison: Previous exam(s).

CLINICAL DATA: Screening.

EXAM:
DIGITAL SCREENING BILATERAL MAMMOGRAM WITH TOMO AND CAD

[L CC synth-2D]
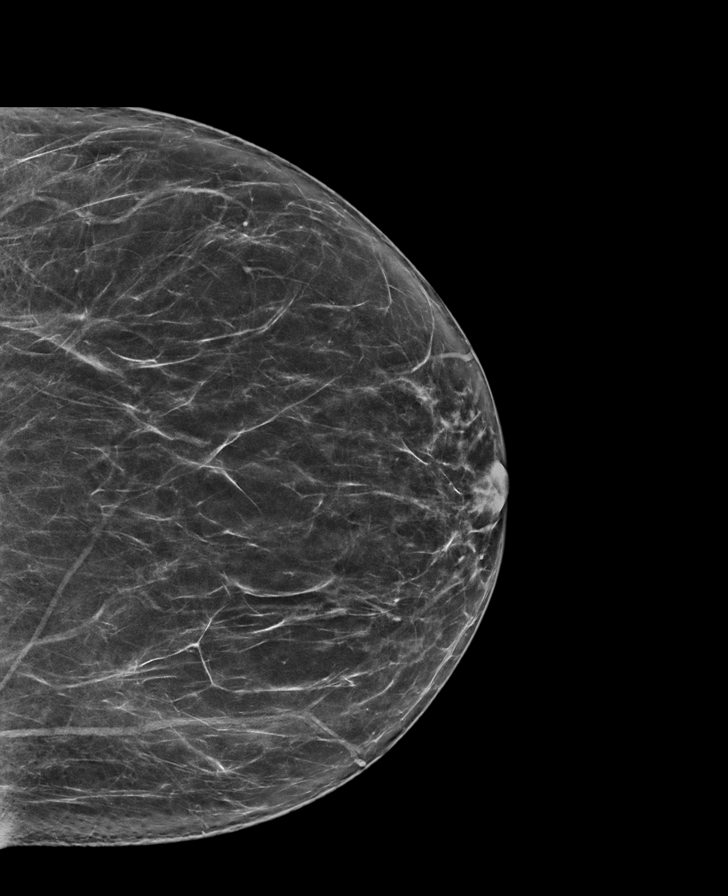

[R MLO synth-2D]
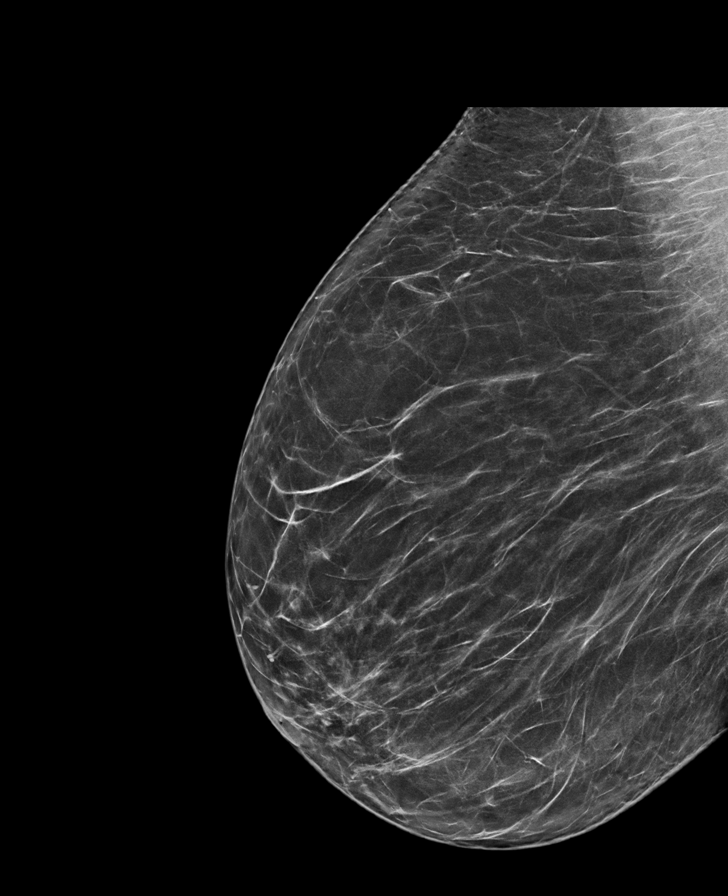

[L MLO synth-2D]
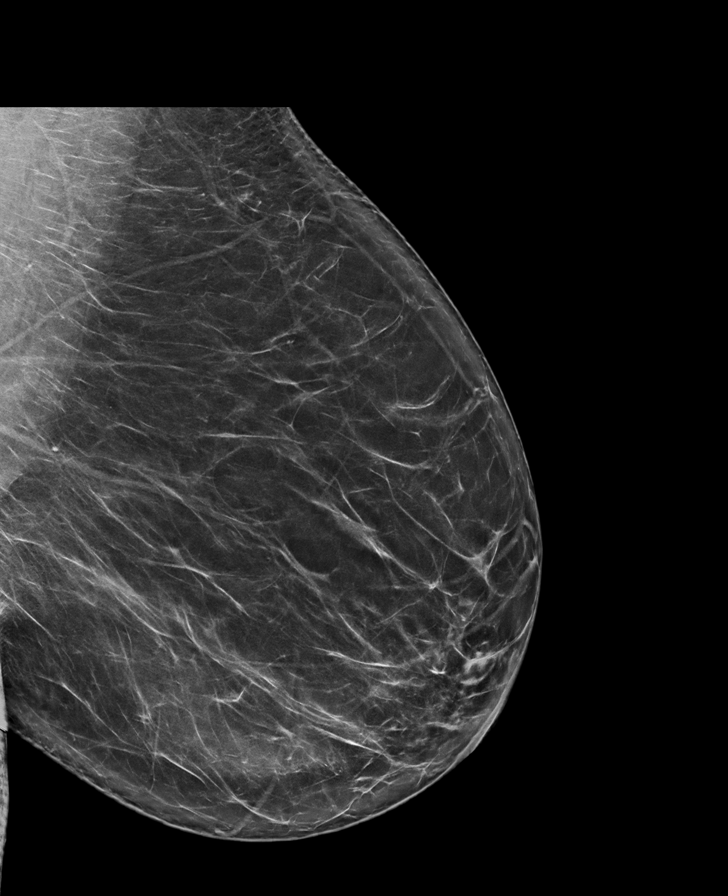

[R CC synth-2D]
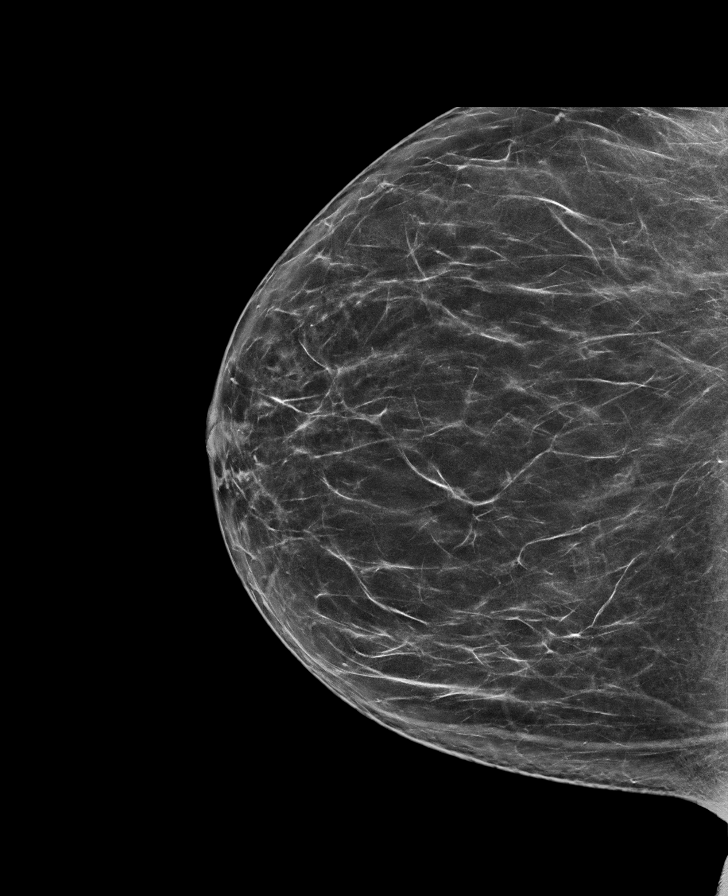

[L CC tomo · tomo slice 39/77.0]
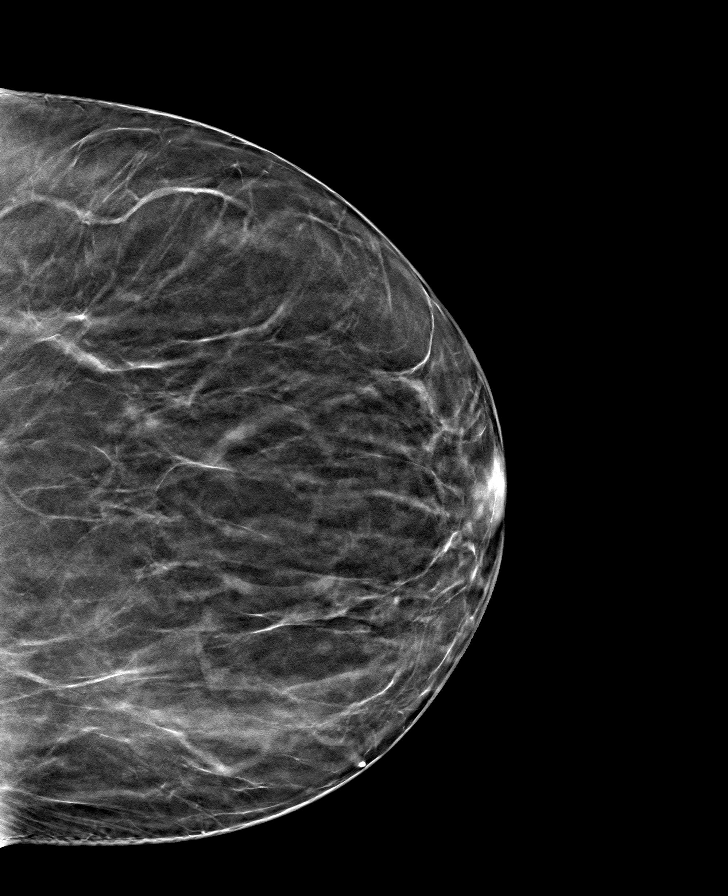

[R MLO tomo · tomo slice 41/81.0]
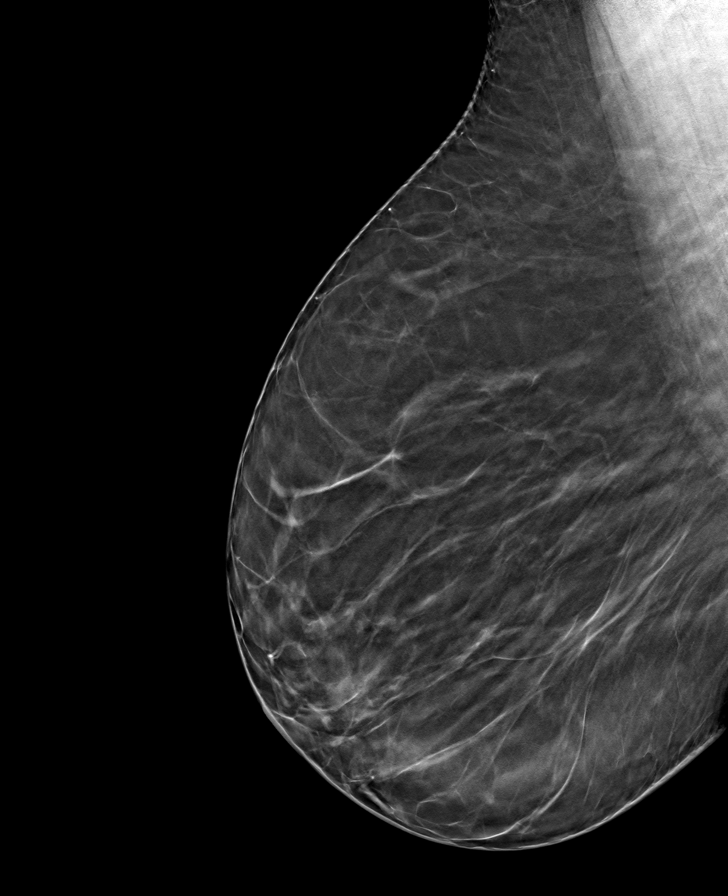

[L MLO tomo · tomo slice 44/87.0]
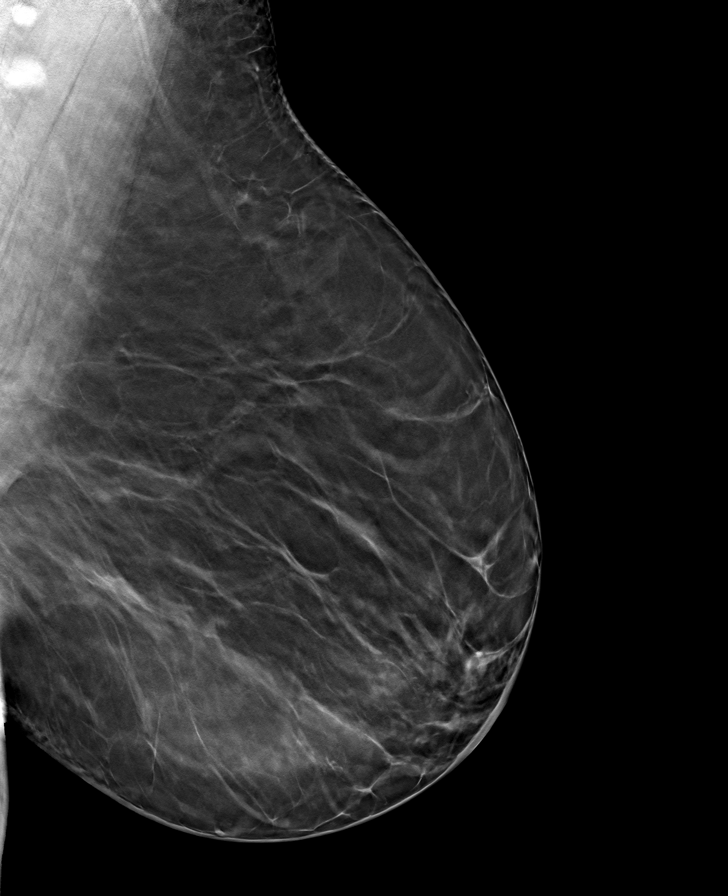

[R CC tomo · tomo slice 39/78.0]
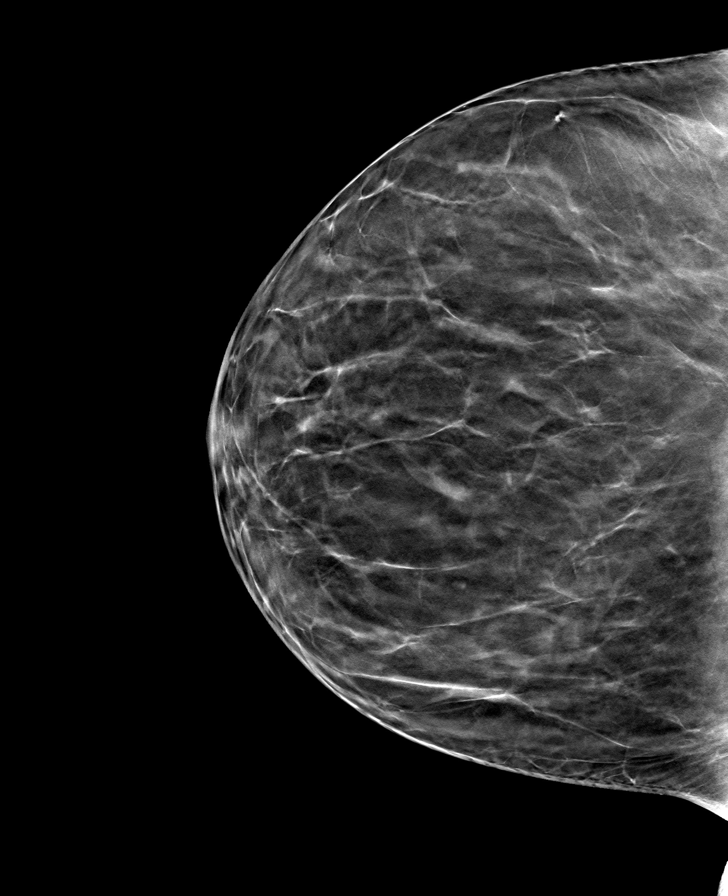

[8 of 24 positions shown; findings below may reference images not displayed]

ACR Breast Density Category b: There are scattered areas of
fibroglandular density.
FINDINGS: There are no findings suspicious for malignancy. Images were
processed with CAD.
IMPRESSION: No mammographic evidence of malignancy. A result letter of this
screening mammogram will be mailed directly to the patient.

RECOMMENDATION:
Screening mammogram in one year. (Code:[id number])

BI-RADS CATEGORY  1: Negative.

## 2019-12-01 ENCOUNTER — Telehealth: Payer: Self-pay

## 2019-12-01 NOTE — Telephone Encounter (Signed)
error 

## 2019-12-08 ENCOUNTER — Ambulatory Visit: Payer: BC Managed Care – PPO

## 2019-12-15 DIAGNOSIS — K5904 Chronic idiopathic constipation: Secondary | ICD-10-CM | POA: Diagnosis not present

## 2019-12-15 DIAGNOSIS — R7303 Prediabetes: Secondary | ICD-10-CM | POA: Diagnosis not present

## 2019-12-15 DIAGNOSIS — Z6832 Body mass index (BMI) 32.0-32.9, adult: Secondary | ICD-10-CM | POA: Diagnosis not present

## 2019-12-15 DIAGNOSIS — E669 Obesity, unspecified: Secondary | ICD-10-CM | POA: Diagnosis not present

## 2019-12-15 DIAGNOSIS — M609 Myositis, unspecified: Secondary | ICD-10-CM | POA: Diagnosis not present

## 2020-01-26 ENCOUNTER — Ambulatory Visit: Payer: BC Managed Care – PPO

## 2020-02-03 ENCOUNTER — Other Ambulatory Visit: Payer: Self-pay

## 2020-02-03 ENCOUNTER — Ambulatory Visit (INDEPENDENT_AMBULATORY_CARE_PROVIDER_SITE_OTHER): Payer: BC Managed Care – PPO | Admitting: Plastic Surgery

## 2020-02-03 ENCOUNTER — Encounter: Payer: Self-pay | Admitting: Plastic Surgery

## 2020-02-03 VITALS — BP 118/76 | HR 66 | Temp 98.2°F

## 2020-02-03 DIAGNOSIS — Z9889 Other specified postprocedural states: Secondary | ICD-10-CM

## 2020-02-03 NOTE — Progress Notes (Signed)
   Subjective:    Patient ID: Laurie [name], female    DOB: [birth year], 55 y.o.   MRN: [id number]  The patient is a 55 year old female here for follow-up on her breast reduction.  She thinks that there is excess tissue on the lateral aspect and would like to have that removed.  She is calling them dogears.  The right is worse than the left.  The sensation has not come fully back in her breasts.  She is also interested in a butt lift with an abdominoplasty and liposuction of her abdomen.  I have expressed reservations about the but left as far as me doing it.  She is willing to see Dr. Arita Miss.  She has lost 30 pounds in the last 4 months.  She has some hypertrophic scarring.  She is overall pleased with her reduction.   Review of Systems  Constitutional: Negative.   HENT: Negative.   Eyes: Negative.   Respiratory: Negative.   Cardiovascular: Negative.   Gastrointestinal: Negative.   Genitourinary: Negative.   Musculoskeletal: Negative.        Objective:   Physical Exam Vitals and nursing note reviewed.  Constitutional:      Appearance: Normal appearance.  HENT:     Head: Normocephalic and atraumatic.  Cardiovascular:     Rate and Rhythm: Normal rate.     Pulses: Normal pulses.  Pulmonary:     Effort: Pulmonary effort is normal.  Neurological:     General: No focal deficit present.     Mental Status: She is alert.  Psychiatric:        Mood and Affect: Mood normal.        Behavior: Behavior normal.         Assessment & Plan:     ICD-10-CM   1. S/P bilateral breast reduction  Z98.890     Referral made to Dr. Arita Miss.  I am happy to do the procedure with him for revision of her breasts.  I have sent him a note and will wait to hear back from him.  I am very concerned about patient trying to do this out of the country or out of state.  Pictures were obtained of the patient and placed in the chart with the patient's or guardian's permission.

## 2020-02-04 ENCOUNTER — Encounter: Payer: Self-pay | Admitting: Nurse Practitioner

## 2020-02-04 DIAGNOSIS — Z01818 Encounter for other preprocedural examination: Secondary | ICD-10-CM | POA: Diagnosis not present

## 2020-02-04 DIAGNOSIS — Z6832 Body mass index (BMI) 32.0-32.9, adult: Secondary | ICD-10-CM | POA: Diagnosis not present

## 2020-02-04 DIAGNOSIS — M609 Myositis, unspecified: Secondary | ICD-10-CM | POA: Diagnosis not present

## 2020-02-04 DIAGNOSIS — E669 Obesity, unspecified: Secondary | ICD-10-CM | POA: Diagnosis not present

## 2020-02-04 DIAGNOSIS — Z13220 Encounter for screening for lipoid disorders: Secondary | ICD-10-CM | POA: Diagnosis not present

## 2020-02-04 DIAGNOSIS — E559 Vitamin D deficiency, unspecified: Secondary | ICD-10-CM | POA: Diagnosis not present

## 2020-02-04 DIAGNOSIS — R7303 Prediabetes: Secondary | ICD-10-CM | POA: Diagnosis not present

## 2020-02-04 DIAGNOSIS — Z0000 Encounter for general adult medical examination without abnormal findings: Secondary | ICD-10-CM | POA: Diagnosis not present

## 2020-02-05 LAB — COMPREHENSIVE METABOLIC PANEL
Albumin: 3.9 (ref 3.5–5.0)
Calcium: 9.1 (ref 8.7–10.7)
GFR calc Af Amer: 80
GFR calc non Af Amer: 69
Globulin: 3.3

## 2020-02-05 LAB — BASIC METABOLIC PANEL
BUN: 19 (ref 4–21)
CO2: 25 — AB (ref 13–22)
Chloride: 104 (ref 99–108)
Creatinine: 0.9 (ref 0.5–1.1)
Glucose: 81
Potassium: 4.2 (ref 3.4–5.3)
Sodium: 140 (ref 137–147)

## 2020-02-05 LAB — LIPID PANEL
Cholesterol: 187 (ref 0–200)
HDL: 44 (ref 35–70)
LDL Cholesterol: 121
Triglycerides: 110 (ref 40–160)

## 2020-02-05 LAB — CBC AND DIFFERENTIAL
HCT: 40 (ref 36–46)
Hemoglobin: 12.8 (ref 12.0–16.0)
Platelets: 277 (ref 150–399)
WBC: 7.4

## 2020-02-05 LAB — HEPATIC FUNCTION PANEL
ALT: 14 (ref 7–35)
AST: 15 (ref 13–35)
Alkaline Phosphatase: 84 (ref 25–125)
Bilirubin, Total: 0.6

## 2020-02-05 LAB — POCT INR: INR: 1 (ref 0.9–1.1)

## 2020-02-05 LAB — VITAMIN D 25 HYDROXY (VIT D DEFICIENCY, FRACTURES): Vit D, 25-Hydroxy: 30

## 2020-02-05 LAB — CBC: RBC: 5.02 (ref 3.87–5.11)

## 2020-02-05 LAB — TSH: TSH: 1.17 (ref 0.41–5.90)

## 2020-02-23 ENCOUNTER — Encounter: Payer: Self-pay | Admitting: Gastroenterology

## 2020-02-24 ENCOUNTER — Ambulatory Visit (INDEPENDENT_AMBULATORY_CARE_PROVIDER_SITE_OTHER): Payer: BC Managed Care – PPO | Admitting: Nurse Practitioner

## 2020-02-24 ENCOUNTER — Ambulatory Visit: Payer: BC Managed Care – PPO | Admitting: Nurse Practitioner

## 2020-02-24 ENCOUNTER — Encounter: Payer: Self-pay | Admitting: Nurse Practitioner

## 2020-02-24 VITALS — BP 134/90 | HR 100 | Ht 65.5 in | Wt 214.5 lb

## 2020-02-24 DIAGNOSIS — K5909 Other constipation: Secondary | ICD-10-CM

## 2020-02-24 DIAGNOSIS — Z1211 Encounter for screening for malignant neoplasm of colon: Secondary | ICD-10-CM | POA: Diagnosis not present

## 2020-02-24 MED ORDER — SUPREP BOWEL PREP KIT 17.5-3.13-1.6 GM/177ML PO SOLN: 1.0000 | ORAL | 0 refills | Status: DC

## 2020-02-24 NOTE — Patient Instructions (Addendum)
If you are age 54 or older, your body mass index should be between 23-30. Your Body mass index is 35.15 kg/m. If this is out of the aforementioned range listed, please consider follow up with your Primary Care Provider.  If you are age 40 or younger, your body mass index should be between 19-25. Your Body mass index is 35.15 kg/m. If this is out of the aformentioned range listed, please consider follow up with your Primary Care Provider.   You have been scheduled for a colonoscopy. Please follow written instructions given to you at your visit today.  Please pick up your prep supplies at the pharmacy within the next 1-3 days. If you use inhalers (even only as needed), please bring them with you on the day of your procedure.  STOP DIET PILL 10 DAYS BEFORE PROCEDURE.   OVER THE COUNTER MEDICATION  Please purchase the following medications over the counter and take as directed:       Miralax. Dissolve one capful in 8 ounces of water and drink before bed.    It was great seeing you today!  Thank you for entrusting me with your care and choosing [facility].  Arnaldo Natal, CRNP

## 2020-02-24 NOTE — Progress Notes (Signed)
02/24/2020 Kanisha Duba [facility] [id number] [birth year]   CHIEF COMPLAINT: Chronic constipation, schedule colonoscopy  HISTORY OF PRESENT ILLNESS: Celestia Duva. Hickman is a 55 year old female with a past medical history of fibromyalgia, osteoarthritis, hypertension, sinus tachycardia, anemia, gout and chronic constipation.  Past right and left hip replacement surgery and breast reduction surgery. She presents to our office today as referred by Dr. Fleet Contras for further evaluation regarding chronic constipation and to schedule a colonoscopy.  She has chronic constipation for at least the past 5 years.  She takes a fiber supplement daily and 4 Senokot tablets daily for the past 5 years she typically results in passing 1 or 2 soft formed brown bowel movements in the morning.  She infrequently takes MiraLAX, possibly once weekly.  She takes 1/2-1 (10 oz) bottle of magnesium citrate 3-4 times monthly if she does not pass a bowel movement in 1 or 2 days.  She used Amitiza in the past which worked for a while then was ineffective.  No upper or lower abdominal pain.  No rectal bleeding or melena.  She intermittently undergoes colonic therapy for the past 10 years, sometimes twice yearly.  She last underwent a colonic approximately 2 months ago.  She has heartburn if she eats tomato products.  She denies ever having a screening colonoscopy.  No family history of colorectal cancer.  She reported having laboratory studies done within the past few weeks with her PCP, I will request a copy of these results for further review.  She is attempting to lose weight on Phentermine.  Past Medical History:  Diagnosis Date  . Acne vulgaris 01/19/15  . Allergic rhinitis   . Anemia   . Borderline diabetes 11/14/11  . Constipation   . Eczematous dermatitis 11/08/2010   eczema  . Endometriosis   . Esophageal reflux   . Fibromyalgia   . Gout 02/08/12   idopathic gout, left ankle and foot  . Gynecomastia 11/18/13    hypertrophy of breast  . Hypertension   . Obesity, unspecified 07/04/05  . Osteoarthritis    Right hip and knee  . Paresthesias/numbness 11/14/11  . Pre-diabetes   . Premature menopause 07/09/13  . Sinus tachycardia   . 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   Past Surgical History:  Procedure Laterality Date  . BREAST REDUCTION SURGERY  08/20/2019   Procedure: MAMMARY REDUCTION  (BREAST);  Surgeon: Peggye Form, DO;  Location: [facility];  Service: Plastics;;  . Cardiac Event Monitor  03/2015   [facility]: Sinus rhythm with occasional sinus tachycardia. Rates as high as 122. No arrhythmias seen.  Marland Kitchen JOINT REPLACEMENT Right 05/23/2006  . LIPOSUCTION  2014  . TOTAL HIP ARTHROPLASTY Right 05/23/2006  . TOTAL HIP ARTHROPLASTY Left 04/02/2018   Procedure: TOTAL HIP ARTHROPLASTY ANTERIOR APPROACH;  Surgeon: Durene Romans, MD;  Location: [facility];  Service: Orthopedics;  Laterality: Left;  . TRANSTHORACIC ECHOCARDIOGRAM  03/02/2015   [facility], Dr. Sherril Croon: Normal LV size, mild-moderate concentric LVH with mild diffuse HK. EF~50%. Mild LA dilation. Could not exclude small ASD/PFO due to atrial septal dropout. Mild TR, mild MR.  Marland Kitchen TREADMILL STRESS ECHOCARDIOGRAM  07/2015   Exercised for 7 minutes-8.5 METS.  Terminated due to fatigue and reaching target heart rate -171 bpm which is under percent of expected).  No chest pain.  EKG negative for ischemia.  EF normal at baseline (55%)  improved to 65% with exertion..  Baseline echo did not show evidence of LVH or R WMA.   Social History: She is separated.  She has 1 son.  She is a non-smoker.  No alcohol use.  No drug use.  Family History: Mother died from leukemia age 39. Father age 71, health history uknown. Brother age 83 healthy.  Maternal aunt with history of breast cancer.  Allergies  Allergen Reactions   . Sulfa Antibiotics Swelling  . Sulfasalazine Swelling      Outpatient Encounter Medications as of 02/24/2020  Medication Sig  . acetaminophen (TYLENOL) 500 MG tablet Take 2 tablets (1,000 mg total) by mouth every 8 (eight) hours.  Marland Kitchen allopurinol (ZYLOPRIM) 100 MG tablet Take 100 mg by mouth daily.  . Biotin 5 MG CAPS Take 5 mg by mouth daily.   . calcium-vitamin D (OSCAL WITH D) 500-200 MG-UNIT tablet Take 1 tablet by mouth daily with breakfast.   . fluticasone (FLONASE) 50 MCG/ACT nasal spray Place 2 sprays into both nostrils daily as needed for allergies.   . folic acid (FOLVITE) 1 MG tablet Take 1 mg by mouth daily.   Marland Kitchen gabapentin (NEURONTIN) 300 MG capsule Take 300 mg by mouth 2 (two) times daily.  Marland Kitchen lubiprostone (AMITIZA) 24 MCG capsule Take 24 mcg by mouth daily as needed for constipation.   . metoprolol succinate (TOPROL-XL) 25 MG 24 hr tablet Take 1 tablet (25 mg total) by mouth daily.  . Multiple Vitamins-Minerals (MULTIVITAMINS THER. W/MINERALS) TABS Take 1 tablet by mouth daily.    Marland Kitchen senna (SENOKOT) 8.6 MG tablet Take 3 tablets by mouth daily.  . Wheat Dextrin (BENEFIBER DRINK MIX PO) Take 1 Scoop by mouth daily.   No facility-administered encounter medications on file as of 02/24/2020.     REVIEW OF SYSTEMS: Gen: Denies fever, sweats or chills. No weight loss.  CV: Denies chest pain, palpitations or edema. Resp: Denies cough, shortness of breath of hemoptysis.  GI: See HPI. GU : Denies urinary burning, blood in urine, increased urinary frequency or incontinence. MS: + arthritis, back pain. Derm: Denies rash, itchiness, skin lesions or unhealing ulcers. Psych: + Anxiety.  Heme: Denies bruising, bleeding. Neuro:  Denies headaches, dizziness or paresthesias. Endo:  Denies any problems with DM, thyroid or adrenal function.  PHYSICAL EXAM: Ht 5' 5.5" (1.664 m) Comment: height measured without shoes  Wt 214 lb 8 oz (97.3 kg)   LMP 07/14/2013 Comment: no periods >20yrs   BMI 35.15 kg/m   Wt Readings from Last 3 Encounters:  02/24/20 214 lb 8 oz (97.3 kg)  08/20/19 227 lb 1.2 oz (103 kg)  06/23/19 224 lb 6.4 oz (101.8 kg)   General: Well developed 55 year old female in no acute distress. Head: Normocephalic and atraumatic. Eyes:  Sclerae non-icteric, conjunctive pink. Ears: Normal auditory acuity. Mouth: Upper and lower dentures.  No ulcers or lesions.  Neck: Supple, no lymphadenopathy or thyromegaly.  Lungs: Clear bilaterally to auscultation without wheezes, crackles or rhonchi. Heart: Regular rate and rhythm. No murmur, rub or gallop appreciated.  Abdomen: Soft, nontender, non distended. No masses. No hepatosplenomegaly. Normoactive bowel sounds x 4 quadrants.  Rectal: Deferred. Musculoskeletal: Symmetrical with no gross deformities. Skin: Warm and dry. No rash or lesions on visible extremities. Extremities: No edema. Neurological: Alert oriented x 4, no focal deficits.  Psychological:  Alert and cooperative. Normal mood and affect.  ASSESSMENT AND PLAN:  51.  55 year old female with chronic constipation -MiraLAX nightly.  Continue Senokot 4 times daily  for now.  Try to reduce magnesium citrate usage.  -Discussed trial of Linzess after colonoscopy completed -Request copy of recent CBC, CMP from PCPs office  2.  Colon cancer screening -Colonoscopy benefits and risks discussed including risk with sedation, risk of bleeding, perforation and infection  -Patient instructed to hold phentermine for 10 days prior to her colonoscopy date  Further follow-up to be determined after the above evaluation completed        CC:  Fleet Contras, MD

## 2020-02-25 NOTE — Progress Notes (Signed)
Addendum: Reviewed and agree with assessment and management plan. Gale Hulse M, MD  

## 2020-03-04 ENCOUNTER — Other Ambulatory Visit: Payer: Self-pay

## 2020-03-04 ENCOUNTER — Encounter: Payer: Self-pay | Admitting: Plastic Surgery

## 2020-03-04 ENCOUNTER — Ambulatory Visit (INDEPENDENT_AMBULATORY_CARE_PROVIDER_SITE_OTHER): Payer: Self-pay | Admitting: Plastic Surgery

## 2020-03-04 VITALS — BP 134/92 | HR 93 | Temp 97.1°F | Ht 67.0 in | Wt 205.0 lb

## 2020-03-04 DIAGNOSIS — Z411 Encounter for cosmetic surgery: Secondary | ICD-10-CM

## 2020-03-04 NOTE — Progress Notes (Signed)
Referring Provider Nolene Ebbs, MD [address] [place],  [place] [address]   CC: No chief complaint on file.     Laurie [name] is an 55 y.o. female.  HPI: Patient presents to discuss a number of cosmetic issues.  She is bothered by her abdomen, thighs, buttock, hips and upper back.  She is interested in cosmetic surgery for all these areas.  For the abdomen she is interested in abdominal contouring and liposuction.  The thighs she would like it improved appearance in the upper thigh specifically medially.  For the buttock she would like it to be more prominent.  She has had a hip replacement on the right side and is interested in filling out the depression that is formed by the scar in that area.  She would like her lower back to be slimmer to give a more dramatic transition between her lower back and her buttock.  She is also bothered by the rolls that extend from the lateral aspect of her axilla up along her upper back.  She would also like some dogears to be excised in the lateral incisions for breast reduction.  Allergies  Allergen Reactions  . Sulfa Antibiotics Swelling  . Sulfasalazine Swelling    Outpatient Encounter Medications as of 03/04/2020  Medication Sig  . acetaminophen (TYLENOL) 500 MG tablet Take 2 tablets (1,000 mg total) by mouth every 8 (eight) hours. (Patient taking differently: Take 1,000 mg by mouth as needed.)  . allopurinol (ZYLOPRIM) 100 MG tablet Take 100 mg by mouth daily.  . Biotin 5 MG CAPS Take 5 mg by mouth daily.   . calcium-vitamin D (OSCAL WITH D) 500-200 MG-UNIT tablet Take 1 tablet by mouth daily with breakfast.   . DULoxetine (CYMBALTA) 30 MG capsule Take 30 mg by mouth every morning.  . fluticasone (FLONASE) 50 MCG/ACT nasal spray Place 2 sprays into both nostrils daily as needed for allergies.   . folic acid (FOLVITE) 1 MG tablet Take 1 mg by mouth daily.   Marland Kitchen gabapentin (NEURONTIN) 300 MG capsule Take 300 mg by mouth 2 (two) times  daily.  Marland Kitchen lubiprostone (AMITIZA) 24 MCG capsule Take 24 mcg by mouth daily as needed for constipation.   . metoprolol succinate (TOPROL-XL) 25 MG 24 hr tablet Take 1 tablet (25 mg total) by mouth daily.  . Multiple Vitamins-Minerals (MULTIVITAMINS THER. W/MINERALS) TABS Take 1 tablet by mouth daily.    . phentermine (ADIPEX-P) 37.5 MG tablet Take 37.5 mg by mouth every morning.  . senna (SENOKOT) 8.6 MG tablet Take 3 tablets by mouth daily.  Manus Gunning BOWEL PREP KIT 17.5-3.13-1.6 GM/177ML SOLN Take 1 kit by mouth as directed. For colonoscopy prep  . Wheat Dextrin (BENEFIBER DRINK MIX PO) Take 1 Scoop by mouth daily.   No facility-administered encounter medications on file as of 03/04/2020.     Past Medical History:  Diagnosis Date  . Acne vulgaris 01/19/15  . Allergic rhinitis   . Anemia   . Anxiety   . Borderline diabetes 11/14/11  . Constipation   . Eczematous dermatitis 11/08/2010   eczema  . Endometriosis   . Esophageal reflux   . Fibromyalgia   . Gout 02/08/12   idopathic gout, left ankle and foot  . Gynecomastia 11/18/13   hypertrophy of breast  . Hypertension   . Obesity, unspecified 07/04/05  . Osteoarthritis    Right hip and knee  . Paresthesias/numbness 11/14/11  . Pre-diabetes   . Premature menopause 07/09/13  .  Sinus tachycardia   . Varicose veins of bilateral lower extremities with other complications 16/10/9602   Spider veins of both lower extremities with edema.    Past Surgical History:  Procedure Laterality Date  . BREAST REDUCTION SURGERY  08/20/2019   Procedure: MAMMARY REDUCTION  (BREAST);  Surgeon: Wallace Going, DO;  Location: [facility];  Service: Plastics;;  . Cardiac Event Monitor  03/2015   [facility]: Sinus rhythm with occasional sinus tachycardia. Rates as high as 122. No arrhythmias seen.  Marland Kitchen LIPOSUCTION  2014  . TOTAL HIP ARTHROPLASTY Right 05/23/2006  . TOTAL HIP ARTHROPLASTY Left 04/02/2018   Procedure:  TOTAL HIP ARTHROPLASTY ANTERIOR APPROACH;  Surgeon: Paralee Cancel, MD;  Location: [facility];  Service: Orthopedics;  Laterality: Left;  . TRANSTHORACIC ECHOCARDIOGRAM  03/02/2015   [facility], Dr. Woody Seller: Normal LV size, mild-moderate concentric LVH with mild diffuse HK. EF~50%. Mild LA dilation. Could not exclude small ASD/PFO due to atrial septal dropout. Mild TR, mild MR.  Marland Kitchen TREADMILL STRESS ECHOCARDIOGRAM  07/2015   Exercised for 7 minutes-8.5 METS.  Terminated due to fatigue and reaching target heart rate -171 bpm which is under percent of expected).  No chest pain.  EKG negative for ischemia.  EF normal at baseline (55%) improved to 65% with exertion..  Baseline echo did not show evidence of LVH or R WMA.    Family History  Problem Relation Age of Onset  . Healthy Father   . Leukemia Mother   . Heart failure Maternal Aunt   . Breast cancer Maternal Aunt     Social History   Social History Narrative   Married now for 3 years. Mother of one.   This with her spouse. Is a Statistician for [employer]   Does not drink does not smoke and does not use illicit drugs. Does not exercise.     Review of Systems General: Denies fevers, chills, weight loss CV: Denies chest pain, shortness of breath, palpitations  Physical Exam Vitals with BMI 03/04/2020 02/24/2020 02/03/2020  Height 5' 7"  5' 5.5" -  Weight 205 lbs 214 lbs 8 oz -  BMI 54.0 98.11 -  Systolic 914 782 956  Diastolic 92 90 76  Pulse 93 100 66    General:  No acute distress,  Alert and oriented, Non-Toxic, Normal speech and affect Abdomen: Abdomen is soft nontender.  I do not appreciate any hernias.  There is no obvious scars.  She has moderate excess skin and adipose tissue.  She has mild dogears on the lateral aspect of her breast incisions bilaterally. Thighs: She has moderate excess adipose tissue and mild excess skin in the medial aspect of her thighs.  No obvious scars.  She has a depression in the lateral  aspect of her right hip over the trochanteric area where her hip replacement surgery was done.  On the left side she has an anterior approach for a left hip replacement with no contour irregularities.  Extending around posteriorly there is not much excess skin in the buttock and lower back.  She does have a less abrupt transition between her lower back and buttock and she would like.  She has excess skin and adipose tissue extending along the lateral aspect of the bilateral upper back.  No obvious scars on the back.  Assessment/Plan Patient is reasonable candidate for cosmetic surgery number of areas.  We discussed doing the surgeries on the anterior aspect of her body first followed by posteriorly.  Her abdomen is a big priority for her and I think she is a good candidate for abdominoplasty with liposuction in the upper abdomen, flanks and suprapubic area.  I do a rectus plication.  At the same time I could do liposuction of the bilateral medial thighs combined with the skin excision superiorly that would be kept in the transverse dimension and hidden in the groin crease.  This would take out some of the superior skin redundancy.  I think this could all be done in a safe manner and 1 time.  We discussed the risks include bleeding, infection, damage to surrounding structures need for additional procedures.  We discussed the location and orientation of the scars in detail.  We discussed the potential for persistent contour irregularities.  She seemed understanding of all these risks and is interested in moving forward  Regarding the back I think should be a reasonable candidate for fairly aggressive back liposuction with transition of some of this fat to the buttock and right lateral hip area to improve symmetry.  I explained some of the risks with this procedure but we can go over that more in detail at future visits.  Also for the back I think it if she wanted that area to be addressed in the upper back that  she would require liposuction with some skin excision in that area as well otherwise there would be too much redundancy.  She seemed interested in that plan as a second stage operation for posterior procedures.  Cindra Presume 03/04/2020, 2:47 PM

## 2020-03-15 DIAGNOSIS — Z6832 Body mass index (BMI) 32.0-32.9, adult: Secondary | ICD-10-CM | POA: Diagnosis not present

## 2020-03-15 DIAGNOSIS — E669 Obesity, unspecified: Secondary | ICD-10-CM | POA: Diagnosis not present

## 2020-03-15 DIAGNOSIS — R7303 Prediabetes: Secondary | ICD-10-CM | POA: Diagnosis not present

## 2020-03-15 DIAGNOSIS — M169 Osteoarthritis of hip, unspecified: Secondary | ICD-10-CM | POA: Diagnosis not present

## 2020-03-15 DIAGNOSIS — K5904 Chronic idiopathic constipation: Secondary | ICD-10-CM | POA: Diagnosis not present

## 2020-03-17 ENCOUNTER — Telehealth (INDEPENDENT_AMBULATORY_CARE_PROVIDER_SITE_OTHER): Payer: BC Managed Care – PPO | Admitting: Surgical

## 2020-03-17 ENCOUNTER — Other Ambulatory Visit: Payer: Self-pay

## 2020-03-17 ENCOUNTER — Institutional Professional Consult (permissible substitution): Payer: BC Managed Care – PPO | Admitting: Plastic Surgery

## 2020-03-17 DIAGNOSIS — Z411 Encounter for cosmetic surgery: Secondary | ICD-10-CM

## 2020-03-17 NOTE — Progress Notes (Signed)
Patient is a 55 year old female scheduled for abdominoplasty with liposuction, bilateral medial thigh lift with liposuction, excision of dogears of bilateral breast, liposuction of excess axillary breast tissue with Dr. Arita Miss.  She presents for virtual telephone visit to discuss questions related to her surgery.   She is interested in liposuction of her chin, we discussed today that she would need to be reevaluated in the office with Dr. Arita Miss to discuss this further and for him to evaluate this area of concern.  Ms. Inda Merlin then myself spent 27 minutes on the phone today discussing all of her questions related to her scheduled surgery.  She has an additional preoperative evaluation scheduled in a few weeks.  All of her questions were answered to her content.  The patient gave consent to have this visit done by telemedicine / virtual visit, two identifiers were used to identify patient. This is also consent for access the chart and treat the patient via this visit. The patient is located at home.  I, the provider, am at the office.  We spent 27 minutes together for the visit.  Joined by telephone.

## 2020-03-18 ENCOUNTER — Ambulatory Visit (INDEPENDENT_AMBULATORY_CARE_PROVIDER_SITE_OTHER): Payer: BC Managed Care – PPO | Admitting: Plastic Surgery

## 2020-03-18 ENCOUNTER — Encounter: Payer: Self-pay | Admitting: Surgical

## 2020-03-18 ENCOUNTER — Other Ambulatory Visit: Payer: Self-pay

## 2020-03-18 ENCOUNTER — Encounter: Payer: Self-pay | Admitting: Plastic Surgery

## 2020-03-18 VITALS — BP 144/83 | HR 109

## 2020-03-18 DIAGNOSIS — Z411 Encounter for cosmetic surgery: Secondary | ICD-10-CM

## 2020-03-18 NOTE — Progress Notes (Signed)
Patient presents after further discussions about her cosmetic procedures.  At her last visit we discussed abdominoplasty and medial thigh lift.  She wants to add liposuction in the submental area in addition to her axillary areas.  We did plan on excising what her very mild dogears from her previous breast reduction operation which we can extend to excise some of the axillary tissue as well.  On exam she has good skin quality beneath her chin and a bit of excess fat in the submental area.  I think she is a reasonably good candidate for liposuction to address that.  Regarding her axillary areas I think liposuction and direct tissue excision would give her a nice result and improve for very mild dogears that she has.  At this point where probably over 5 hours in terms of total surgery time 5 explained that I would not do any additional procedures as that is already a pretty long operation.  She is in agreement with this and is excited about moving forward.

## 2020-03-23 ENCOUNTER — Other Ambulatory Visit: Payer: Self-pay

## 2020-03-23 ENCOUNTER — Ambulatory Visit (AMBULATORY_SURGERY_CENTER): Payer: BC Managed Care – PPO | Admitting: Internal Medicine

## 2020-03-23 ENCOUNTER — Encounter: Payer: Self-pay | Admitting: Internal Medicine

## 2020-03-23 VITALS — BP 130/87 | HR 96 | Temp 97.8°F | Resp 20 | Ht 65.0 in | Wt 214.0 lb

## 2020-03-23 DIAGNOSIS — Z1211 Encounter for screening for malignant neoplasm of colon: Secondary | ICD-10-CM

## 2020-03-23 MED ORDER — SODIUM CHLORIDE 0.9 % IV SOLN: 500.0000 mL | INTRAVENOUS | Status: DC

## 2020-03-23 NOTE — Progress Notes (Signed)
Vs by CW in adm 

## 2020-03-23 NOTE — Progress Notes (Signed)
To PACU, VSS. Report to [email or web address] 

## 2020-03-23 NOTE — Op Note (Signed)
[facility] Patient Name: Laurie BelfastRhonda Jasinski Procedure Date: 03/23/2020 3:44 PM MRN: [id number] Endoscopist: Beverley FiedlerJay M Simonne Boulos , MD Age: 55 Referring MD:  Date of Birth: 04/06/1965 Gender: Female Account #: [id number] Procedure:                Colonoscopy Indications:              Screening for colorectal malignant neoplasm Medicines:                Monitored Anesthesia Care Procedure:                Pre-Anesthesia Assessment:                           - Prior to the procedure, a History and Physical                            was performed, and patient medications and                            allergies were reviewed. The patient's tolerance of                            previous anesthesia was also reviewed. The risks                            and benefits of the procedure and the sedation                            options and risks were discussed with the patient.                            All questions were answered, and informed consent                            was obtained. Prior Anticoagulants: The patient has                            taken no previous anticoagulant or antiplatelet                            agents. ASA Grade Assessment: II - A patient with                            mild systemic disease. After reviewing the risks                            and benefits, the patient was deemed in                            satisfactory condition to undergo the procedure.                           After obtaining informed consent, the colonoscope  was passed under direct vision. Throughout the                            procedure, the patient's blood pressure, pulse, and                            oxygen saturations were monitored continuously. The                            Olympus PFC-[id number] (#[id number]) Colonoscope was                            introduced through the anus and advanced to the                            terminal ileum. The  colonoscopy was performed                            without difficulty. The patient tolerated the                            procedure well. The quality of the bowel                            preparation was good. The terminal ileum, ileocecal                            valve, appendiceal orifice, and rectum were                            photographed. Scope In: 4:04:54 PM Scope Out: 4:16:45 PM Scope Withdrawal Time: 0 hours 9 minutes 39 seconds  Total Procedure Duration: 0 hours 11 minutes 51 seconds  Findings:                 The digital rectal exam was normal.                           The terminal ileum appeared normal.                           An area of mild melanosis was found in the                            transverse colon, in the ascending colon and in the                            cecum.                           The colon (entire examined portion) appeared                            otherwise normal.  Internal hemorrhoids were found during                            retroflexion. The hemorrhoids were small. Complications:            No immediate complications. Estimated Blood Loss:     Estimated blood loss: none. Impression:               - The examined portion of the ileum was normal.                           - Melanosis in the colon.                           - The entire examined colon is otherwise normal.                           - Small internal hemorrhoids.                           - No specimens collected. Recommendation:           - Patient has a contact number available for                            emergencies. The signs and symptoms of potential                            delayed complications were discussed with the                            patient. Return to normal activities tomorrow.                            Written discharge instructions were provided to the                            patient.                            - Resume previous diet.                           - Continue present medications. Can continue senna                            and MiraLax as needed for constipation. If these                            medications fail to resolve constipation we can try                            prescription laxative (such as Trulance or                            Linzess). If constipation remains a problem please  contact my office.                           - Repeat colonoscopy in 10 years for screening                            purposes. Beverley Fiedler, MD 03/23/2020 4:20:15 PM This report has been signed electronically.

## 2020-03-23 NOTE — Discharge Instructions (Signed)
Resume previous medications. Handouts on findings given to patient.  YOU HAD AN ENDOSCOPIC PROCEDURE TODAY AT THE [facility]:   Refer to the procedure report that was given to you for any specific questions about what was found during the examination.  If the procedure report does not answer your questions, please call your gastroenterologist to clarify.  If you requested that your care partner not be given the details of your procedure findings, then the procedure report has been included in a sealed envelope for you to review at your convenience later.  YOU SHOULD EXPECT: Some feelings of bloating in the abdomen. Passage of more gas than usual.  Walking can help get rid of the air that was put into your GI tract during the procedure and reduce the bloating. If you had a lower endoscopy (such as a colonoscopy or flexible sigmoidoscopy) you may notice spotting of blood in your stool or on the toilet paper. If you underwent a bowel prep for your procedure, you may not have a normal bowel movement for a few days.  Please Note:  You might notice some irritation and congestion in your nose or some drainage.  This is from the oxygen used during your procedure.  There is no need for concern and it should clear up in a day or so.  SYMPTOMS TO REPORT IMMEDIATELY:   Following lower endoscopy (colonoscopy or flexible sigmoidoscopy):  Excessive amounts of blood in the stool  Significant tenderness or worsening of abdominal pains  Swelling of the abdomen that is new, acute  Fever of 100F or higher  For urgent or emergent issues, a gastroenterologist can be reached at any hour by calling [phone]. Do not use MyChart messaging for urgent concerns.    DIET:  We do recommend a small meal at first, but then you may proceed to your regular diet.  Drink plenty of fluids but you should avoid alcoholic beverages for 24 hours.  ACTIVITY:  You should plan to take it easy for the rest of today  and you should NOT DRIVE or use heavy machinery until tomorrow (because of the sedation medicines used during the test).    FOLLOW UP: Our staff will call the number listed on your records 48-72 hours following your procedure to check on you and address any questions or concerns that you may have regarding the information given to you following your procedure. If we do not reach you, we will leave a message.  We will attempt to reach you two times.  During this call, we will ask if you have developed any symptoms of COVID 19. If you develop any symptoms (ie: fever, flu-like symptoms, shortness of breath, cough etc.) before then, please call [phone].  If you test positive for Covid 19 in the 2 weeks post procedure, please call and report this information to us.    If any biopsies were taken you will be contacted by phone or by letter within the next 1-3 weeks.  Please call us at [phone] if you have not heard about the biopsies in 3 weeks.    SIGNATURES/CONFIDENTIALITY: You and/or your care partner have signed paperwork which will be entered into your electronic medical record.  These signatures attest to the fact that that the information above on your After Visit Summary has been reviewed and is understood.  Full responsibility of the confidentiality of this discharge information lies with you and/or your care-partner. 

## 2020-03-25 ENCOUNTER — Telehealth: Payer: Self-pay

## 2020-03-25 NOTE — Telephone Encounter (Signed)
LVM

## 2020-03-29 NOTE — Progress Notes (Signed)
Patient ID: Laurie [name], female    DOB: 04/29/1965, 55 y.o.   MRN: [id number]  Chief Complaint  Patient presents with  . Pre-op Exam      ICD-10-CM   1. Encounter for cosmetic procedure  Z41.1   2. S/P bilateral breast reduction  Z98.890   3. Neck pain  M54.2   4. Chronic bilateral thoracic back pain  M54.6    G89.29   5. Symptomatic mammary hypertrophy  N62     History of Present Illness: Laurie [name] is a 55 y.o.  female   She presents for preoperative evaluation for upcoming procedure, for abdominoplasty with liposuction, bilateral medial thigh lift with liposuction, excision of dogears of bilateral breasts, liposuction of excess axillary/breast tissue and liposuction of submental fat/chin, scheduled for 04/09/2020 with Dr. Arita Miss.  The patient has not had problems with anesthesia. No history of DVT/PE.  No family history of DVT/PE.  No family or personal history of bleeding or clotting disorders.  Patient is not currently taking any blood thinners.  No history of CVA/MI.   Summary of Previous Visit: Patient presents to discuss a number of cosmetic issues, she is bothered by her abdomen, thighs, buttock, hips and upper back. Patient is interested in abdominal contouring and liposuction for her abdomen. She would like the appearance of her thighs to be improved, specifically the medial upper thigh. For the buttock she would like it to be more prominent. She has had a hip replacement on the right side and is interested in filling out the depression that is formed by the scar in the area. She would like her lower back to be slimmer to give a more dramatic transition between her lower back and her buttock. She is also bothered by the rolls that extends from the lateral aspect of her axilla up along her upper back. She also would like some dogears to be excised in the lateral incisions of her bilateral breast reduction.  Job: N/A, disability  PMH Significant for: Anemia,  anxiety, prediabetes -most recent A1c available 5.8 years ago, fibromyalgia  Patient reports she is doing well today, she reports no recent infectious symptoms, no recent fevers, chills, nausea, vomiting, dizziness, weakness, chest pain, shortness of breath.  Patient reports she went to her PCP for preoperative clearance and reports she was cleared by her PCP.  She reports the results and information was faxed to our office  Past Medical History: Allergies: Allergies  Allergen Reactions  . Sulfa Antibiotics Swelling  . Sulfasalazine Swelling    Current Medications:  Current Outpatient Medications:  .  acetaminophen (TYLENOL) 500 MG tablet, Take 2 tablets (1,000 mg total) by mouth every 8 (eight) hours. (Patient taking differently: Take 1,000 mg by mouth as needed.), Disp: 30 tablet, Rfl: 0 .  allopurinol (ZYLOPRIM) 100 MG tablet, Take 100 mg by mouth daily., Disp: , Rfl:  .  Biotin 5 MG CAPS, Take 5 mg by mouth daily., Disp: , Rfl:  .  calcium-vitamin D (OSCAL WITH D) 500-200 MG-UNIT tablet, Take 1 tablet by mouth daily with breakfast., Disp: , Rfl:  .  DULoxetine (CYMBALTA) 30 MG capsule, Take 30 mg by mouth every morning., Disp: , Rfl:  .  fluticasone (FLONASE) 50 MCG/ACT nasal spray, Place 2 sprays into both nostrils daily as needed for allergies., Disp: , Rfl:  .  folic acid (FOLVITE) 1 MG tablet, Take 1 mg by mouth daily. , Disp: , Rfl:  .  gabapentin (NEURONTIN) 300  MG capsule, Take 300 mg by mouth 2 (two) times daily., Disp: , Rfl:  .  lubiprostone (AMITIZA) 24 MCG capsule, Take 24 mcg by mouth daily as needed for constipation., Disp: , Rfl:  .  metoprolol succinate (TOPROL-XL) 25 MG 24 hr tablet, Take 1 tablet (25 mg total) by mouth daily., Disp: 90 tablet, Rfl: 3 .  Multiple Vitamins-Minerals (MULTIVITAMINS THER. W/MINERALS) TABS, Take 1 tablet by mouth daily., Disp: , Rfl:  .  senna (SENOKOT) 8.6 MG tablet, Take 3 tablets by mouth daily., Disp: , Rfl:  .  Wheat Dextrin  (BENEFIBER DRINK MIX PO), Take 1 Scoop by mouth daily., Disp: , Rfl:  .  phentermine (ADIPEX-P) 37.5 MG tablet, Take 37.5 mg by mouth every morning., Disp: , Rfl:   Current Facility-Administered Medications:  .  0.9 %  sodium chloride infusion, 500 mL, Intravenous, Continuous, Pyrtle, Carie Caddy, MD  Past Medical Problems: Past Medical History:  Diagnosis Date  . Acne vulgaris 01/19/15  . Allergic rhinitis   . Anemia   . Anxiety   . Blood transfusion without reported diagnosis   . Borderline diabetes 11/14/11  . Constipation   . Eczematous dermatitis 11/08/2010   eczema  . Endometriosis   . Esophageal reflux   . Fibromyalgia   . Gout 02/08/12   idopathic gout, left ankle and foot  . Gynecomastia 11/18/13   hypertrophy of breast  . Hypertension    denies high blood pressure on 03/23/20  . Obesity, unspecified 07/04/05  . Osteoarthritis    Right hip and knee  . Paresthesias/numbness 11/14/11  . Pre-diabetes   . Premature menopause 07/09/13  . Sinus tachycardia   . 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    Past Surgical History: Past Surgical History:  Procedure Laterality Date  . BREAST REDUCTION SURGERY  08/20/2019   Procedure: MAMMARY REDUCTION  (BREAST);  Surgeon: Peggye Form, DO;  Location: [facility];  Service: Plastics;;  . Cardiac Event Monitor  03/2015   [facility]: Sinus rhythm with occasional sinus tachycardia. Rates as high as 122. No arrhythmias seen.  Marland Kitchen LIPOSUCTION  2014  . TOTAL HIP ARTHROPLASTY Right 05/23/2006  . TOTAL HIP ARTHROPLASTY Left 04/02/2018   Procedure: TOTAL HIP ARTHROPLASTY ANTERIOR APPROACH;  Surgeon: Durene Romans, MD;  Location: [facility];  Service: Orthopedics;  Laterality: Left;  . TRANSTHORACIC ECHOCARDIOGRAM  03/02/2015   [facility], Dr. Sherril Croon: Normal LV size, mild-moderate concentric LVH with mild  diffuse HK. EF~50%. Mild LA dilation. Could not exclude small ASD/PFO due to atrial septal dropout. Mild TR, mild MR.  Marland Kitchen TREADMILL STRESS ECHOCARDIOGRAM  07/2015   Exercised for 7 minutes-8.5 METS.  Terminated due to fatigue and reaching target heart rate -171 bpm which is under percent of expected).  No chest pain.  EKG negative for ischemia.  EF normal at baseline (55%) improved to 65% with exertion..  Baseline echo did not show evidence of LVH or R WMA.    Social History: Social History   Socioeconomic History  . Marital status: Married    Spouse name: Not on file  . Number of children: 0  . Years of education: Not on file  . Highest education level: Not on file  Occupational History  . Occupation: [employer]  Tobacco Use  . Smoking status: Never Smoker  . Smokeless tobacco: Never Used  Vaping Use  . Vaping Use: Never used  Substance  and Sexual Activity  . Alcohol use: No    Alcohol/week: 0.0 standard drinks  . Drug use: No  . Sexual activity: Not on file  Other Topics Concern  . Not on file  Social History Narrative   Married now for 3 years. Mother of one.   This with her spouse. Is a Estate manager/land agent for [employer]   Does not drink does not smoke and does not use illicit drugs. Does not exercise.   Social Determinants of Health   Financial Resource Strain: Not on file  Food Insecurity: Not on file  Transportation Needs: Not on file  Physical Activity: Not on file  Stress: Not on file  Social Connections: Not on file  Intimate Partner Violence: Not on file    Family History: Family History  Problem Relation Age of Onset  . Healthy Father   . Leukemia Mother   . Heart failure Maternal Aunt   . Breast cancer Maternal Aunt     Review of Systems: Review of Systems  Constitutional: Negative.   Respiratory: Negative.   Cardiovascular: Negative.   Gastrointestinal: Negative.   Neurological: Negative.     Physical Exam: Vital Signs BP (!) 147/91 (BP  Location: Left Arm, Patient Position: Sitting, Cuff Size: Large)   Pulse 94   Ht 5\' 6"  (1.676 m)   Wt 214 lb (97.1 kg)   LMP 07/14/2013 Comment: no periods >10yrs  SpO2 98%   BMI 34.54 kg/m   Physical Exam Constitutional:      General: Not in acute distress.    Appearance: Normal appearance. Not ill-appearing.  HENT:     Head: Normocephalic and atraumatic.  Eyes:     Pupils: Pupils are equal, round Neck:     Musculoskeletal: Normal range of motion.  Cardiovascular:     Rate and Rhythm: Normal rate and regular rhythm.     Pulses: Normal pulses.     Heart sounds: Normal heart sounds. No murmur.  Pulmonary:     Effort: Pulmonary effort is normal. No respiratory distress.     Breath sounds: Normal breath sounds. No wheezing.  Abdominal:     General: Abdomen is flat. There is no distension.  Musculoskeletal: Normal range of motion.  Skin:    General: Skin is warm and dry.     Findings: No erythema or rash.  Neurological:     General: No focal deficit present.     Mental Status: Alert and oriented to person, place, and time. Mental status is at baseline.     Motor: No weakness.  Psychiatric:        Mood and Affect: Mood normal.        Behavior: Behavior normal.    Assessment/Plan: The patient is scheduled for abdominoplasty with liposuction, bilateral medial thigh lift with liposuction, excision of bilateral lateral breast dogears, liposuction of excess axillary/breast tissue, liposuction of submental fat/chin with Dr. 9yr.  Risks, benefits, and alternatives of procedure discussed, questions answered and consent obtained.    Smoking Status: Non-smoker; Counseling Given? N/A Last Mammogram: 10/02/2018; Results: Negative  Caprini Score: 5, high; Risk Factors include: Age, BMI greater than 25, varicose veins, and length of planned surgery. Recommendation for mechanical and pharmacological prophylaxis for 7 to 10 days per guideline. Encourage early ambulation. Given the patient's  overall health history, do not feel postoperative anticoagulation will be necessary. We discussed the risks of DVT/PE and the signs and symptoms of this.   Patient did have preoperative cardiac evaluation for  her breast reduction on 08/20/2019.  This cardiology visit was on 06/23/2019.  At that time patient was at an acceptable risk for the planned bilateral breast reduction. Patient also reports she had a preoperative clearance with her PCP recently and reports she was cleared by her PCP.  She reports these results were faxed to us.  I have not received these fax results, I have discussed this with our surgical scheduling team who is also unaware of these results.  We will send a separate repeat clearance to her PCP provider today.  Pictures obtained: 02/03/2020  Post-op Rx sent to pharmacy: Norco, Zofran  Patient was provided with the General Surgical Risk consent document and Pain Medication Agreement prior to their appointment.  They had adequate time to read through the risk consent documents and Pain Medication Agreement. We also discussed them in person together during this preop appointment. All of their questions were answered to their satisfaction.  Recommended calling if they have any further questions.  Risk consent form and Pain Medication Agreement to be scanned into patient's chart.  The risks that can be encountered with and after liposuction were discussed and include the following but no limited to these:  Asymmetry, fluid accumulation, firmness of the area, fat necrosis with death of fat tissue, bleeding, infection, delayed healing, anesthesia risks, skin sensation changes, injury to structures including nerves, blood vessels, and muscles which may be temporary or permanent, allergies to tape, suture materials and glues, blood products, topical preparations or injected agents, skin and contour irregularities, skin discoloration and swelling, deep vein thrombosis, cardiac and pulmonary  complications, pain, which may persist, persistent pain, recurrence of the lesion, poor healing of the incision, possible need for revisional surgery or staged procedures. Thiere can also be persistent swelling, poor wound healing, rippling or loose skin, worsening of cellulite, swelling, and thermal burn or heat injury from ultrasound with the ultrasound-assisted lipoplasty technique. Any change in weight fluctuations can alter the outcome.  The risk that can be encountered for this procedure were discussed and include the following but not limited to these: asymmetry, fluid accumulation, firmness of the tissue, skin loss, decrease or no sensation, fat necrosis, bleeding, infection, healing delay.  Deep vein thrombosis, cardiac and pulmonary complications are risks to any procedure.  There are risks of anesthesia, changes to skin sensation and injury to nerves or blood vessels.  The muscle can be temporarily or permanently injured.  You may have an allergic reaction to tape, suture, glue, blood products which can result in skin discoloration, swelling, pain, skin lesions, poor healing.  Any of these can lead to the need for revisonal surgery or stage procedures.  Weight gain and weigh loss can also effect the long term appearance. The results are not guaranteed to last a lifetime.  Future surgery may be required.    We discussed the risks associated with the procedures listed above, all of her questions were answered to her content.   Electronically signed by: Kermit BaloMatthew J Scheeler, PA-C 03/30/2020 9:48 AM

## 2020-03-30 ENCOUNTER — Ambulatory Visit (INDEPENDENT_AMBULATORY_CARE_PROVIDER_SITE_OTHER): Payer: Self-pay | Admitting: Surgical

## 2020-03-30 ENCOUNTER — Encounter: Payer: Self-pay | Admitting: Surgical

## 2020-03-30 ENCOUNTER — Other Ambulatory Visit: Payer: Self-pay

## 2020-03-30 VITALS — BP 147/91 | HR 94 | Ht 66.0 in | Wt 214.0 lb

## 2020-03-30 DIAGNOSIS — N62 Hypertrophy of breast: Secondary | ICD-10-CM

## 2020-03-30 DIAGNOSIS — M546 Pain in thoracic spine: Secondary | ICD-10-CM

## 2020-03-30 DIAGNOSIS — Z719 Counseling, unspecified: Secondary | ICD-10-CM

## 2020-03-30 DIAGNOSIS — Z411 Encounter for cosmetic surgery: Secondary | ICD-10-CM

## 2020-03-30 DIAGNOSIS — M542 Cervicalgia: Secondary | ICD-10-CM

## 2020-03-30 DIAGNOSIS — G8929 Other chronic pain: Secondary | ICD-10-CM

## 2020-03-30 DIAGNOSIS — Z9889 Other specified postprocedural states: Secondary | ICD-10-CM

## 2020-03-30 MED ORDER — ONDANSETRON HCL 4 MG PO TABS: 4.0000 mg | ORAL_TABLET | Freq: Three times a day (TID) | ORAL | 0 refills | Status: DC | PRN

## 2020-03-30 MED ORDER — HYDROCODONE-ACETAMINOPHEN 5-325 MG PO TABS: 1.0000 | ORAL_TABLET | Freq: Four times a day (QID) | ORAL | 0 refills | Status: AC | PRN

## 2020-03-31 ENCOUNTER — Encounter: Payer: Self-pay | Admitting: Surgical

## 2020-03-31 ENCOUNTER — Telehealth: Payer: Self-pay | Admitting: Plastic Surgery

## 2020-03-31 NOTE — Progress Notes (Signed)
Surgical Clearance has been received from Dr. Fleet Contras for patient's upcoming surgery with Dr. Arita Miss. Medications to hold prior to surgery phentermine (Stop taking: 04/02/2020 begin retaking after following up with Dr. Concepcion Elk)

## 2020-03-31 NOTE — Telephone Encounter (Signed)
Called patient to confirm all of the procedures being done on 3/11 with Dr. Arita Miss. Patient expressed understanding and is good with everything we have listed. I also reiterated the notification to hold her phentermine starting 3/4 and not to resume until she sees her PCP - per the note from the PCP. Patient stated she has been off of the medication for 3 weeks and she will set up to see her PCP.

## 2020-04-08 ENCOUNTER — Ambulatory Visit: Payer: BC Managed Care – PPO | Admitting: Gastroenterology

## 2020-04-08 ENCOUNTER — Telehealth: Payer: Self-pay | Admitting: Surgical

## 2020-04-08 MED ORDER — OXYCODONE HCL 5 MG PO CAPS: 5.0000 mg | ORAL_CAPSULE | Freq: Four times a day (QID) | ORAL | 0 refills | Status: AC | PRN

## 2020-04-08 NOTE — Telephone Encounter (Signed)
Patient called to advise that she picked up her medication from the pharmacy and realized she was giving hydrocodone but that didn't help the pain at all when she had the breast reduction and she is having more done this time and would like something stronger like oxycodone. Her surgery is tomorrow and she doesn't want to go through the weekend in a lot of pain. Please call patient to advise. She uses [facility] in [place].

## 2020-04-12 ENCOUNTER — Other Ambulatory Visit: Payer: Self-pay

## 2020-04-12 ENCOUNTER — Ambulatory Visit (INDEPENDENT_AMBULATORY_CARE_PROVIDER_SITE_OTHER): Payer: Self-pay | Admitting: Surgical

## 2020-04-12 DIAGNOSIS — Z411 Encounter for cosmetic surgery: Secondary | ICD-10-CM

## 2020-04-12 NOTE — Progress Notes (Signed)
Patient is a 55 year old female here for follow-up after cosmetic surgery on Friday, 04/09/2020 with Dr. Arita Miss.  She had an abdominoplasty, excision of bilateral lateral breast dogears, bilateral medial thigh lift and liposuction to chin.  She reports she is overall doing okay, she is frustrated that her dressing is not holding up well.  She reports that she is a little bit constipated, but has been using laxatives and drinking water.  She reports that she is using Tylenol and ibuprofen for pain control at this point.  Her bilateral JP drains are still in place.  She is not having any infectious symptoms.  Chaperone present on exam On exam bilateral medial thigh and abdominoplasty incision is intact.  Bilateral breast incisions are intact.  Bilateral NAC's are viable.  She has swelling and bruising as expected.  There is no erythema or cellulitic changes noted.  No incisional dehiscence is noted on exam.  All of her dressings were removed, I replaced all of the dressings with new 4 x 4 gauze and ABD pads.  I wrapped her abdomen, thigh and breast with Ace wraps.  I reapplied her abdominal binder.  She has an appointment for follow-up in 2 days with Dr. Arita Miss for her 1 week postop visit.  I recommend she call with any questions or concerns.  I see no signs of infection, seroma or hematoma at this time.

## 2020-04-14 ENCOUNTER — Encounter: Payer: Self-pay | Admitting: Plastic Surgery

## 2020-04-14 ENCOUNTER — Other Ambulatory Visit: Payer: Self-pay

## 2020-04-14 ENCOUNTER — Ambulatory Visit (INDEPENDENT_AMBULATORY_CARE_PROVIDER_SITE_OTHER): Payer: Self-pay | Admitting: Plastic Surgery

## 2020-04-14 VITALS — BP 121/84 | HR 105

## 2020-04-14 DIAGNOSIS — Z411 Encounter for cosmetic surgery: Secondary | ICD-10-CM

## 2020-04-14 NOTE — Progress Notes (Signed)
Patient is here postop from submental liposuction, bilateral axillary liposuction and dogear revision from a breast reduction, abdominoplasty with liposuction, and medial thigh lift with liposuction.  She feels like she is coming along after a big operation.  She is uncertain of how much her drains are putting out but expects it to be 25 to 30 cc a day.  On exam her submental area looks to be doing fine with minimal bruising.  The axillary areas look to be healing well with improved contour.  The abdominoplasty flap looks entirely viable with an intact incision.  Her bilateral upper thigh incisions are intact with a expected amount of bruising in the abdominal and thigh areas.  We discussed continuing to apply compressive garments and avoiding strenuous activity.  She is going to modify her garments to find some that are easier to get on and off which I think is totally reasonable at this point.  She is walking every hour to avoid blood clots and wearing compression stockings.  She is very committed to a optimal result and seems to be doing well thus far.  I will plan to see her next week to hopefully remove at least one of her drains.  All her questions were answered.

## 2020-04-15 ENCOUNTER — Encounter: Payer: BC Managed Care – PPO | Admitting: Plastic Surgery

## 2020-04-20 ENCOUNTER — Telehealth: Payer: Self-pay | Admitting: Cardiology

## 2020-04-20 NOTE — Telephone Encounter (Signed)
[facility] received paperwork via fax from the [facility] to be completed, paperwork was put in providers box.

## 2020-04-22 ENCOUNTER — Other Ambulatory Visit: Payer: Self-pay

## 2020-04-22 ENCOUNTER — Encounter: Payer: Self-pay | Admitting: Plastic Surgery

## 2020-04-22 ENCOUNTER — Ambulatory Visit (INDEPENDENT_AMBULATORY_CARE_PROVIDER_SITE_OTHER): Payer: Self-pay | Admitting: Plastic Surgery

## 2020-04-22 ENCOUNTER — Encounter: Payer: BC Managed Care – PPO | Admitting: Plastic Surgery

## 2020-04-22 VITALS — BP 119/79 | HR 83

## 2020-04-22 DIAGNOSIS — Z411 Encounter for cosmetic surgery: Secondary | ICD-10-CM

## 2020-04-22 NOTE — Progress Notes (Signed)
Patient presents two weeks postop from bilateral axillary soft tissue excision, abdominal plasty, and medial thigh lift with liposuction. She feels like she's making progress after a big operation. She's worried about some separation in the wound in her inner thighs. Her drains are still putting out 40 to 50 mL per day. On exam the axillary areas and incisions look to be healing nicely. Her abdomen looks to be doing well also. I do not see any subcutaneous fluid or wound separation there. For her thighs there's a millimeter or two of wound separation along the most posterior aspect of her incision on both sides. There's some mild erythema around the incisions but I do not appreciate any fluctuance or subcutaneous fluid. Her thighs are still a bit swollen and bruised and there are a few firm areas that I think are consistent with what would be expected at this point. We'll plan to leave the drains in and continue to apply compression as much as possible. I'll plan to see her next week to hopefully remove some drains. All of her questions were answered.

## 2020-04-28 ENCOUNTER — Other Ambulatory Visit: Payer: Self-pay

## 2020-04-28 ENCOUNTER — Telehealth: Payer: Self-pay

## 2020-04-28 ENCOUNTER — Encounter: Payer: Self-pay | Admitting: Plastic Surgery

## 2020-04-28 ENCOUNTER — Ambulatory Visit (INDEPENDENT_AMBULATORY_CARE_PROVIDER_SITE_OTHER): Payer: Self-pay | Admitting: Plastic Surgery

## 2020-04-28 VITALS — BP 132/76 | HR 130 | Temp 98.6°F

## 2020-04-28 DIAGNOSIS — Z411 Encounter for cosmetic surgery: Secondary | ICD-10-CM

## 2020-04-28 MED ORDER — DOXYCYCLINE HYCLATE 100 MG PO TABS: 100.0000 mg | ORAL_TABLET | Freq: Two times a day (BID) | ORAL | 0 refills | Status: DC

## 2020-04-28 MED ORDER — FLUCONAZOLE 150 MG PO TABS: 150.0000 mg | ORAL_TABLET | Freq: Once | ORAL | 0 refills | Status: AC

## 2020-04-28 NOTE — Telephone Encounter (Signed)
Patient is requesting 4 tablets of Diflucan and indicated she gets bad yeast infections when taking ABX. Advised her that the normal dose is 1 tablet, but would check with Dr. Arita Miss and call her back.

## 2020-04-28 NOTE — Addendum Note (Signed)
Addended by: Allena Napoleon on: 04/28/2020 01:35 PM   Modules accepted: Orders

## 2020-04-28 NOTE — Telephone Encounter (Signed)
Patient called to say that she is at the pharmacy and they only have one Diflucan for her and she needs four.  Patient asked that we please take care of this as soon as possible.  Please call.

## 2020-04-28 NOTE — Progress Notes (Signed)
Patient presents 3 weeks postop from liposuction of her chin, liposuction and excision of axillary tissue, abdominoplasty, and medial thigh lift.  She feels like she is making progress but has some pain around her thighs and some pain around her left abdominoplasty incision.  She did have a drain fall out over the weekend on that side.  The drain on the right side is putting out somewhere between 70 and 80 cc/day over the past few days.  On exam all of her incisions are intact.  There is a little bit of redness and cellulitis focused around the drain site on the left side where the drain was removed from.  Her thigh incisions are intact with a few small 1 to 2 mm openings that have not changed.  She reports subjective fevers intermittently at home but she is afebrile today in the office with normal vital signs.  I did not leave the drain in on her abdomen on the right side for another week given its output.  I am planning to prescribe doxycycline to her for what looks like a little bit of cellulitis around the left hip area.  We will plan to continue compressive wraps and she knows to call us with any changes in her condition.  I will plan to see her again next week.

## 2020-04-29 ENCOUNTER — Other Ambulatory Visit (INDEPENDENT_AMBULATORY_CARE_PROVIDER_SITE_OTHER): Payer: BC Managed Care – PPO | Admitting: Plastic Surgery

## 2020-04-29 DIAGNOSIS — Z411 Encounter for cosmetic surgery: Secondary | ICD-10-CM

## 2020-04-29 MED ORDER — CIPROFLOXACIN HCL 500 MG PO TABS: 500.0000 mg | ORAL_TABLET | Freq: Two times a day (BID) | ORAL | 0 refills | Status: DC

## 2020-04-29 MED ORDER — FLUCONAZOLE 150 MG PO TABS: 150.0000 mg | ORAL_TABLET | ORAL | 0 refills | Status: DC | PRN

## 2020-04-29 NOTE — Progress Notes (Signed)
I called Laurie [name] today to check on her.  She feels about the same.  She feels subjectively warm but she does not have a fever as she is measuring her temperature regularly.  She says her skin incisions are no worse.  The redness along her abdominal incision on the left side has not progressed.  She has tolerated most of the doses of the doxycycline but did have some nausea related to one of the pills this morning.  I am going to call her in a course of Cipro to add to the doxycycline to broaden her coverage as she is still having symptoms.  She will plan to keep [place] posted on her progress and certainly will let us know if she feels any worse or develops any objective fevers.

## 2020-04-30 ENCOUNTER — Telehealth: Payer: Self-pay

## 2020-04-30 ENCOUNTER — Encounter: Payer: Self-pay | Admitting: Plastic Surgery

## 2020-04-30 NOTE — Telephone Encounter (Signed)
Patient called to let us know that Dr. Arita Miss called her in a new medication last night (Ciprofloxacin 500mg ).  Patient said that she took her first dose this morning at 9am and by 12noon she had welts on the back of her thighs and her elbows are swollen.  She will discontinue the medication.  Please call.

## 2020-04-30 NOTE — Telephone Encounter (Signed)
Called patient to discuss her concerns with the antibiotic, phone call was sent to voicemail. I did not leave a voicemail. If patient calls back, please inform her to hold the ciprofloxacin and she can continue with the doxycycline as she previously was tolerating this.

## 2020-04-30 NOTE — Telephone Encounter (Signed)
Called patient to offer appointment for today after receiving message from her stating her infection was still oozing pus. Patient declined an office visit today with the physician assistant and stated her message was just an update on her situation and she would prefer to see Dr. Arita Miss. She will keep her already scheduled appointment on 05/05/2020; but will call us if her symptoms worsen in the meantime.

## 2020-05-03 ENCOUNTER — Other Ambulatory Visit: Payer: Self-pay

## 2020-05-03 ENCOUNTER — Ambulatory Visit (INDEPENDENT_AMBULATORY_CARE_PROVIDER_SITE_OTHER): Payer: BC Managed Care – PPO | Admitting: Plastic Surgery

## 2020-05-03 ENCOUNTER — Encounter: Payer: Self-pay | Admitting: Plastic Surgery

## 2020-05-03 VITALS — BP 114/75

## 2020-05-03 DIAGNOSIS — Z411 Encounter for cosmetic surgery: Secondary | ICD-10-CM

## 2020-05-03 MED ORDER — AMOXICILLIN 500 MG PO CAPS: 500.0000 mg | ORAL_CAPSULE | Freq: Three times a day (TID) | ORAL | 0 refills | Status: DC

## 2020-05-03 NOTE — Telephone Encounter (Signed)
Patient is coming in office today.

## 2020-05-03 NOTE — Progress Notes (Signed)
Patient presents just under a month postop from submental liposuction, excision and liposuction of dogears from her breast reduction, abdominoplasty, medial thigh lift.  I had some concerns for cellulitis on the lower aspect of her abdominal incision on the left side last week.  I sent her in doxycycline and then added Cipro a couple days later as she felt like she was not progressing.  It seems like she has had reactions to both antibiotics and has since stopped them.  She has not had any documented fevers and overall feels better today than she did last week.  Her remaining abdominoplasty drain has been putting out less than 20 cc a day so this was removed today.  On examination her incisions look quite a bit better than they did before.  I do not see any fluctuance or purulence.  The majority of the erythema along the left lower abdominal incision is gone.  The remainder of her incisions look to be healing fine.  We reviewed her vital signs today and she is afebrile, normotensive and is not tachycardic.  Overall encouraged with her progress.  I do think she benefit from the end of antibiotics although she only took them for a few days.  She has taken amoxicillin in the past and feels like she does well with that somebody try that to see if she can complete some semblance of a course.  We will get a plan to see her next week to reevaluate.  She knows to call us with any changes.

## 2020-05-04 ENCOUNTER — Telehealth: Payer: Self-pay

## 2020-05-04 NOTE — Telephone Encounter (Signed)
Called patient and left message that Dr.Harding can not  complete [employer] paperwork from a cardiac standpoint.

## 2020-05-04 NOTE — Telephone Encounter (Signed)
Patient called to let us know she has noticed new symptoms of swelling in both feet, toes, and lower legs. She requested to update Dr. Arita Miss with this information.

## 2020-05-04 NOTE — Telephone Encounter (Signed)
Forward message to [facility] and Dr. Arita Miss

## 2020-05-05 ENCOUNTER — Telehealth: Payer: Self-pay

## 2020-05-05 ENCOUNTER — Ambulatory Visit: Payer: BC Managed Care – PPO | Admitting: Plastic Surgery

## 2020-05-05 NOTE — Telephone Encounter (Signed)
Patient called to say that a rash has spread all over her legs and thighs.  Patient said that the rash has not gotten better, but worse since Monday.  Patient said that her ankles are swollen.  Patient said she feels like she needs to come in today.  Please call.

## 2020-05-06 ENCOUNTER — Encounter: Payer: Self-pay | Admitting: Plastic Surgery

## 2020-05-06 ENCOUNTER — Other Ambulatory Visit: Payer: Self-pay

## 2020-05-06 ENCOUNTER — Ambulatory Visit (INDEPENDENT_AMBULATORY_CARE_PROVIDER_SITE_OTHER): Payer: BC Managed Care – PPO | Admitting: Plastic Surgery

## 2020-05-06 VITALS — BP 125/83 | HR 111

## 2020-05-06 DIAGNOSIS — Z96643 Presence of artificial hip joint, bilateral: Secondary | ICD-10-CM | POA: Insufficient documentation

## 2020-05-06 DIAGNOSIS — Z79899 Other long term (current) drug therapy: Secondary | ICD-10-CM | POA: Insufficient documentation

## 2020-05-06 DIAGNOSIS — Z881 Allergy status to other antibiotic agents status: Secondary | ICD-10-CM | POA: Diagnosis not present

## 2020-05-06 DIAGNOSIS — I10 Essential (primary) hypertension: Secondary | ICD-10-CM | POA: Diagnosis not present

## 2020-05-06 DIAGNOSIS — R600 Localized edema: Secondary | ICD-10-CM | POA: Diagnosis not present

## 2020-05-06 DIAGNOSIS — T3695XA Adverse effect of unspecified systemic antibiotic, initial encounter: Secondary | ICD-10-CM | POA: Diagnosis not present

## 2020-05-06 DIAGNOSIS — R7402 Elevation of levels of lactic acid dehydrogenase (LDH): Secondary | ICD-10-CM | POA: Diagnosis not present

## 2020-05-06 DIAGNOSIS — Z411 Encounter for cosmetic surgery: Secondary | ICD-10-CM

## 2020-05-06 DIAGNOSIS — D500 Iron deficiency anemia secondary to blood loss (chronic): Secondary | ICD-10-CM | POA: Insufficient documentation

## 2020-05-06 DIAGNOSIS — D649 Anemia, unspecified: Secondary | ICD-10-CM | POA: Diagnosis not present

## 2020-05-06 DIAGNOSIS — R21 Rash and other nonspecific skin eruption: Secondary | ICD-10-CM | POA: Diagnosis not present

## 2020-05-06 DIAGNOSIS — R7401 Elevation of levels of liver transaminase levels: Secondary | ICD-10-CM | POA: Diagnosis not present

## 2020-05-06 MED ORDER — LORATADINE 10 MG PO TABS: 10.0000 mg | ORAL_TABLET | Freq: Every day | ORAL | 11 refills | Status: AC

## 2020-05-06 MED ORDER — TRIAMCINOLONE ACETONIDE 0.1 % EX CREA: 1.0000 "application " | TOPICAL_CREAM | Freq: Two times a day (BID) | CUTANEOUS | 0 refills | Status: DC

## 2020-05-06 NOTE — Telephone Encounter (Signed)
Patient came for office visit today. 

## 2020-05-06 NOTE — Progress Notes (Signed)
Laurie [name] comes back today complaining of rash that is developed and worsened over the past few days.  She had a little bit of cellulitis along her incision and I noticed last week and so I started her on antibiotics about a week ago.  At the earlier part of this week she noticed a erythematous itchy rash on her legs that is now spread to the front surface of her legs, her trunk and her upper arms as well.  She feels like her legs are swelling also symmetrically on each side.  On exam she does have a rash in that distribution.  There is no blistering and no weeping of the skin.  The areas along her incisions where there was some previous cellulitis and erythema seem to be improved.  She did call me last night and I asked her to stop all antibiotics.  I am planning to give her a antihistamine and topical steroid to get her through.  I suspect now that we stopped all antibiotics that this will improve.  She continues to wear compression stockings and her compression garment.  She is walking regularly.  There is no specific calf tenderness on either side.  We will plan to see her again next week.

## 2020-05-06 NOTE — Telephone Encounter (Signed)
Patient came for office visit today.

## 2020-05-07 ENCOUNTER — Other Ambulatory Visit: Payer: Self-pay

## 2020-05-07 ENCOUNTER — Encounter (HOSPITAL_COMMUNITY): Payer: Self-pay

## 2020-05-07 ENCOUNTER — Emergency Department (HOSPITAL_COMMUNITY)
Admission: EM | Admit: 2020-05-07 | Discharge: 2020-05-07 | Disposition: A | Discharge status: Home / Self Care | Payer: BC Managed Care – PPO | Attending: Emergency Medicine | Admitting: Emergency Medicine

## 2020-05-07 DIAGNOSIS — D649 Anemia, unspecified: Secondary | ICD-10-CM

## 2020-05-07 DIAGNOSIS — T50905A Adverse effect of unspecified drugs, medicaments and biological substances, initial encounter: Secondary | ICD-10-CM | POA: Diagnosis not present

## 2020-05-07 DIAGNOSIS — R7401 Elevation of levels of liver transaminase levels: Secondary | ICD-10-CM

## 2020-05-07 DIAGNOSIS — T3695XA Adverse effect of unspecified systemic antibiotic, initial encounter: Secondary | ICD-10-CM

## 2020-05-07 DIAGNOSIS — R609 Edema, unspecified: Secondary | ICD-10-CM

## 2020-05-07 LAB — COMPREHENSIVE METABOLIC PANEL
ALT: 29 U/L (ref 0–44)
AST: 52 U/L — ABNORMAL HIGH (ref 15–41)
Albumin: 3.3 g/dL — ABNORMAL LOW (ref 3.5–5.0)
Alkaline Phosphatase: 65 U/L (ref 38–126)
Anion gap: 7 (ref 5–15)
BUN: 19 mg/dL (ref 6–20)
CO2: 24 mmol/L (ref 22–32)
Calcium: 8.6 mg/dL — ABNORMAL LOW (ref 8.9–10.3)
Chloride: 112 mmol/L — ABNORMAL HIGH (ref 98–111)
Creatinine, Ser: 1.11 mg/dL — ABNORMAL HIGH (ref 0.44–1.00)
GFR, Estimated: 59 mL/min — ABNORMAL LOW (ref >60)
Glucose, Bld: 113 mg/dL — ABNORMAL HIGH (ref 70–99)
Potassium: 3.9 mmol/L (ref 3.5–5.1)
Sodium: 143 mmol/L (ref 135–145)
Total Bilirubin: 0.4 mg/dL (ref 0.3–1.2)
Total Protein: 7.3 g/dL (ref 6.5–8.1)

## 2020-05-07 LAB — CBC WITH DIFFERENTIAL/PLATELET
Abs Immature Granulocytes: 0.02 10*3/uL (ref 0.00–0.07)
Basophils Absolute: 0 10*3/uL (ref 0.0–0.1)
Basophils Relative: 1 %
Eosinophils Absolute: 0.3 10*3/uL (ref 0.0–0.5)
Eosinophils Relative: 4 %
HCT: 31.7 % — ABNORMAL LOW (ref 36.0–46.0)
Hemoglobin: 9.3 g/dL — ABNORMAL LOW (ref 12.0–15.0)
Immature Granulocytes: 0 %
Lymphocytes Relative: 34 %
Lymphs Abs: 2.6 10*3/uL (ref 0.7–4.0)
MCH: 24.7 pg — ABNORMAL LOW (ref 26.0–34.0)
MCHC: 29.3 g/dL — ABNORMAL LOW (ref 30.0–36.0)
MCV: 84.3 fL (ref 80.0–100.0)
Monocytes Absolute: 0.4 10*3/uL (ref 0.1–1.0)
Monocytes Relative: 5 %
Neutro Abs: 4.2 10*3/uL (ref 1.7–7.7)
Neutrophils Relative %: 56 %
Platelets: 352 10*3/uL (ref 150–400)
RBC: 3.76 MIL/uL — ABNORMAL LOW (ref 3.87–5.11)
RDW: 15.7 % — ABNORMAL HIGH (ref 11.5–15.5)
WBC: 7.6 10*3/uL (ref 4.0–10.5)
nRBC: 0 % (ref 0.0–0.2)

## 2020-05-07 LAB — URINALYSIS, ROUTINE W REFLEX MICROSCOPIC
Bacteria, UA: NONE SEEN
Bilirubin Urine: NEGATIVE
Glucose, UA: NEGATIVE mg/dL
Ketones, ur: NEGATIVE mg/dL
Leukocytes,Ua: NEGATIVE
Nitrite: NEGATIVE
Protein, ur: 30 mg/dL — AB
Specific Gravity, Urine: 1.025 (ref 1.005–1.030)
pH: 5 (ref 5.0–8.0)

## 2020-05-07 LAB — PROTIME-INR
INR: 1.1 (ref 0.8–1.2)
Prothrombin Time: 13.4 seconds (ref 11.4–15.2)

## 2020-05-07 LAB — SEDIMENTATION RATE: Sed Rate: 50 mm/hr — ABNORMAL HIGH (ref 0–22)

## 2020-05-07 MED ORDER — FUROSEMIDE 40 MG PO TABS: 40.0000 mg | ORAL_TABLET | Freq: Every day | ORAL | 0 refills | Status: DC

## 2020-05-07 MED ORDER — DOXEPIN HCL 10 MG PO CAPS
10.0000 mg | ORAL_CAPSULE | ORAL | Status: AC
  Administered 2020-05-07: 10 mg via ORAL
  Filled 2020-05-07: qty 1

## 2020-05-07 MED ORDER — DEXAMETHASONE 4 MG PO TABS
10.0000 mg | ORAL_TABLET | Freq: Once | ORAL | Status: AC
  Administered 2020-05-07: 10 mg via ORAL
  Filled 2020-05-07: qty 2

## 2020-05-07 MED ORDER — FAMOTIDINE 20 MG PO TABS
20.0000 mg | ORAL_TABLET | Freq: Once | ORAL | Status: AC
  Administered 2020-05-07: 20 mg via ORAL
  Filled 2020-05-07: qty 1

## 2020-05-07 MED ORDER — DOXEPIN HCL 10 MG PO CAPS: 10.0000 mg | ORAL_CAPSULE | Freq: Three times a day (TID) | ORAL | 0 refills | Status: AC

## 2020-05-07 NOTE — ED Triage Notes (Signed)
Emergency Medicine Provider Triage Evaluation Note  Laurie [name] [facility] , a 55 y.o. female  was evaluated in triage.  Pt complains of rash and extremity swelling. Symptom onset 4-5 days ago. S/p tummy tuck on 04/09/20 complicated by postoperative infection. Was started on doxycycline 1 week ago (Wednesday) for this reason and ciprofloxacin was added on Friday. Took Ciprofloxacin x 1 day and felt aching in her joints. Was told to d/c this, which patient did; was switched to Amoxicillin. Has continued both Amoxicillin and doxycycline. Tolerated both of these abx in the past. Over the weekend began noticing an erythematous, pruritic rash to BLE with associated arthralgias, myalgias, lower extremity swelling. Using benadryl and compression stockings with minimal relief. No fevers, SOB, vomiting, difficulty swallowing, urinary symptoms.  Review of Systems  Positive: Rash, arthralgias, extremity swelling Negative: Fevers, SOB, dysphagia  Physical Exam  BP (!) 148/89 (BP Location: Left Arm)   Pulse (!) 110   Temp 98 F (36.7 C) (Oral)   Resp 18   Ht 5\' 7"  (1.702 m)   Wt 86 kg   LMP 07/14/2013 Comment: no periods >62yrs  SpO2 100%   BMI 29.69 kg/m  Gen:   Awake, no distress   HEENT:  Atraumatic  Resp:  Normal effort Cardiac:  Tachycardic rate Abd:   Nondistended MSK:   Moves extremities without difficulty. Edema to wrist joints. General   nonpitting edema to BLE. Skin:  Maculopapular, erythematous, nonblanching rash focused on   BLE and scattered on forearms. Largely sparing trunk. Neuro:  Speech clear, ambulatory.  Medical Decision Making  Medically screening exam initiated at 12:41 AM.  Appropriate orders placed.  Laurie [name] [facility] was informed that the remainder of the evaluation will be completed by another provider, this initial triage assessment does not replace that evaluation, and the importance of remaining in the ED until their evaluation is complete.  Clinical Impression   Symptoms and exam concerning for vasculitis, potentially secondary to postoperative infection. 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 DDx includes abx allergy, though no hx of reaction to abx prescribed. Pending labs for further evaluation.   [facility], PA-C 05/07/20 07/07/20

## 2020-05-07 NOTE — Discharge Instructions (Signed)
Stop taking all antibiotics.  Take the loratadine which was prescribed by Dr. Arita Miss.  You may add famotidine (Pepcid AC) twice a day to get additional antihistamine benefit.  Return to the emergency department if you start having difficulty breathing or swallowing.  Please note that with antibiotic allergic reactions, the rash may progress for 1-2 weeks after stopping the antibiotic.

## 2020-05-07 NOTE — ED Provider Notes (Signed)
[facility]-EMERGENCY DEPT Provider Note   CSN: [id number] Arrival date & time: 05/06/20  2345   History Chief Complaint  Patient presents with  . Edema    Laurie [name] is a 55 y.o. female.  The history is provided by the patient.  She has history of hypertension and comes in because a rash and swelling of her legs.  She had a tummy tuck operation on 3/11.  She saw her surgeon on 3/30 who was concerned that she was developing an infection and started her on doxycycline.  On 3/31, he added ciprofloxacin but she only took 1 dose because she developed some joint pain and swelling.  On either 4/2 or 4/3, she noted she was breaking out in itchy spots on her arms and legs.  On 4/4, amoxicillin was added to her antibiotic regimen.  Her rash seems to be spreading and legs have been getting swollen.  She denies any difficulty breathing or swallowing.  She denies any fever.  Rash is very pruritic and diphenhydramine does not seem to give her relief from the itching.  She did see her surgeon who prescribed loratadine for her, but she has not picked that up yet.  Past Medical History:  Diagnosis Date  . Acne vulgaris 01/19/15  . Allergic rhinitis   . Anemia   . Anxiety   . Blood transfusion without reported diagnosis   . Borderline diabetes 11/14/11  . Constipation   . Eczematous dermatitis 11/08/2010   eczema  . Endometriosis   . Esophageal reflux   . Fibromyalgia   . Gout 02/08/12   idopathic gout, left ankle and foot  . Gynecomastia 11/18/13   hypertrophy of breast  . Hypertension    denies high blood pressure on 03/23/20  . Obesity, unspecified 07/04/05  . Osteoarthritis    Right hip and knee  . Paresthesias/numbness 11/14/11  . Pre-diabetes   . Premature menopause 07/09/13  . Sinus tachycardia   . 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    Patient Active  Problem List   Diagnosis Date Noted  . S/P bilateral breast reduction 08/28/2019  . Back pain 06/06/2019  . Neck pain 06/06/2019  . Obese 04/03/2018  . S/P left THA 04/02/2018  . Status post left hip replacement 04/02/2018  . Primary osteoarthritis of both hands 02/16/2017  . Primary osteoarthritis of both feet 02/16/2017  . Primary osteoarthritis of both knees 02/16/2017  . 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 Unilateral primary osteoarthritis, left hip 02/16/2017  . Polyarthralgia 09/07/2016  . Other fatigue 09/07/2016  . Other insomnia 09/07/2016  . Gout of left foot 09/07/2016  . ANA positive 09/07/2016  . Chest pain with minimal risk for cardiac etiology 08/28/2015  . Palpitations 08/28/2015  . Allergic rhinitis   . Endometriosis   . Arthritis of right hip   . Fluid overload   . Fibromyalgia   . Sinus tachycardia   . Obesity, unspecified   . Eczematous dermatitis   . Paresthesias/numbness   . Esophageal reflux   . Edema of both legs   . Shortness of breath 06/21/2015  . Varicose veins of bilateral lower extremities with other complications 04/26/2015  . Spider veins of both lower extremities 04/12/2015  . Symptomatic mammary hypertrophy 11/18/2013  . Premature menopause 07/09/2013  . Gout 02/08/2012  . Menopause 11/14/2011    Past Surgical History:  Procedure Laterality  Date  . BREAST REDUCTION SURGERY  08/20/2019   Procedure: MAMMARY REDUCTION  (BREAST);  Surgeon: Peggye Form, DO;  Location: [facility];  Service: Plastics;;  . Cardiac Event Monitor  03/2015   [facility]: Sinus rhythm with occasional sinus tachycardia. Rates as high as 122. No arrhythmias seen.  Marland Kitchen LIPOSUCTION  2014  . TOTAL HIP ARTHROPLASTY Right 05/23/2006  . TOTAL HIP ARTHROPLASTY Left 04/02/2018   Procedure: TOTAL HIP ARTHROPLASTY ANTERIOR APPROACH;  Surgeon: Durene Romans, MD;  Location: [facility];  Service: Orthopedics;  Laterality: Left;  .  TRANSTHORACIC ECHOCARDIOGRAM  03/02/2015   [facility], Dr. Sherril Croon: Normal LV size, mild-moderate concentric LVH with mild diffuse HK. EF~50%. Mild LA dilation. Could not exclude small ASD/PFO due to atrial septal dropout. Mild TR, mild MR.  Marland Kitchen TREADMILL STRESS ECHOCARDIOGRAM  07/2015   Exercised for 7 minutes-8.5 METS.  Terminated due to fatigue and reaching target heart rate -171 bpm which is under percent of expected).  No chest pain.  EKG negative for ischemia.  EF normal at baseline (55%) improved to 65% with exertion..  Baseline echo did not show evidence of LVH or R WMA.     OB History   No obstetric history on file.     Family History  Problem Relation Age of Onset  . Healthy Father   . Leukemia Mother   . Heart failure Maternal Aunt   . Breast cancer Maternal Aunt     Social History   Tobacco Use  . Smoking status: Never Smoker  . Smokeless tobacco: Never Used  Vaping Use  . Vaping Use: Never used  Substance Use Topics  . Alcohol use: No    Alcohol/week: 0.0 standard drinks  . Drug use: No    Home Medications Prior to Admission medications   Medication Sig Start Date End Date Taking? Authorizing Provider  acetaminophen (TYLENOL) 500 MG tablet Take 2 tablets (1,000 mg total) by mouth every 8 (eight) hours. Patient taking differently: Take 1,000 mg by mouth as needed. 04/03/18   Lanney Gins, PA-C  allopurinol (ZYLOPRIM) 100 MG tablet Take 100 mg by mouth daily. 07/28/14   [provider]  amoxicillin (AMOXIL) 500 MG capsule Take 1 capsule (500 mg total) by mouth 3 (three) times daily. 05/03/20   Allena Napoleon, MD  Biotin 5 MG CAPS Take 5 mg by mouth daily.    [provider]  calcium-vitamin D (OSCAL WITH D) 500-200 MG-UNIT tablet Take 1 tablet by mouth daily with breakfast.    [provider]  ciprofloxacin (CIPRO) 500 MG tablet Take 1 tablet (500 mg total) by mouth 2 (two) times daily. 04/29/20   Allena Napoleon, MD  doxycycline  (VIBRA-TABS) 100 MG tablet Take 1 tablet (100 mg total) by mouth 2 (two) times daily. 04/28/20   Allena Napoleon, MD  DULoxetine (CYMBALTA) 30 MG capsule Take 30 mg by mouth every morning. 02/18/20   [provider]  fluconazole (DIFLUCAN) 150 MG tablet Take 1 tablet (150 mg total) by mouth as needed. 04/29/20   Allena Napoleon, MD  fluticasone (FLONASE) 50 MCG/ACT nasal spray Place 2 sprays into both nostrils daily as needed for allergies. 01/01/18   [provider]  folic acid (FOLVITE) 1 MG tablet Take 1 mg by mouth daily.     [provider]  gabapentin (NEURONTIN) 300 MG capsule Take 300 mg by mouth 2 (two) times daily. 02/13/18   [provider]  loratadine (CLARITIN)  10 MG tablet Take 1 tablet (10 mg total) by mouth daily. 05/06/20   Allena NapoleonPace, Collier S, MD  lubiprostone (AMITIZA) 24 MCG capsule Take 24 mcg by mouth daily as needed for constipation.    [provider]  metoprolol succinate (TOPROL-XL) 25 MG 24 hr tablet Take 1 tablet (25 mg total) by mouth daily. 08/26/15   Marykay LexHarding, Oney Folz W, MD  Multiple Vitamins-Minerals (MULTIVITAMINS THER. W/MINERALS) TABS Take 1 tablet by mouth daily.    [provider]  ondansetron (ZOFRAN) 4 MG tablet Take 1 tablet (4 mg total) by mouth every 8 (eight) hours as needed for nausea or vomiting. 03/30/20   Scheeler, Kermit BaloMatthew J, PA-C  phentermine (ADIPEX-P) 37.5 MG tablet Take 37.5 mg by mouth every morning. 02/18/20   [provider]  senna (SENOKOT) 8.6 MG tablet Take 3 tablets by mouth daily.    [provider]  triamcinolone cream (KENALOG) 0.1 % Apply 1 application topically 2 (two) times daily. 05/06/20   Allena NapoleonPace, Collier S, MD  Wheat Dextrin (BENEFIBER DRINK MIX PO) Take 1 Scoop by mouth daily.    [provider]    Allergies    Sulfa antibiotics and Sulfasalazine  Review of Systems   Review of Systems  All other systems reviewed and are negative.   Physical Exam Updated Vital  Signs BP (!) 148/89 (BP Location: Left Arm)   Pulse (!) 110   Temp 98 F (36.7 C) (Oral)   Resp 18   Ht 5\' 7"  (1.702 m)   Wt 86 kg   LMP 07/14/2013 Comment: no periods >567yrs  SpO2 100%   BMI 29.69 kg/m   Physical Exam Vitals and nursing note reviewed.   55 year old female, resting comfortably and in no acute distress. Vital signs are significant for elevated blood pressure and slightly elevated heart rate. Oxygen saturation is 100%, which is normal. Head is normocephalic and atraumatic. PERRLA, EOMI. Oropharynx is clear. Neck is nontender and supple without adenopathy or JVD. Back is nontender and there is no CVA tenderness. Lungs are clear without rales, wheezes, or rhonchi. Chest is nontender. Heart has regular rate and rhythm without murmur. Abdomen is soft, flat, nontender without masses or hepatosplenomegaly and peristalsis is normoactive. Extremities have 2-3+ edema, full range of motion is present. Skin is warm and dry.  Rash is present over the legs and lesser extent over the arms consisting of raised erythematous papules.  No petechiae are seen.  Rash seems to be concentrated at the top of her sock line. Neurologic: Mental status is normal, cranial nerves are intact, there are no motor or sensory deficits.  ED Results / Procedures / Treatments   Labs (all labs ordered are listed, but only abnormal results are displayed) Labs Reviewed  CBC WITH DIFFERENTIAL/PLATELET - Abnormal; Notable for the following components:      Result Value   RBC 3.76 (*)    Hemoglobin 9.3 (*)    HCT 31.7 (*)    MCH 24.7 (*)    MCHC 29.3 (*)    RDW 15.7 (*)    All other components within normal limits  COMPREHENSIVE METABOLIC PANEL - Abnormal; Notable for the following components:   Chloride 112 (*)    Glucose, Bld 113 (*)    Creatinine, Ser 1.11 (*)    Calcium 8.6 (*)    Albumin 3.3 (*)    AST 52 (*)    GFR, Estimated 59 (*)    All other components within normal limits  URINALYSIS,  ROUTINE W REFLEX MICROSCOPIC - Abnormal; Notable for the following components:   Hgb urine dipstick MODERATE (*)    Protein, ur 30 (*)    All other components within normal limits  PROTIME-INR  SEDIMENTATION RATE   Procedures Procedures   Medications Ordered in ED Medications  doxepin (SINEQUAN) capsule 10 mg (10 mg Oral Given 05/07/20 0205)  dexamethasone (DECADRON) tablet 10 mg (10 mg Oral Given 05/07/20 0205)  famotidine (PEPCID) tablet 20 mg (20 mg Oral Given 05/07/20 0205)    ED Course  I have reviewed the triage vital signs and the nursing notes.  Pertinent lab results that were available during my care of the patient were reviewed by me and considered in my medical decision making (see chart for details).  MDM Rules/Calculators/A&P Rash and peripheral edema which seem likely to be from an allergy to her antibiotics-either ciprofloxacin or doxycycline.  Statistically, ciprofloxacin allergy would be more likely.  Urinalysis has small amount of proteinuria, and albumin is slightly low.  I do not feel albumin is low enough to account for her degree of edema.  Renal function is stable.  She is advised to discontinue all antibiotics.  She expresses concern about course of prednisone stating that she tends to blow up with that and she is also concerned about wound healing from her recent surgery.  She is given a single dose of dexamethasone and discharged with prescription for doxepin to try to better control her itching.  Advised her to supplement her antihistamines with loratadine to add H2 blocker effect.  She is also given a prescription for a short course of furosemide to try to get her swelling down.  She is to follow-up with her PCP in 3 days, continue following up with her plastic surgeon.  Return precautions discussed.  Final Clinical Impression(s) / ED Diagnoses Final diagnoses:  Allergic reaction due to antibacterial drug  Peripheral edema  Normocytic anemia  Elevated AST (SGOT)     Rx / DC Orders ED Discharge Orders         Ordered    doxepin (SINEQUAN) 10 MG capsule  3 times daily        05/07/20 0157    furosemide (LASIX) 40 MG tablet  Daily        05/07/20 0157           Dione Booze, MD 05/07/20 [phone]

## 2020-05-07 NOTE — ED Triage Notes (Signed)
Pt to ED from home with c/o swelling to all limbs. Pt had a tummy tuck 3/11 and believes she may be having an allergic reaction to antibiotics (doxycycline, cipro, amoxicillin). Pt states the swelling has been gradually getting worse since her surgery. Arrives A+O, VSS.

## 2020-05-12 ENCOUNTER — Telehealth: Payer: Self-pay | Admitting: Plastic Surgery

## 2020-05-12 ENCOUNTER — Ambulatory Visit (INDEPENDENT_AMBULATORY_CARE_PROVIDER_SITE_OTHER): Payer: BC Managed Care – PPO | Admitting: Plastic Surgery

## 2020-05-12 ENCOUNTER — Other Ambulatory Visit: Payer: Self-pay

## 2020-05-12 ENCOUNTER — Encounter: Payer: Self-pay | Admitting: Plastic Surgery

## 2020-05-12 VITALS — BP 138/92 | HR 112

## 2020-05-12 DIAGNOSIS — Z411 Encounter for cosmetic surgery: Secondary | ICD-10-CM

## 2020-05-12 NOTE — Progress Notes (Signed)
Patient presents now about a month postop from her surgery.  She feels like she is heading in the right direction.  She did have a cellulitis around her abdominal incision on the left side and that has since improved.  Unfortunately she did have a drug reaction I believe to the Cipro that she took for that which caused her to break out in a rash over the majority of her body.  That is getting better now.  She did go to the emergency room and got some Lasix and a dose of steroids which seemed to help quite a bit.  She feels like the swelling is down a lot and that she is moving around much better.  On exam all of her incisions look to be healing well.  Her contour is much improved.  She still does have a bit of a transition on her abdomen between her abdomen and her suprapubic area but hopefully that will smooth out as the swelling continues to go down.  I am planning to see her again in 2 weeks.  All of her questions were answered.

## 2020-05-12 NOTE — Addendum Note (Signed)
Addended by: Verdie Shire on: 05/12/2020 10:50 AM   Modules accepted: Orders

## 2020-05-12 NOTE — Telephone Encounter (Signed)
Patient called to find out if she is able to have a mmg now. She had breast reduction surgery last July and she didn't get a mmg in 2021 since it was too close to her reduction. She wants to know if she can get one now. Please call to advise.

## 2020-05-13 NOTE — Telephone Encounter (Signed)
Called and spoke with the patient regarding the message below.  Informed her that I spoke with Dr. Arita Miss and he stated for her to wait 1 more month before getting a Mammogram .  Patient verbalized understanding and agreed.//AB/CMA

## 2020-05-26 ENCOUNTER — Other Ambulatory Visit: Payer: Self-pay

## 2020-05-26 ENCOUNTER — Ambulatory Visit (INDEPENDENT_AMBULATORY_CARE_PROVIDER_SITE_OTHER): Payer: BC Managed Care – PPO | Admitting: Plastic Surgery

## 2020-05-26 DIAGNOSIS — Z411 Encounter for cosmetic surgery: Secondary | ICD-10-CM

## 2020-05-26 NOTE — Progress Notes (Signed)
Patient presents about 6 weeks out from surgery.  She is doing much better.  She feels like she is back to her self and is overall happy with her results.  She is bothered by a bit of excess along her abdominoplasty incision but otherwise feels like things are going well.  She is happy with the lateral breast areas and is overall happy with her thighs as well.  On exam all incisions have healed nicely at this point.  She does have more tissue than either 1 of [place] would like superior to her abdominoplasty incision getting a bit of a step-off in that location.  She has a nice contour to her thighs and lateral chest areas.  She also points out some inverted nipples that of been present since her breast reduction.  We revisited the additional work that she is interested in having done which includes liposuction of the back with potential excision of what would essentially be an upper body lift.  I do not think she would benefit much from a circumferential extension of her abdominoplasty we could liposuction her lower back and she is interested in some of his volume being injected into her buttock and her right lateral buttock to improve symmetry that she reports was distorted by her hip replacement approaches.  I explained we need to wait at least another 4 months or so before taking on this additional surgery to give her more time to heal but she does seem to be doing really well at this point.  We will plan to look for surgery date sometime in September but I will definitely see her at some point between now and then.  All of her questions were answered.

## 2020-06-10 ENCOUNTER — Telehealth: Payer: Self-pay

## 2020-06-10 NOTE — Telephone Encounter (Signed)
Returned patient call. She is fine at this time waiting for her appointment 07/21/20, but wanted to let us know the left is still swollen, feeling like pressure and pulling. Denies any fever, chills, nausea nor vomiting. Advised her to call if it continues to worsen before her next appointment. Patient understood and agreed with plan.

## 2020-06-10 NOTE — Telephone Encounter (Signed)
Patient called to say she is having tightness on the left side of her stomach and its swollen.  Patient said that the right side of her stomach is going down but the left side is not and it's making it difficult for her to stand up straight.  Patient noted that her left side is the same side where she had an infection.  Patient said that she is not experiencing fever, chills, or nausea.  Patient said that the incision is not open and she does not have any drainage.  Please call.

## 2020-06-11 DIAGNOSIS — E669 Obesity, unspecified: Secondary | ICD-10-CM | POA: Diagnosis not present

## 2020-06-11 DIAGNOSIS — J019 Acute sinusitis, unspecified: Secondary | ICD-10-CM | POA: Diagnosis not present

## 2020-06-11 DIAGNOSIS — J302 Other seasonal allergic rhinitis: Secondary | ICD-10-CM | POA: Diagnosis not present

## 2020-06-11 DIAGNOSIS — Z6832 Body mass index (BMI) 32.0-32.9, adult: Secondary | ICD-10-CM | POA: Diagnosis not present

## 2020-06-11 DIAGNOSIS — R7303 Prediabetes: Secondary | ICD-10-CM | POA: Diagnosis not present

## 2020-06-21 DIAGNOSIS — K219 Gastro-esophageal reflux disease without esophagitis: Secondary | ICD-10-CM | POA: Diagnosis not present

## 2020-06-21 DIAGNOSIS — K5904 Chronic idiopathic constipation: Secondary | ICD-10-CM | POA: Diagnosis not present

## 2020-06-21 DIAGNOSIS — M169 Osteoarthritis of hip, unspecified: Secondary | ICD-10-CM | POA: Diagnosis not present

## 2020-06-21 DIAGNOSIS — M609 Myositis, unspecified: Secondary | ICD-10-CM | POA: Diagnosis not present

## 2020-07-21 ENCOUNTER — Ambulatory Visit (INDEPENDENT_AMBULATORY_CARE_PROVIDER_SITE_OTHER): Payer: BC Managed Care – PPO | Admitting: Plastic Surgery

## 2020-07-21 ENCOUNTER — Ambulatory Visit: Payer: BC Managed Care – PPO

## 2020-07-21 ENCOUNTER — Other Ambulatory Visit: Payer: Self-pay

## 2020-07-21 DIAGNOSIS — Z411 Encounter for cosmetic surgery: Secondary | ICD-10-CM

## 2020-07-21 NOTE — Progress Notes (Signed)
Patient presents to discuss upcoming cosmetic surgery.  I previously did bilateral dogear excisions in the axilla, abdominoplasty, and bilateral medial thigh lift.  She did ultimately heal well from that.  She is overall happy with those results but would like her lower abdomen to be a bit flatter.  On exam she does have a bit of a transition between the abdominoplasty flap and the scar which has flattened out with time but could be a little flatter.  Otherwise her contour and shape in that area is good.  She has a nice result from her thighs.  And her upcoming procedure she is interested in an upper back lift to extend the dogear excision from the breast reductions around to the upper back to get rid of the upper back rolls.  She would like to have liposuction performed to the entire back with fat grafting into the buttock and into a right hip depression that occurred after her posterior approach hip arthroplasty.  At the same time I could extend to the scar from the abdominoplasty around posteriorly to make that area a bit more contoured and do a revision of her abdominoplasty scar.  We discussed the risks and benefits of this procedure that include bleeding, infection, damage to surrounding structures and need for additional procedures.  We discussed the potential for persistent contour irregularities and wound healing complications.  She is a good candidate for this operation we will plan to move forward.  We will plan to see her again before her surgery for definitive preoperative visit.

## 2020-07-23 DIAGNOSIS — M2550 Pain in unspecified joint: Secondary | ICD-10-CM | POA: Diagnosis not present

## 2020-07-23 DIAGNOSIS — E669 Obesity, unspecified: Secondary | ICD-10-CM | POA: Diagnosis not present

## 2020-07-23 DIAGNOSIS — K5904 Chronic idiopathic constipation: Secondary | ICD-10-CM | POA: Diagnosis not present

## 2020-07-23 DIAGNOSIS — M609 Myositis, unspecified: Secondary | ICD-10-CM | POA: Diagnosis not present

## 2020-07-24 ENCOUNTER — Other Ambulatory Visit: Payer: Self-pay

## 2020-07-24 ENCOUNTER — Ambulatory Visit
Admission: RE | Admit: 2020-07-24 | Discharge: 2020-07-24 | Disposition: A | Discharge status: Home / Self Care | Payer: BC Managed Care – PPO | Source: Ambulatory Visit | Attending: Internal Medicine | Admitting: Internal Medicine

## 2020-07-24 DIAGNOSIS — Z1231 Encounter for screening mammogram for malignant neoplasm of breast: Secondary | ICD-10-CM | POA: Diagnosis not present

## 2020-07-29 ENCOUNTER — Ambulatory Visit: Payer: BC Managed Care – PPO | Admitting: Plastic Surgery

## 2020-08-09 DIAGNOSIS — E669 Obesity, unspecified: Secondary | ICD-10-CM | POA: Diagnosis not present

## 2020-08-09 DIAGNOSIS — J302 Other seasonal allergic rhinitis: Secondary | ICD-10-CM | POA: Diagnosis not present

## 2020-08-09 DIAGNOSIS — Z6830 Body mass index (BMI) 30.0-30.9, adult: Secondary | ICD-10-CM | POA: Diagnosis not present

## 2020-08-09 DIAGNOSIS — R7303 Prediabetes: Secondary | ICD-10-CM | POA: Diagnosis not present

## 2020-08-09 DIAGNOSIS — K5904 Chronic idiopathic constipation: Secondary | ICD-10-CM | POA: Diagnosis not present

## 2020-09-01 ENCOUNTER — Ambulatory Visit (INDEPENDENT_AMBULATORY_CARE_PROVIDER_SITE_OTHER): Payer: Self-pay | Admitting: Plastic Surgery

## 2020-09-01 ENCOUNTER — Other Ambulatory Visit: Payer: Self-pay

## 2020-09-01 DIAGNOSIS — Z411 Encounter for cosmetic surgery: Secondary | ICD-10-CM

## 2020-09-01 NOTE — Progress Notes (Signed)
Patient presents to go over some questions regarding her upcoming surgery.  Overall we are planning to do back liposuction and an upper back lift basically extending her breast reduction scar around to her upper back to remove some of the folds that have occurred in that area.  We are also doing liposuction of the lateral posterior thighs in the saddlebag area and in the abdomen.  She does want me to process the fat into fill her buttock particularly on the right side where she has a depression from a hip arthroplasty procedure.  I will also revise her abdominoplasty scar as it has peaked a bit and in the central center area to try and improve that for her.  I answered her questions and we went over the details and I drew on her to anticipate where I would liposuction and where I would fill so that she can evaluate that.  Overall she satisfied and we will plan to move ahead with her surgery which is coming up in a few months.

## 2020-09-20 DIAGNOSIS — K219 Gastro-esophageal reflux disease without esophagitis: Secondary | ICD-10-CM | POA: Diagnosis not present

## 2020-09-20 DIAGNOSIS — J302 Other seasonal allergic rhinitis: Secondary | ICD-10-CM | POA: Diagnosis not present

## 2020-09-20 DIAGNOSIS — R7303 Prediabetes: Secondary | ICD-10-CM | POA: Diagnosis not present

## 2020-09-20 DIAGNOSIS — K5904 Chronic idiopathic constipation: Secondary | ICD-10-CM | POA: Diagnosis not present

## 2020-09-30 ENCOUNTER — Ambulatory Visit: Payer: Medicare HMO

## 2020-09-30 ENCOUNTER — Other Ambulatory Visit: Payer: Self-pay

## 2020-09-30 VITALS — BP 129/84 | HR 90 | Ht 67.0 in | Wt 203.0 lb

## 2020-09-30 DIAGNOSIS — Z411 Encounter for cosmetic surgery: Secondary | ICD-10-CM

## 2020-09-30 NOTE — Progress Notes (Unsigned)
Patient ID: Laurie [name] [facility], female    DOB: 07-Aug-1965, 55 y.o.   MRN: [id number]  Chief Complaint  Patient presents with   Pre-op Exam    No diagnosis found. Encounter for cosmetic pre-op  History of Present Illness: Laurie [name] is a 55 y.o.  female  with a history of S/P bilateral breast reduction by Dr. Ulice Bold & S/P abdominoplasty/thigh lift .  She presents for preoperative evaluation for upcoming procedure, liposuction/upper body posterior lift/liposuction to bilateral saddle bags/ fat grafting to right hip/bilateral buttocks/liposuction-pubis/revision of abdominoplasty scar , scheduled for 10/11/20 with Dr. Arita Miss.  The patient has not had problems with anesthesia.   Summary of Previous Visit: consult for cosmetic procedure  Job:   PMH Significant for:  Hx of the following: Varicose veins/lower extremity swelling BMI= 31.79 Gastric reflux Fibromyalgia Back pain Arthritis Gout neuropathy   Past Medical History: Allergies: Allergies  Allergen Reactions   Cipro [Ciprofloxacin Hcl] Swelling and Rash   Sulfa Antibiotics Swelling   Sulfasalazine Swelling   Sulfur Rash   Amoxicillin Rash    Current Medications:  Current Outpatient Medications:    allopurinol (ZYLOPRIM) 100 MG tablet, Take 100 mg by mouth daily., Disp: , Rfl:    Biotin 5 MG CAPS, Take 5 mg by mouth daily., Disp: , Rfl:    calcium-vitamin D (OSCAL WITH D) 500-200 MG-UNIT tablet, Take 1 tablet by mouth daily with breakfast., Disp: , Rfl:    doxepin (SINEQUAN) 10 MG capsule, Take 1 capsule (10 mg total) by mouth 3 (three) times daily., Disp: 21 capsule, Rfl: 0   DULoxetine (CYMBALTA) 30 MG capsule, Take 30 mg by mouth every morning., Disp: , Rfl:    fluconazole (DIFLUCAN) 150 MG tablet, , Disp: , Rfl:    folic acid (FOLVITE) 1 MG tablet, Take 1 mg by mouth daily. , Disp: , Rfl:    gabapentin (NEURONTIN) 300 MG capsule, Take 300 mg by mouth 2 (two) times daily., Disp: , Rfl:     LINZESS 145 MCG CAPS capsule, Take 145 mcg by mouth daily., Disp: , Rfl:    loratadine (CLARITIN) 10 MG tablet, Take 1 tablet (10 mg total) by mouth daily., Disp: 30 tablet, Rfl: 11   lubiprostone (AMITIZA) 24 MCG capsule, Take 24 mcg by mouth daily as needed for constipation., Disp: , Rfl:    metoprolol succinate (TOPROL-XL) 25 MG 24 hr tablet, Take 1 tablet (25 mg total) by mouth daily., Disp: 90 tablet, Rfl: 3   Multiple Vitamins-Minerals (MULTIVITAMINS THER. W/MINERALS) TABS, Take 1 tablet by mouth daily., Disp: , Rfl:    Probiotic Product (PROBIOTIC DAILY PO), Take by mouth., Disp: , Rfl:    senna (SENOKOT) 8.6 MG tablet, Take 3 tablets by mouth daily., Disp: , Rfl:    tiZANidine (ZANAFLEX) 4 MG tablet, Take 4 mg by mouth at bedtime as needed., Disp: , Rfl:    Wheat Dextrin (BENEFIBER DRINK MIX PO), Take 1 Scoop by mouth daily., Disp: , Rfl:    acetaminophen (TYLENOL) 500 MG tablet, Take 2 tablets (1,000 mg total) by mouth every 8 (eight) hours. (Patient not taking: Reported on 09/30/2020), Disp: 30 tablet, Rfl: 0   cetirizine (ZYRTEC) 10 MG tablet, Take 10 mg by mouth daily. (Patient not taking: Reported on 09/30/2020), Disp: , Rfl:    fluticasone (FLONASE) 50 MCG/ACT nasal spray, Place 2 sprays into both nostrils daily as needed for allergies. (Patient not taking: Reported on 09/30/2020), Disp: , Rfl:   Current Facility-Administered Medications:  0.9 %  sodium chloride infusion, 500 mL, Intravenous, Continuous, Pyrtle, Carie Caddy, MD  Past Medical Problems: Past Medical History:  Diagnosis Date   Acne vulgaris 01/19/15   Allergic rhinitis    Anemia    Anxiety    Blood transfusion without reported diagnosis    Borderline diabetes 11/14/11   Constipation    Eczematous dermatitis 11/08/2010   eczema   Endometriosis    Esophageal reflux    Fibromyalgia    Gout 02/08/12   idopathic gout, left ankle and foot   Gynecomastia 11/18/13   hypertrophy of breast   Hypertension    denies high  blood pressure on 03/23/20   Obesity, unspecified 07/04/05   Osteoarthritis    Right hip and knee   Paresthesias/numbness 11/14/11   Pre-diabetes    Premature menopause 07/09/13   Sinus tachycardia    Varicose veins of bilateral lower extremities with other complications 04/26/2015   Spider veins of both lower extremities with edema.    Past Surgical History: Past Surgical History:  Procedure Laterality Date   BREAST REDUCTION SURGERY  08/20/2019   Procedure: MAMMARY REDUCTION  (BREAST);  Surgeon: Peggye Form, DO;  Location: [facility];  Service: Plastics;;   Cardiac Event Monitor  03/2015   [facility]: Sinus rhythm with occasional sinus tachycardia. Rates as high as 122. No arrhythmias seen.   LIPOSUCTION  2014   TOTAL HIP ARTHROPLASTY Right 05/23/2006   TOTAL HIP ARTHROPLASTY Left 04/02/2018   Procedure: TOTAL HIP ARTHROPLASTY ANTERIOR APPROACH;  Surgeon: Durene Romans, MD;  Location: [facility];  Service: Orthopedics;  Laterality: Left;   TRANSTHORACIC ECHOCARDIOGRAM  03/02/2015   [facility], Dr. Sherril Croon: Normal LV size, mild-moderate concentric LVH with mild diffuse HK. EF~50%. Mild LA dilation. Could not exclude small ASD/PFO due to atrial septal dropout. Mild TR, mild MR.   TREADMILL STRESS ECHOCARDIOGRAM  07/2015   Exercised for 7 minutes-8.5 METS.  Terminated due to fatigue and reaching target heart rate -171 bpm which is under percent of expected).  No chest pain.  EKG negative for ischemia.  EF normal at baseline (55%) improved to 65% with exertion..  Baseline echo did not show evidence of LVH or R WMA.    Social History: Social History   Socioeconomic History   Marital status: Married    Spouse name: Not on file   Number of children: 0   Years of education: Not on file   Highest education level: Not on file  Occupational History   Occupation: [employer]  Tobacco Use   Smoking status: Never   Smokeless tobacco: Never  Vaping Use    Vaping Use: Never used  Substance and Sexual Activity   Alcohol use: No    Alcohol/week: 0.0 standard drinks   Drug use: No   Sexual activity: Not on file  Other Topics Concern   Not on file  Social History Narrative   Married now for 3 years. Mother of one.   This with her spouse. Is a Estate manager/land agent for [employer]   Does not drink does not smoke and does not use illicit drugs. Does not exercise.   Social Determinants of Health   Financial Resource Strain: Not on file  Food Insecurity: Not on file  Transportation Needs: Not on file  Physical Activity: Not on file  Stress: Not on file  Social Connections: Not on file  Intimate Partner Violence: Not on file    Family History: Family History  Problem  Relation Age of Onset   Healthy Father    Leukemia Mother    Heart failure Maternal Aunt    Breast cancer Maternal Aunt     Review of Systems: WNL  Physical Exam: Vital Signs BP 129/84 (BP Location: Left Arm, Patient Position: Sitting, Cuff Size: Large)   Pulse 90   Ht 5\' 7"  (1.702 m)   Wt 203 lb (92.1 kg)   LMP 07/14/2013 Comment: no periods >98yrs  SpO2 99%   BMI 31.79 kg/m   Physical Exam  Constitutional:      General: Not in acute distress.    Appearance: Normal appearance. Not ill-appearing.  HENT:     Head: Normocephalic and atraumatic.  Eyes:     Pupils: Pupils are equal, round Neck:     Musculoskeletal: Normal range of motion.  Cardiovascular:     Rate and Rhythm: Normal rate    Pulses: Normal pulses.  Pulmonary:     Effort: Pulmonary effort is normal. No respiratory distress.  Abdominal:     General: Abdomen is flat. There is no distension.  Musculoskeletal: Normal range of motion.  Skin:    General: Skin is warm and dry.     Findings: No erythema or rash.  Neurological:     General: No focal deficit present.     Mental Status: Alert and oriented to person, place, and time. Mental status is at baseline.     Motor: No weakness.   Psychiatric:        Mood and Affect: Mood normal.        Behavior: Behavior normal.    Assessment/Plan: The patient is scheduled for revision of abdominoplasty scar/lipo & fat grafting /posterior lift with Dr. 9yr.  Risks, benefits, and alternatives of procedure discussed, questions answered and consent obtained.    Smoking Status: does not smoke; Counseling Given? N/A Last Mammogram: 06/2020; Results: neg  Caprini Score: 4; Risk Factors include: , BMI =  31.79, and length of planned surgery. Recommendation for mechanical  prophylaxis. Encourage early ambulation.   Pictures obtained: @consult   Post-op Rx sent to pharmacy: - yes  Patient was provided with the  General Surgical Risk consent document and Pain Medication Agreement prior to their appointment.  They had adequate time to read through the risk consent documents and Pain Medication Agreement. We also discussed them in person together during this preop appointment. All of their questions were answered to their satisfaction.  Recommended calling if they have any further questions.  Risk consent form and Pain Medication Agreement to be scanned into patient's chart. Photos taken & entered into chart   Electronically signed by: 07/2020, RN 09/30/2020 12:17 PM

## 2020-09-30 NOTE — Patient Instructions (Signed)
Patient wll call for any changes or concerns

## 2020-10-01 DIAGNOSIS — Z719 Counseling, unspecified: Secondary | ICD-10-CM

## 2020-10-07 ENCOUNTER — Telehealth: Payer: Self-pay

## 2020-10-07 NOTE — Telephone Encounter (Signed)
Patient requested to speak with [facility] regarding her prescriptions. Please call to update [phone]. Thank you.

## 2020-10-08 NOTE — Telephone Encounter (Signed)
Called and spoke with the patient and informed her that I spoke with [facility], and he stated that he will call the medication for after surgery in for her on Monday.  Patient asked why on Monday and I informed her that the provider who will be sending in the medications is not here today.  Patient verbalized understanding and agreed.  Patient stated that she will need Oxycodone for pain because the Hydrocodone did not help with the pain after her last surgery.  She also stated that we will need to make sure of the antibiotic that is called in because of the reaction she had with the antibiotics during her last surgery as well.  Informed the patient that I will let the provider aware.  She verbalized understanding and agreed.//AB/CMA

## 2020-10-11 ENCOUNTER — Other Ambulatory Visit: Payer: Self-pay | Admitting: Surgical

## 2020-10-11 MED ORDER — ONDANSETRON HCL 4 MG PO TABS: 4.0000 mg | ORAL_TABLET | Freq: Three times a day (TID) | ORAL | 0 refills | Status: AC | PRN

## 2020-10-11 MED ORDER — OXYCODONE HCL 5 MG PO CAPS: 5.0000 mg | ORAL_CAPSULE | Freq: Four times a day (QID) | ORAL | 0 refills | Status: AC | PRN

## 2020-10-11 NOTE — Progress Notes (Signed)
Post op meds 

## 2020-10-12 ENCOUNTER — Ambulatory Visit (INDEPENDENT_AMBULATORY_CARE_PROVIDER_SITE_OTHER): Payer: Self-pay | Admitting: Plastic Surgery

## 2020-10-12 ENCOUNTER — Other Ambulatory Visit: Payer: Self-pay

## 2020-10-12 ENCOUNTER — Telehealth: Payer: Self-pay

## 2020-10-12 DIAGNOSIS — Z719 Counseling, unspecified: Secondary | ICD-10-CM

## 2020-10-12 NOTE — Progress Notes (Signed)
The patient is a 55 year old female here for follow-up after undergoing cosmetic surgery consisting of liposuction and Lipo filling.  The patient was concerned that her abdominal incision might be opening a little bit.  It looks really good there is a millimeter if that of separation of the skin.  I went ahead and put Dermabond on and fresh Steri-Strips.  Everything else looks bruised and swollen as expected.  I encouraged the patient to continue with hydration and Motrin for the swelling.

## 2020-10-12 NOTE — Telephone Encounter (Signed)
Patient called to say that the stitches in her stomach are coming loose.  She said that it's been open and bleeding all night.  I spoke with Carmel Sacramento, CMA, who was bringing someone to checkout while I was speaking with Bjorn Loser.  Dr. Arita Miss is in the OR so Angie will check with Keenan Bachelor, PA-C to see if he can see Lakara today.  I let Kaleigha know that we will check and call her right back.  Angie said that Susy Frizzle can see the patient today at 10am.  I called Novis back to let her know.  She said that she can come in at 10am today.

## 2020-10-12 NOTE — Telephone Encounter (Signed)
Patient was seen today in the office by Dr. Dillingham.//AB/CMA 

## 2020-10-20 ENCOUNTER — Encounter: Payer: Self-pay | Admitting: Plastic Surgery

## 2020-10-20 ENCOUNTER — Ambulatory Visit (INDEPENDENT_AMBULATORY_CARE_PROVIDER_SITE_OTHER): Payer: Medicare HMO | Admitting: Plastic Surgery

## 2020-10-20 ENCOUNTER — Other Ambulatory Visit: Payer: Self-pay

## 2020-10-20 DIAGNOSIS — Z411 Encounter for cosmetic surgery: Secondary | ICD-10-CM

## 2020-10-20 NOTE — Progress Notes (Signed)
Patient presents postop from bilateral upper back lift, back liposuction, fat grafting to the buttock and hips.  Saddlebag liposuction, and abdominal like to suction with revision of abdominoplasty scar.  She overall feels like she is doing well.  She has some soreness from the liposuction but nothing atypical.  On exam everything looks to be doing great.  Incisions are intact.  Contour looks good.  I think this will continue to improve as the swelling goes down.  We will plan to continue to have her wear compressive garments in the areas where we suctioned and I will see her again in 2 weeks.  All her questions were answered.

## 2020-11-03 ENCOUNTER — Other Ambulatory Visit: Payer: Self-pay

## 2020-11-03 ENCOUNTER — Ambulatory Visit (INDEPENDENT_AMBULATORY_CARE_PROVIDER_SITE_OTHER): Payer: Medicare HMO | Admitting: Plastic Surgery

## 2020-11-03 DIAGNOSIS — Z411 Encounter for cosmetic surgery: Secondary | ICD-10-CM

## 2020-11-03 NOTE — Progress Notes (Signed)
Patient presents postop from upper back lift, fat grafting to the buttock.  Thigh liposuction.  Revision of her abdominoplasty scar.  She is overall very happy.  There is a few subtle things with her contour that she would like to be a little bit better but overall is very happy.  On exam everything is healing appropriately.  No signs of any wound dehiscence or infection.  No subcutaneous fluid in any areas.  She has much improved correction of the contour her back and buttock.  We reviewed preoperative photographs today and she is in agreement with a fairly dramatic improvement.  We are planning to see her again in 4 to 6 weeks.  She is going to continue to wear compressive garments.  All her questions were answered.

## 2020-11-04 ENCOUNTER — Telehealth: Payer: Self-pay | Admitting: Plastic Surgery

## 2020-11-04 NOTE — Telephone Encounter (Signed)
Called patient to advise that I received a message from the manager at [facility] and she advised that patient was overcharged and they would refund patient any overage. Patient had revision to previous surgery and should have received 25% off 1 hour of OR time and her quote should have been honored due to the time frame in which her surgery was booked. Patient stated she had also heard from them.

## 2020-11-11 ENCOUNTER — Telehealth: Payer: Self-pay | Admitting: Plastic Surgery

## 2020-11-11 NOTE — Telephone Encounter (Signed)
Called patient to advise her that we are unable to provide the cpt codes for cosmetic surgeries so UB04 or CMS1500 forms. Patient called Laurie [name] who is assisting her at her insurance company to get reimbursed. We left a message for Laurie [name].   Laurie [name] called back about 5 minutes later. She informed me that she is looking for some form of itemized statement showing the procedure, the charge for each item/time, how much patient paid, etc. I explained to her that because the surgery is cosmetic we draft a quote based off the amount hours required for the surgery and the fees associated with that surgery.Advd the patient pays [place] the provider fee and pays the OR and Anesthesia fees to the [facility]. I advised her that we would provide a receipt for the patient but it would not state what the money was for as our system does not have the fees built in. Advised we could possibly write down what the payment to our office was for. Provided Laurie [name] with the phone number for [facility] to check to see if they have a itemized break down they can provide her. She stated understanding and will follow up with [facility].

## 2020-12-16 ENCOUNTER — Other Ambulatory Visit: Payer: Self-pay

## 2020-12-16 ENCOUNTER — Ambulatory Visit (INDEPENDENT_AMBULATORY_CARE_PROVIDER_SITE_OTHER): Payer: Self-pay | Admitting: Plastic Surgery

## 2020-12-16 DIAGNOSIS — Z411 Encounter for cosmetic surgery: Secondary | ICD-10-CM

## 2020-12-16 NOTE — Progress Notes (Signed)
Patient presents postop from upper back lift combined with back liposuction and fat grafting to the buttocks.  We also revised her abdominoplasty scar.  She overall is satisfied and feels like things are going well.  There is a few areas that have drawn her attention.  Laterally she feels that the scar is folding and indenting in at the lateral aspect of her abdominoplasty incision.  Posteriorly she feels that there is a bit more fullness than she would like in the lower back.  I explained in my opinion we should give the lateral scar some more time to soften and hopefully they will relax.  The lower back is a little trickier as it was liposuction pretty aggressively.  Hopefully this will improve as it has only been 2 months since her surgery.  We will plan to see her again in a few months to reevaluate both those areas.  All of her questions were answered.

## 2021-01-25 ENCOUNTER — Encounter: Payer: Self-pay | Admitting: *Deleted

## 2021-02-22 ENCOUNTER — Other Ambulatory Visit: Payer: Self-pay | Admitting: Internal Medicine

## 2021-02-22 LAB — COMPLETE METABOLIC PANEL WITH GFR
AG Ratio: 1.4 (calc) (ref 1.0–2.5)
ALT: 13 U/L (ref 6–29)
AST: 18 U/L (ref 10–35)
Albumin: 4.1 g/dL (ref 3.6–5.1)
Alkaline phosphatase (APISO): 86 U/L (ref 37–153)
BUN: 15 mg/dL (ref 7–25)
CO2: 23 mmol/L (ref 20–32)
Calcium: 8.6 mg/dL (ref 8.6–10.4)
Chloride: 105 mmol/L (ref 98–110)
Creat: 0.91 mg/dL (ref 0.50–1.03)
Globulin: 2.9 g/dL (calc) (ref 1.9–3.7)
Glucose, Bld: 75 mg/dL (ref 65–99)
Potassium: 4.4 mmol/L (ref 3.5–5.3)
Sodium: 139 mmol/L (ref 135–146)
Total Bilirubin: 0.5 mg/dL (ref 0.2–1.2)
Total Protein: 7 g/dL (ref 6.1–8.1)
eGFR: 75 mL/min/{1.73_m2} (ref >=60)

## 2021-02-22 LAB — VITAMIN D 25 HYDROXY (VIT D DEFICIENCY, FRACTURES): Vit D, 25-Hydroxy: 38 ng/mL (ref 30–100)

## 2021-02-22 LAB — FOLATE: Folate: >24 ng/mL

## 2021-02-22 LAB — CBC
HCT: 39 % (ref 35.0–45.0)
Hemoglobin: 12.6 g/dL (ref 11.7–15.5)
MCH: 25.4 pg — ABNORMAL LOW (ref 27.0–33.0)
MCHC: 32.3 g/dL (ref 32.0–36.0)
MCV: 78.5 fL — ABNORMAL LOW (ref 80.0–100.0)
MPV: 10.3 fL (ref 7.5–12.5)
Platelets: 300 10*3/uL (ref 140–400)
RBC: 4.97 10*6/uL (ref 3.80–5.10)
RDW: 14.9 % (ref 11.0–15.0)
WBC: 6.6 10*3/uL (ref 3.8–10.8)

## 2021-02-22 LAB — TSH: TSH: 1.29 mIU/L

## 2021-02-22 LAB — VITAMIN B12: Vitamin B-12: >2000 pg/mL — ABNORMAL HIGH (ref 200–1100)

## 2021-02-22 LAB — LIPID PANEL
Cholesterol: 179 mg/dL (ref <200)
HDL: 52 mg/dL (ref >=50)
LDL Cholesterol (Calc): 107 mg/dL (calc) — ABNORMAL HIGH
Non-HDL Cholesterol (Calc): 127 mg/dL (calc) (ref <130)
Total CHOL/HDL Ratio: 3.4 (calc) (ref <5.0)
Triglycerides: 108 mg/dL (ref <150)

## 2021-02-22 LAB — RPR: RPR Ser Ql: NONREACTIVE

## 2021-02-22 LAB — HIV ANTIBODY (ROUTINE TESTING W REFLEX): HIV 1&2 Ab, 4th Generation: NONREACTIVE

## 2021-03-01 ENCOUNTER — Other Ambulatory Visit: Payer: Self-pay | Admitting: Internal Medicine

## 2021-03-01 ENCOUNTER — Telehealth: Payer: Self-pay | Admitting: *Deleted

## 2021-03-01 ENCOUNTER — Encounter: Payer: Self-pay | Admitting: Internal Medicine

## 2021-03-01 ENCOUNTER — Ambulatory Visit (INDEPENDENT_AMBULATORY_CARE_PROVIDER_SITE_OTHER): Payer: Medicare HMO | Admitting: Internal Medicine

## 2021-03-01 VITALS — BP 130/86 | HR 91 | Ht 67.0 in | Wt 202.0 lb

## 2021-03-01 DIAGNOSIS — K5904 Chronic idiopathic constipation: Secondary | ICD-10-CM | POA: Diagnosis not present

## 2021-03-01 MED ORDER — TRULANCE 3 MG PO TABS: 1.0000 | ORAL_TABLET | Freq: Every day | ORAL | 2 refills | Status: AC

## 2021-03-01 NOTE — Telephone Encounter (Signed)
We have received a response from [facility] indicating... "The drug you asked for is not listed in your preferred drug list (formulary). The preferred drugs you may have not tried are: lactulose oral solution. Your provider needs to give [place] medical reasons why the preferred drug would not work for you and or would have bad side effects..."  In the PA request, I have already noted that patient has tried and failed Amitiza, Linzess, Miralax, senna and fiber for her CIC.  Please advise.Marland KitchenMarland Kitchen

## 2021-03-01 NOTE — Patient Instructions (Signed)
Please discontinue Linzess.  We have sent the following medications to your pharmacy for you to pick up at your convenience: Trulance 3 mg daily  Continue Miralax daily.  Back off off the fiber for now.  Please purchase the following medications over the counter and take as directed: Senna if needed  If you are age 55 or older, your body mass index should be between 23-30. Your Body mass index is 31.64 kg/m. If this is out of the aforementioned range listed, please consider follow up with your Primary Care Provider.  If you are age 19 or younger, your body mass index should be between 19-25. Your Body mass index is 31.64 kg/m. If this is out of the aformentioned range listed, please consider follow up with your Primary Care Provider.   ________________________________________________________  The [facility] providers would like to encourage you to use [facility] to communicate with providers for non-urgent requests or questions.  Due to long hold times on the telephone, sending your provider a message by [facility] may be a faster and more efficient way to get a response.  Please allow 48 business hours for a response.  Please remember that this is for non-urgent requests.  _______________________________________________________ Due to recent changes in healthcare laws, you may see the results of your imaging and laboratory studies on MyChart before your provider has had a chance to review them.  We understand that in some cases there may be results that are confusing or concerning to you. Not all laboratory results come back in the same time frame and the provider may be waiting for multiple results in order to interpret others.  Please give [place] 48 hours in order for your provider to thoroughly review all the results before contacting the office for clarification of your results.

## 2021-03-01 NOTE — Progress Notes (Signed)
° °  Subjective:    Patient ID: Laurie [name], female    DOB: [birth year], 56 y.o.   MRN: [id number]  HPI Laurie [name] is a 56 year old female with a history of chronic constipation, hypertension, gout, fibromyalgia, osteoarthritis, fairly recent breast reduction and tummy tuck who is here for follow-up.  She is here alone today.  She was last seen in the office in January 2022 by Alcide Evener, NP and for screening colonoscopy in February 2022.  Colonoscopy to the terminal ileum with good prep was basically normal.  There was mild melanosis in the right colon and transverse colon.  Small internal hemorrhoids were seen.  She reports she continues to struggle with severe constipation.  She has bloating, nausea.  Abdominal fullness and pressure.  She has previously tried Amitiza but most recently has been using Linzess 290 mcg daily, MiraLAX 17 g 2 doses at night, Benefiber mixed with water throughout the day and occasionally 3-4 senna per day.  She also will use Dulcolax 1 day a week or so typically 5 tablets to "cleanse myself".  She also takes milk thistle and a colon probiotic.  Despite all of this she continues to have constipation and abdominal discomfort/bloating/gas.  No upper GI or hepatobiliary complaint.   Review of Systems As per HPI, otherwise negative  Current Medications, Allergies, Past Medical History, Past Surgical History, Family History and Social History were reviewed in [employer] record.    Objective:   Physical Exam BP 130/86    Pulse 91    Ht 5\' 7"  (1.702 m)    Wt 202 lb (91.6 kg)    LMP 07/14/2013 Comment: no periods >46yrs   BMI 31.64 kg/m  Gen: awake, alert, NAD HEENT: anicteric CV: RRR, no mrg Pulm: CTA b/l Abd: soft, NT/ND, bowel sounds are quiet Ext: no c/c/e Neuro: nonfocal      Assessment & Plan:    56 year old female with a history of chronic constipation, hypertension, gout, fibromyalgia, osteoarthritis, fairly  recent breast reduction and tummy tuck who is here for follow-up.   1.  Chronic idiopathic constipation --infrequent bowel movement with obstipation type symptoms despite multiple laxatives.  Previous tried and failed Amitiza.  Linzess 290 ineffective despite the use of MiraLAX, senna, fiber.  Occasional Dulcolax still being used. --Discontinue Linzess --Begin Trulance 3 mg once daily --She can continue MiraLAX 17 g 1-2 times a day --Senna can still be used but should be a backup now.  Ideally she would only need Trulance and perhaps MiraLAX.  If ineffective I asked that she send me a MyChart message or call the office.  We could next try Movantik if the above ineffective.  2.  CRC screening --no history of adenomatous colon polyps.  Last colonoscopy February 2022 without polyps.  Screening next due February 2032.  30 minutes total spent today including patient facing time, coordination of care, reviewing medical history/procedures/pertinent radiology studies, and documentation of the encounter.

## 2021-03-02 ENCOUNTER — Ambulatory Visit (INDEPENDENT_AMBULATORY_CARE_PROVIDER_SITE_OTHER): Payer: Self-pay | Admitting: Plastic Surgery

## 2021-03-02 ENCOUNTER — Other Ambulatory Visit: Payer: Self-pay

## 2021-03-02 DIAGNOSIS — Z411 Encounter for cosmetic surgery: Secondary | ICD-10-CM

## 2021-03-02 LAB — HEPATITIS PANEL, ACUTE
Hep A IgM: NONREACTIVE
Hep B C IgM: NONREACTIVE
Hepatitis B Surface Ag: NONREACTIVE
Hepatitis C Ab: NONREACTIVE
SIGNAL TO CUT-OFF: 0.06 (ref <1.00)

## 2021-03-02 LAB — T3: T3, Total: 112 ng/dL (ref 76–181)

## 2021-03-02 LAB — T4, FREE: Free T4: 1.2 ng/dL (ref 0.8–1.8)

## 2021-03-02 LAB — PROTIME-INR
INR: 1
Prothrombin Time: 10 s (ref 9.0–11.5)

## 2021-03-02 LAB — TSH: TSH: 1.6 mIU/L

## 2021-03-02 LAB — HCG, SERUM, QUALITATIVE: Preg, Serum: NEGATIVE

## 2021-03-02 LAB — T3 UPTAKE: T3 Uptake: 30 % (ref 22–35)

## 2021-03-02 LAB — EXTRA LAV TOP TUBE

## 2021-03-02 NOTE — Progress Notes (Signed)
Patient presents to follow-up after multiple cosmetic operations.  In total I have done liposuction of her neck, upper back lift, abdominoplasty which I did revise at her second surgery to improve the infraumbilical contour, a medial upper thigh lift, along with liposuction of her back and fat grafting to the buttock.  She does have a depression in her right hip from a posterior approach right hip arthroplasty that we tried to fill with fat and although I do think it somewhat better she still feels that there is a depression on that side.  She is also bothered by some residual saddlebag in the left lateral thigh.  Her main concern is excess tissue in the lower back.  We had discussed a number of times the potential for circumferential abdominoplasty or lower body lift but have not moved forward with it for various reasons.  I do not think it would have been beneficial to do an upper body lift and a lower body lift at the same time and she has lost weight which has generated some excess skin redundancy in the lower back.  She has some small amount of wrinkling going on at the lateral aspect of her abdominoplasty incision which I do think would be improved with taking the scar circumferentially.  She is interested in a third operation for what would essentially be a lower body lift combined with some touchup liposuction of the left saddlebag and potentially some fat grafting into the right hip depression from her arthroplasty.  I do think that it is reasonable to consider this in her case and would address some of her concerns.  We reviewed risks and benefits of the procedure and we will plan to provide a quote for her so she can think about what she wants to do.  All of her questions were answered.

## 2021-03-03 NOTE — Telephone Encounter (Signed)
Lactulose is not preferred due to abdominal bloating, this is currently a symptom for her making this less than ideal over concern of side effect. My opinion on lactulose should help get Trulance approved Let me know if otherwise

## 2021-03-04 ENCOUNTER — Ambulatory Visit: Payer: Medicare HMO | Admitting: Internal Medicine

## 2021-03-04 LAB — EXTRA SPECIMEN

## 2021-03-04 LAB — CP4508-PT/INR AND PTT

## 2021-03-04 NOTE — Telephone Encounter (Signed)
Appeal letter created and faxed to insurance company at fax [phone]. We will await response.

## 2021-03-11 NOTE — Telephone Encounter (Signed)
We have received an approval to the appeal sent to [facility] regarding patient's Trulance.   Fax received 03/08/21-- Approval is good from 01/30/21-01/29/22.  Member ID# [id number] Reference #[id number]

## 2021-03-14 ENCOUNTER — Telehealth: Payer: Self-pay | Admitting: Cardiology

## 2021-03-14 ENCOUNTER — Encounter: Payer: Self-pay | Admitting: Cardiology

## 2021-03-14 NOTE — Telephone Encounter (Signed)
Called pt to get more information about this message. No answer, left message for her to return the call.

## 2021-03-14 NOTE — Telephone Encounter (Signed)
See telephone encounter.

## 2021-03-14 NOTE — Telephone Encounter (Signed)
Patient called in to say that she needs a letter that say dr harding has release her. Please advise

## 2021-03-15 NOTE — Telephone Encounter (Signed)
Patient states she needs a cardiac clearance letter.

## 2021-03-16 NOTE — Telephone Encounter (Signed)
Called patient, she said that she needed a letter that she was "released" with no cardiac issues. I advised I would have to get the okay from Dr.Harding, she states she needs it to be that she was seen in 2021, and released. I seen that disability forms were not completed but she states she still needs a letter stating she is cleared from cardiac standpoint.  Will route to MD/Nurse.

## 2021-03-16 NOTE — Telephone Encounter (Signed)
I am not sure what she is asking.  I have not seen her in almost 3 years.  She was seen by Bailey Mech in May of 2021.  The plan for each of those visits was an annual follow-up.  I am not sure what hospital sustained a letter for patient to have not seen in over 3 years.  What is she needs a letter for?    The only thing that was actively going on when she was last seen was that she was on beta-blocker for palpitations.  If someone is taking over prescribing the beta-blocker, then I am fine clearing her but I just need to know what we are "clearing her/releasing her "for.  Bryan Lemma, MD

## 2021-03-18 NOTE — Telephone Encounter (Signed)
Spoke with patient. She needs a clearance letter stating she isno under any cardiac care . She is having cosmetic surgery in [place], [place].  The surgical center  will not send any clearance form to office . They require patient to obtain all material. RN informed patient will  send message to Dr Ellyn Hack for letter

## 2021-03-18 NOTE — Telephone Encounter (Signed)
Patient is returning call to talk to Ivin Booty, Dr, Harding's RN

## 2021-03-21 NOTE — Telephone Encounter (Signed)
Pt is calling back in to check this status of this clearance.  She stated her paperwork has to be in asap.. She is just needing a letter that she was released   Best number [phone]

## 2021-03-21 NOTE — Telephone Encounter (Signed)
Called patient . Informed patient RN discussed with Dr Herbie Baltimore. Letter  obtained.   Patient will given email and fax number to signed clearance letter.

## 2021-03-22 NOTE — Telephone Encounter (Signed)
Spoke with patient .  Letter emailed to patient ( per patient request) and faxed to  ([facility] ) Dr Josephine Cables  [phone]    Once above completed . RN called patient and confirmed patient received  e-mail  She verbalized that she did receive e-mail.

## 2021-03-29 ENCOUNTER — Telehealth: Payer: Self-pay

## 2021-03-29 NOTE — Telephone Encounter (Signed)
LMVM, inquired if she wants to schedule surgery.

## 2021-07-08 DIAGNOSIS — R200 Anesthesia of skin: Secondary | ICD-10-CM | POA: Diagnosis not present

## 2021-07-08 DIAGNOSIS — M609 Myositis, unspecified: Secondary | ICD-10-CM | POA: Diagnosis not present

## 2021-07-08 DIAGNOSIS — R7303 Prediabetes: Secondary | ICD-10-CM | POA: Diagnosis not present

## 2021-07-08 DIAGNOSIS — K5904 Chronic idiopathic constipation: Secondary | ICD-10-CM | POA: Diagnosis not present

## 2021-07-08 DIAGNOSIS — Z6830 Body mass index (BMI) 30.0-30.9, adult: Secondary | ICD-10-CM | POA: Diagnosis not present

## 2021-07-08 DIAGNOSIS — J302 Other seasonal allergic rhinitis: Secondary | ICD-10-CM | POA: Diagnosis not present

## 2021-07-08 DIAGNOSIS — E669 Obesity, unspecified: Secondary | ICD-10-CM | POA: Diagnosis not present

## 2021-08-22 ENCOUNTER — Other Ambulatory Visit: Payer: Self-pay | Admitting: Internal Medicine

## 2021-08-22 DIAGNOSIS — Z1231 Encounter for screening mammogram for malignant neoplasm of breast: Secondary | ICD-10-CM

## 2021-09-13 ENCOUNTER — Ambulatory Visit
Admission: RE | Admit: 2021-09-13 | Discharge: 2021-09-13 | Disposition: A | Discharge status: Home / Self Care | Payer: Medicare HMO | Source: Ambulatory Visit | Attending: Internal Medicine | Admitting: Internal Medicine

## 2021-09-13 DIAGNOSIS — Z1231 Encounter for screening mammogram for malignant neoplasm of breast: Secondary | ICD-10-CM

## 2021-12-20 ENCOUNTER — Other Ambulatory Visit: Payer: Self-pay | Admitting: Obstetrics and Gynecology

## 2021-12-20 DIAGNOSIS — N6489 Other specified disorders of breast: Secondary | ICD-10-CM

## 2021-12-27 ENCOUNTER — Ambulatory Visit: Payer: Medicare HMO | Admitting: Plastic Surgery

## 2022-01-13 ENCOUNTER — Ambulatory Visit: Payer: Medicare HMO | Admitting: Plastic Surgery

## 2022-01-17 ENCOUNTER — Ambulatory Visit: Payer: Medicare HMO | Admitting: Plastic Surgery

## 2022-01-17 ENCOUNTER — Other Ambulatory Visit: Payer: Medicare HMO

## 2022-01-24 ENCOUNTER — Telehealth: Payer: Medicare HMO | Admitting: Plastic Surgery

## 2022-03-22 ENCOUNTER — Ambulatory Visit: Payer: Medicare HMO

## 2022-03-22 ENCOUNTER — Ambulatory Visit
Admission: RE | Admit: 2022-03-22 | Discharge: 2022-03-22 | Disposition: A | Discharge status: Home / Self Care | Payer: Medicare HMO | Source: Ambulatory Visit | Attending: Obstetrics and Gynecology | Admitting: Obstetrics and Gynecology

## 2022-03-22 DIAGNOSIS — N6489 Other specified disorders of breast: Secondary | ICD-10-CM

## 2022-08-31 ENCOUNTER — Other Ambulatory Visit: Payer: Self-pay | Admitting: Internal Medicine

## 2022-08-31 DIAGNOSIS — Z1231 Encounter for screening mammogram for malignant neoplasm of breast: Secondary | ICD-10-CM

## 2022-09-15 ENCOUNTER — Ambulatory Visit: Payer: Medicare HMO

## 2022-09-28 ENCOUNTER — Ambulatory Visit
Admission: RE | Admit: 2022-09-28 | Discharge: 2022-09-28 | Disposition: A | Discharge status: Home / Self Care | Payer: Medicare HMO | Source: Ambulatory Visit | Attending: Internal Medicine | Admitting: Internal Medicine

## 2022-09-28 DIAGNOSIS — Z1231 Encounter for screening mammogram for malignant neoplasm of breast: Secondary | ICD-10-CM

## 2022-12-20 NOTE — Telephone Encounter (Signed)
Error

## 2023-07-04 ENCOUNTER — Other Ambulatory Visit: Payer: Self-pay | Admitting: Internal Medicine

## 2023-07-04 DIAGNOSIS — Z1231 Encounter for screening mammogram for malignant neoplasm of breast: Secondary | ICD-10-CM

## 2023-10-08 ENCOUNTER — Ambulatory Visit
Admission: RE | Admit: 2023-10-08 | Discharge: 2023-10-08 | Disposition: A | Discharge status: Home / Self Care | Source: Ambulatory Visit | Attending: Internal Medicine | Admitting: Internal Medicine

## 2023-10-08 DIAGNOSIS — Z1231 Encounter for screening mammogram for malignant neoplasm of breast: Secondary | ICD-10-CM
